# Patient Record
Sex: Female | Born: 1935 | Race: White | Hispanic: No | Smoking: Never smoker
Health system: Southern US, Community
[De-identification: ages and names within clinical notes are randomized; demographics above are authoritative.]

## PROBLEM LIST (undated history)

## (undated) DIAGNOSIS — K219 Gastro-esophageal reflux disease without esophagitis: Secondary | ICD-10-CM

## (undated) DIAGNOSIS — J8410 Pulmonary fibrosis, unspecified: Secondary | ICD-10-CM

## (undated) DIAGNOSIS — M069 Rheumatoid arthritis, unspecified: Secondary | ICD-10-CM

## (undated) DIAGNOSIS — E785 Hyperlipidemia, unspecified: Secondary | ICD-10-CM

## (undated) DIAGNOSIS — E119 Type 2 diabetes mellitus without complications: Secondary | ICD-10-CM

## (undated) DIAGNOSIS — C801 Malignant (primary) neoplasm, unspecified: Secondary | ICD-10-CM

## (undated) HISTORY — DX: Gastro-esophageal reflux disease without esophagitis: K21.9

## (undated) HISTORY — DX: Malignant (primary) neoplasm, unspecified: C80.1

## (undated) HISTORY — DX: Pulmonary fibrosis, unspecified: J84.10

## (undated) HISTORY — DX: Hyperlipidemia, unspecified: E78.5

---

## 1958-12-28 HISTORY — PX: APPENDECTOMY: SHX54

## 1999-02-04 ENCOUNTER — Ambulatory Visit (HOSPITAL_COMMUNITY): Admission: RE | Admit: 1999-02-04 | Discharge: 1999-02-04 | Payer: Self-pay | Admitting: Internal Medicine

## 1999-05-14 ENCOUNTER — Encounter: Payer: Self-pay | Admitting: Internal Medicine

## 1999-05-14 ENCOUNTER — Encounter: Admission: RE | Admit: 1999-05-14 | Discharge: 1999-05-14 | Payer: Self-pay | Admitting: Internal Medicine

## 2004-03-04 ENCOUNTER — Other Ambulatory Visit: Admission: RE | Admit: 2004-03-04 | Discharge: 2004-03-04 | Payer: Self-pay | Admitting: Internal Medicine

## 2004-06-07 ENCOUNTER — Encounter: Admission: RE | Admit: 2004-06-07 | Discharge: 2004-06-07 | Payer: Self-pay | Admitting: Internal Medicine

## 2005-06-27 ENCOUNTER — Encounter: Admission: RE | Admit: 2005-06-27 | Discharge: 2005-06-27 | Payer: Self-pay | Admitting: Internal Medicine

## 2006-06-30 ENCOUNTER — Encounter: Admission: RE | Admit: 2006-06-30 | Discharge: 2006-06-30 | Payer: Self-pay | Admitting: Internal Medicine

## 2008-08-28 ENCOUNTER — Encounter: Admission: RE | Admit: 2008-08-28 | Discharge: 2008-08-28 | Payer: Self-pay | Admitting: Internal Medicine

## 2008-09-19 ENCOUNTER — Encounter: Payer: Self-pay | Admitting: Internal Medicine

## 2008-09-26 ENCOUNTER — Encounter: Admission: RE | Admit: 2008-09-26 | Discharge: 2008-09-26 | Payer: Self-pay | Admitting: Allergy and Immunology

## 2008-09-28 ENCOUNTER — Encounter: Admission: RE | Admit: 2008-09-28 | Discharge: 2008-09-28 | Payer: Self-pay | Admitting: Allergy and Immunology

## 2008-10-12 ENCOUNTER — Ambulatory Visit: Payer: Self-pay | Admitting: Internal Medicine

## 2008-10-12 DIAGNOSIS — J8410 Pulmonary fibrosis, unspecified: Secondary | ICD-10-CM

## 2008-10-12 DIAGNOSIS — R059 Cough, unspecified: Secondary | ICD-10-CM | POA: Insufficient documentation

## 2008-10-12 DIAGNOSIS — R05 Cough: Secondary | ICD-10-CM

## 2008-10-13 ENCOUNTER — Telehealth: Payer: Self-pay | Admitting: Internal Medicine

## 2008-11-09 ENCOUNTER — Ambulatory Visit: Payer: Self-pay | Admitting: Internal Medicine

## 2009-05-24 ENCOUNTER — Ambulatory Visit: Payer: Self-pay | Admitting: Internal Medicine

## 2009-07-26 ENCOUNTER — Ambulatory Visit: Payer: Self-pay | Admitting: Adult Health

## 2009-07-26 ENCOUNTER — Ambulatory Visit: Payer: Self-pay | Admitting: Internal Medicine

## 2009-08-09 ENCOUNTER — Ambulatory Visit: Payer: Self-pay | Admitting: Internal Medicine

## 2009-08-09 DIAGNOSIS — R0609 Other forms of dyspnea: Secondary | ICD-10-CM

## 2009-08-09 DIAGNOSIS — R0989 Other specified symptoms and signs involving the circulatory and respiratory systems: Secondary | ICD-10-CM | POA: Insufficient documentation

## 2009-08-09 DIAGNOSIS — J9610 Chronic respiratory failure, unspecified whether with hypoxia or hypercapnia: Secondary | ICD-10-CM

## 2009-08-10 LAB — CONVERTED CEMR LAB
Pro B Natriuretic peptide (BNP): 68 pg/mL (ref 0.0–100.0)
Sed Rate: 13 mm/hr (ref 0–22)

## 2009-09-06 ENCOUNTER — Ambulatory Visit: Payer: Self-pay | Admitting: Internal Medicine

## 2009-09-06 DIAGNOSIS — K219 Gastro-esophageal reflux disease without esophagitis: Secondary | ICD-10-CM | POA: Insufficient documentation

## 2009-10-23 ENCOUNTER — Telehealth: Payer: Self-pay | Admitting: Internal Medicine

## 2009-10-25 ENCOUNTER — Ambulatory Visit: Payer: Self-pay | Admitting: Internal Medicine

## 2010-05-19 ENCOUNTER — Encounter: Payer: Self-pay | Admitting: Internal Medicine

## 2010-05-28 NOTE — Assessment & Plan Note (Signed)
Summary: Acute NP office visit - cough   Copy to:  Dr. Neldon Mc Primary Provider/Referring Provider:  Dr Mertha Finders  CC:  still coughing, prod in the mornings with clear mucus and dry throughout the day, and and dyspnea w/ activity and anxiety.  states the cough is intermittent.  she is taking the nexium and states this helps little.  History of Present Illness: 59 yowf never smoker mother of a [place] PA with h/o cough onset around 2005 with a pattern showing scarring per Dr Inda Merlin office records.  October 12, 2008 ov at request of Kozlow with worse cough winter 2010 and after prednisone seemed better except  still cough at hs.  completed 2 days before ov. also noted doe proportionate to cough which is mostly dry.  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 rec Please schedule a follow-up appointment in 1 month with any old chest xrays dating back 5 years with PFT's same day Stop fish oil continue omeprazole but take one 30-60 min before first meal of the day  for cough try delsym 2 tsp every 12   November 09, 2008 cough much better, doe minimal improvement certainly no worse. no sign arthritis or dysphagia.  rec continue omeprazole Take  one 30-60 min before first meal of the day  Please schedule a follow-up appointment in 3 months with CXR sooner if breathing or cough worsen NO MACRODANTIN  September 2010 flew to [place] one month after landed increase cough esp at bedtime better after rx which included European version of symbicort which is dpi.  Back to baseline doe, no cough.    04/2009--follow up. rx Nexium 42m once daily   .July 26, 2009 --Presents for acute office visit. Complains of still coughing, prod in the mornings with clear mucus and dry throughout the day,and dyspnea w/ activity and anxiety.  states the cough is intermittent.  she is taking the nexium and states this helps little.  Breathing is not as good as it has been. Cough is dry and intermittent.  Voice shaky. She has had on/off cough that has good and bad days, worse for last 2 weeks. Denies chest pain, dyspnea, orthopnea, hemoptysis, fever, n/v/d, edema, headache.   Preventive Screening-Counseling & Management  Alcohol-Tobacco     Smoking Status: never  Medications Prior to Update: 1)  Aspirin 81 Mg Tbec (Aspirin) ..Marland Kitchen. 1 Once Daily 2)  Sudafed 30 Mg Tabs (Pseudoephedrine Hcl) .... As Directed As Needed 3)  Symbicort 320/9 Mg ..Marland Kitchen. 1 Puff Two Times A Day 4)  Nexium 40 Mg Cpdr (Esomeprazole Magnesium) .... Take  One 30-60 Min Before First Meal of The Day 5)  Vicks Tabs .... As Needed  Current Medications (verified): 1)  Aspirin 81 Mg Tbec (Aspirin) ..Marland Kitchen. 1 Once Daily 2)  Sudafed 30 Mg Tabs (Pseudoephedrine Hcl) .... As Directed As Needed 3)  Symbicort 160-4.5 Mcg/act Aero (Budesonide-Formoterol Fumarate) .... Inhale 2 Puffs Two Times A Day 4)  Nexium 40 Mg Cpdr (Esomeprazole Magnesium) .... Take  One 30-60 Min Before First Meal of The Day 5)  Vicks Tabs .... As Needed  Allergies (verified): No Known Drug Allergies  Past History:  Past Medical History: Last updated: 05/24/2009 Pulmonary Fibrosis.................................................Marland Kitchenert     - PFT's November 09, 2008 VC 2.03 ( 92%) DLC0 52%     - Ex desat documented 11/09/08  Hyperlipidemia GERD  Family History: Last updated: 10/12/2008 Heart dz- Brother Neg PF  Social History: Last updated: 10/12/2008 Widowed Children  Retired- states that she was exposed to saw dust x 35 years. Never smoker No ETOH  Social History: Smoking Status:  never  Review of Systems      See HPI  Vital Signs:  Patient profile:   77 year old female Height:      60 inches Weight:      144.38 pounds BMI:     28.30 O2 Sat:      97 % on Room air Temp:     97.5 degrees F oral Pulse rate:   67 / minute BP sitting:   152 / 82  (left arm) Cuff size:   regular  Vitals Entered By: Parke Poisson CNA (July 26, 2009 10:46 AM)  O2  Flow:  Room air CC: still coughing, prod in the mornings with clear mucus and dry throughout the day,and dyspnea w/ activity and anxiety.  states the cough is intermittent.  she is taking the nexium and states this helps little Is Patient Diabetic? No Comments Medications reviewed with patient Daytime contact number verified with patient. Parke Poisson CNA  July 26, 2009 10:47 AM    Physical Exam  Additional Exam:  pleasant wf with strong [place] accent nad  wt 141 > 140 November 09, 2008 > 141 May 24, 2009 >>144 July 26, 2009>>144 07/26/09 HEENT: nl dentition, turbinates, and orophanx. Nl external ear canals without cough reflex NECK :  without JVD/Nodes/TM/ nl carotid upstrokes bilaterally LUNGS: no acc muscle use,  classic dry crackles on insp bases > apices  bilaterally, no cough on insp  CV:  RRR  no s3 or murmur or increase in P2, no edema   ABD:  soft and nontender with nl excursion in the supine position. No bruits or organomegaly, bowel sounds nl MS:  warm without deformities, calf tenderness, cyanosis or clubbing SKIN: warm and dry without lesions       Impression & Recommendations:  Problem # 1:  PULMONARY FIBROSIS (ICD-515) Upper airway cough flare w/ underlying PF, will check xray  REC:  Prednisone taper over next week.  Tessalon three times a day as needed cough Hydromet 1 tsp at bedtime as needed cough, this may make you sleepy  Please contact office for sooner follow up if symptoms do not improve or worsen  follow up Dr. Melvyn Novas in 2 weeks I will call with xray results.  Changed Nexium to Omeprazole 57m once daily (this is generic).   Medications Added to Medication List This Visit: 1)  Symbicort 160-4.5 Mcg/act Aero (Budesonide-formoterol fumarate) .... Inhale 2 puffs two times a day 2)  Omeprazole 20 Mg Cpdr (Omeprazole) ..Marland Kitchen. 1 by mouth once daily  (this replaces nexium) 3)  Prednisone 20 Mg Tabs (Prednisone) .... 4 tabs for 2 days, then 3 tabs for 2 days, 2 tabs  for 2 days, then 1 tab for 2 days, then stop 4)  Benzonatate 200 Mg Caps (Benzonatate) ..Marland Kitchen. 1 by mouth three times a day as needed cough 5)  Hydromet 5-1.5 Mg/5104mSyrp (Hydrocodone-homatropine) ...Marland Kitchen 1 tsp at bedtime as needed cough  Complete Medication List: 1)  Aspirin 81 Mg Tbec (Aspirin) ...Marland Kitchen 1 once daily 2)  Sudafed 30 Mg Tabs (Pseudoephedrine hcl) .... As directed as needed 3)  Symbicort 160-4.5 Mcg/act Aero (Budesonide-formoterol fumarate) .... Inhale 2 puffs two times a day 4)  Omeprazole 20 Mg Cpdr (Omeprazole) ...Marland Kitchen 1 by mouth once daily  (this replaces nexium) 5)  Vicks Tabs  .... As needed 6)  Prednisone 20 Mg Tabs (Prednisone) ...Marland KitchenMarland KitchenMarland Kitchen  4 tabs for 2 days, then 3 tabs for 2 days, 2 tabs for 2 days, then 1 tab for 2 days, then stop 7)  Benzonatate 200 Mg Caps (Benzonatate) .Marland Kitchen.. 1 by mouth three times a day as needed cough 8)  Hydromet 5-1.5 Mg/78m Syrp (Hydrocodone-homatropine) ..Marland Kitchen. 1 tsp at bedtime as needed cough  Other Orders: T-2 View CXR (71020TC) Est. Patient Level IV ((06269  Patient Instructions: 1)  Prednisone taper over next week.  2)  Tessalon three times a day as needed cough 3)  Hydromet 1 tsp at bedtime as needed cough, this may make you sleepy  4)  Please contact office for sooner follow up if symptoms do not improve or worsen  5)  follow up Dr. WMelvyn Novasin 2 weeks 6)  I will call with xray results.  7)  Changed Nexium to Omeprazole 231monce daily (this is generic).  Prescriptions: OMEPRAZOLE 20 MG CPDR (OMEPRAZOLE) 1 by mouth once daily  (this replaces Nexium)  #30 x 5   Entered and Authorized by:   TaRexene EdisonP   Signed by:   TaRexene EdisonP on 07/26/2009   Method used:   Electronically to        [facility]#[phone](retail)       [address]     [place]  [address]     Ph: [phone]     Fax: [phone] RxID:   [id number] 5-1.5 MG/5ML SYRP (HYDROCODONE-HOMATROPINE) 1  tsp at bedtime as needed cough  #8 oz x 0   Entered and Authorized by:   TaRexene EdisonP   Signed by:   Tammy Parrett NP on 07/26/2009   Method used:   Print then Give to Patient   RxID:   [id number] 200 MG CAPS (BENZONATATE) 1 by mouth three times a day as needed cough  #30 x 0   Entered and Authorized by:   TaRexene EdisonP   Signed by:   TaRexene EdisonP on 07/26/2009   Method used:   Electronically to        [facility]#[phone](retail)       [address]     [place]  [address]     Ph: [phone]     Fax: [phone] RxID:   [id number] 20 MG TABS (PREDNISONE) 4 tabs for 2 days, then 3 tabs for 2 days, 2 tabs for 2 days, then 1 tab for 2 days, then stop  #20 x 0   Entered and Authorized by:   TaRexene EdisonP   Signed by:   TaRexene EdisonP on 07/26/2009   Method used:   Electronically to        [facility]#[phone](retail)       [address]     [place]  [address]     Ph: [phone]     Fax: [phone] RxID:   [id number]

## 2010-05-28 NOTE — Assessment & Plan Note (Signed)
Summary: Pulmonary/ f/u pf with 02 sat walking =87 after 1 lap   Copy to:  Dr. Neldon Mc Primary Provider/Referring Provider:  Dr Mertha Finders  CC:  2 wk followup.  Pt states that her cough has resolved  and sleeping better at night and also her breathing has improved.  She c/o "mucus stuck in throat" in the am..  History of Present Illness: 52 yowf never smoker mother of a [place] PA with h/o cough onset around 2005 with a pattern showing scarring per Dr Inda Merlin office records.  October 12, 2008 ov at request of Kozlow with worse cough winter 2010 and after prednisone seemed better except  still cough at hs.  completed 2 days before ov. also noted doe proportionate to cough which is mostly dry.  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 rec Please schedule a follow-up appointment in 1 month with any old chest xrays dating back 5 years with PFT's same day Stop fish oil continue omeprazole but take one 30-60 min before first meal of the day  for cough try delsym 2 tsp every 12   November 09, 2008 cough much better, doe minimal improvement certainly no worse. no sign arthritis or dysphagia.  rec continue omeprazole Take  one 30-60 min before first meal of the day  Please schedule a follow-up appointment in 3 months with CXR sooner if breathing or cough worsen NO MACRODANTIN  September 2010 flew to [place] one month after landed increase cough esp at bedtime better after rx which included European version of symbicort which is dpi.  Back to baseline doe, no cough.     July 26, 2009 -cc  worse x sev weeks  coughing, prod in the mornings with clear mucus and dry throughout the day,and dyspnea w/ activity and anxiety.  states the cough is intermittent.  she is taking the nexium and states this helps little.  Breathing is not as good as it has been. Cough is dry and intermittent. Voice shaky. She has had on/off cough that has good and bad days, worse for last 2 weeks.  rec short  course prednisone and cough suppression >  back to baseline.  August 09, 2009 2 wk followup.  Pt states that her cough has resolved , sleeping better at night and also her breathing has improved.  She c/o "mucus stuck in throat" in the am. no worse off pred. Pt denies any significant sore throat, dysphagia, itching, sneezing,  nasal congestion or excess secretions,  fever, chills, sweats, wt loss, cp.  Current Medications (verified): 1)  Aspirin 81 Mg Tbec (Aspirin) .Marland Kitchen.. 1 Once Daily 2)  Sudafed 30 Mg Tabs (Pseudoephedrine Hcl) .... As Directed As Needed 3)  Symbicort 160-4.5 Mcg/act Aero (Budesonide-Formoterol Fumarate) .... Inhale 2 Puffs Two Times A Day As Needed 4)  Omeprazole 20 Mg Cpdr (Omeprazole) .Marland Kitchen.. 1 By Mouth Once Daily  (This Replaces Nexium) 5)  Vicks Tabs .... As Needed  Allergies (verified): No Known Drug Allergies  Past History:  Past Medical History: Pulmonary Fibrosis..................................................Marland KitchenWert     - PFT's November 09, 2008 VC 2.03 ( 92%) DLC0 52%     - Ex desat documented 11/09/08  Hyperlipidemia GERD  Vital Signs:  Patient profile:   77 year old female Weight:      144 pounds O2 Sat:      93 % on Room air Temp:     97.9 degrees F oral Pulse rate:   69 / minute BP sitting:  122 / 70  (left arm)  Vitals Entered By: Tilden Dome (August 09, 2009 10:27 AM)  O2 Flow:  Room air  Serial Vital Signs/Assessments:  Comments: 10:47 AM Ambulatory Pulse Oximetry  Resting; HR__73___    02 Sat__95%ra___  Lap1 (185 feet)   HR__101___   02 Sat__87%ra___ Lap2 (185 feet)   HR_____   02 Sat_____    Lap3 (185 feet)   HR_____   02 Sat_____  ___Test Completed without Difficulty _x_Test Stopped due to:o2 sat decreased    By: Tilden Dome    Physical Exam  Additional Exam:  pleasant wf with strong [place] accent nad   wt 141 > 140 November 09, 2008 > 141 May 24, 2009 >>144 July 26, 2009>>144 07/26/09> 144 August 09, 2009  HEENT: nl  dentition, turbinates, and orophanx. Nl external ear canals without cough reflex NECK :  without JVD/Nodes/TM/ nl carotid upstrokes bilaterally LUNGS: no acc muscle use,  classic dry crackles on insp bases > apices  bilaterally, no cough on insp  CV:  RRR  no s3 or murmur or increase in P2, no edema   ABD:  soft and nontender with nl excursion in the supine position. No bruits or organomegaly, bowel sounds nl MS:  warm without deformities, calf tenderness, cyanosis or clubbing SKIN: warm and dry without lesions      B-Type Natriuetic Peptide                             68.0 pg/mL                  0.0-100.0  Tests: (2) Sed Rate (ESR)   Sed Rate                  13 mm/hr                    0-22  Impression & Recommendations:  Problem # 1:  PULMONARY FIBROSIS (ICD-515) DDx for pulmonary fibrosis with honeycombing includes idiopathic pulmonary fibrosis, pulmonary fibrosis associated with rheumatologic disease, adverse effect from  drugs such as chemotherapy or amiodarone exposure, nonspecific interstitial pneumonia which is typically steroid responsive, and chronic hypersensitivity pneumonitis.    Steroid response noted but The goal with a chronic steroid dependent illness is always arriving at the lowest effective dose that controls the disease/symptoms and not accepting a set "formula" which is based on statistics that don't take into accound individual variability or the natural hx of the dz in every individual patient, which may well vary over time. For now let's try to keep the floor at zero and max gerd rx to reduce risk to airways and parenchyma.  See instructions for specific recommendations   Problem # 2:  COUGH (ICD-786.2) Upper airway cough syndrome suggested by previous response to gerd rx and absense of cough with insp  UACS  so named because it's frequently impossible to sort out how much is  CR/sinusitis with freq throat clearing generating secondary extra esophageal GERD from  wide swings in gastric pressure that occur with throat clearing, promoting self use of mint and menthol lozenges that reduce the lower esophageal sphincter tone and exacerbate the problem further.  These symptoms are easily confused with asthma/copd by even experienced pulmonogists because they overlap so much. These are the same pts who not infrequently have failed to tolerate ace inhibitors,  dry powder inhalers or biphosphonates or report having reflux symptoms that don't  respond to standard doses of PPI  See instructions for specific recommendations   Problem # 3:  RESPIRATORY FAILURE, CHRONIC (ICD-518.83) No change in baseline sat since 10/2008 albeit after a short course of steroids, so when returns for pft's repeat to see if loosing ground off steroids.  Medications Added to Medication List This Visit: 1)  Symbicort 160-4.5 Mcg/act Aero (Budesonide-formoterol fumarate) .... Inhale 2 puffs two times a day as needed 2)  Pepcid Ac Maximum Strength 20 Mg Tabs (Famotidine) .... One at bedtime  Complete Medication List: 1)  Aspirin 81 Mg Tbec (Aspirin) .Marland Kitchen.. 1 once daily 2)  Sudafed 30 Mg Tabs (Pseudoephedrine hcl) .... As directed as needed 3)  Symbicort 160-4.5 Mcg/act Aero (Budesonide-formoterol fumarate) .... Inhale 2 puffs two times a day as needed 4)  Omeprazole 20 Mg Cpdr (Omeprazole) .Marland Kitchen.. 1 by mouth once daily  (this replaces nexium) 5)  Vicks Tabs  .... As needed 6)  Pepcid Ac Maximum Strength 20 Mg Tabs (Famotidine) .... One at bedtime  Other Orders: Pulse Oximetry, Ambulatory (17408) Prescription Created Electronically [phone]) Est. Patient Level IV (85631) TLB-BNP (B-Natriuretic Peptide) (83880-BNPR) TLB-Sedimentation Rate (ESR) (85652-ESR)  Patient Instructions: 1)  GERD (REFLUX)  is a common cause of respiratory symptoms. It commonly presents without heartburn and can be treated with medication, but also with lifestyle changes including avoidance of late meals, excessive  alcohol, smoking cessation, and avoid fatty foods, chocolate, peppermint, colas, red wine, and acidic juices such as orange juice. NO MINT OR MENTHOL PRODUCTS SO NO COUGH DROPS  2)  USE SUGARLESS CANDY INSTEAD (jolley ranchers)  3)  NO OIL BASED VITAMINS  4)  Continue omeprazole Take  one 30-60 min before first meal of the day and add pepcid 20 otc one at bedtime 5)  Please schedule a follow-up appointment in 6 weeks per PFT's but call in meantime if breaithing worsens  Prescriptions: PEPCID AC MAXIMUM STRENGTH 20 MG TABS (FAMOTIDINE) one at bedtime  #34 x 11   Entered and Authorized by:   Tanda Rockers MD   Signed by:   Tanda Rockers MD on 08/09/2009   Method used:   Electronically to        [facility]  [phone]* (retail)       [address]       [place], [place]  [address]       Ph: [phone] or [phone]       Fax: [phone]   RxID:   [id number]

## 2010-05-28 NOTE — Progress Notes (Signed)
Summary: nos appt  Phone Note Call from Patient   Caller: Donna Watts_0  Call For: Donna Watts Summary of Call: Rsc nos from 6/27 to 6/30 @ 10a (for pft) and 11a for ov. Initial call taken by: Netta Neat,  October 23, 2009 10:45 AM

## 2010-05-28 NOTE — Assessment & Plan Note (Signed)
Summary: Pulmonary/ summary f/u ov ? better on symbicort   Copy to:  Dr. Neldon Mc Primary Provider/Referring Provider:  Dr Mertha Finders  CC:  Followup.  Pt denies any complaints today.  Breathing is fine.Marland Kitchen  History of Present Illness: 76 yowf never smoker mother of a [place] PA with h/o cough onset around 2005 with a pattern showing scarring per Dr Inda Merlin office records.  October 12, 2008 ov at request of [place] with worse cough winter 2010 and after prednisone seemed better except  still cough at hs.  completed 2 days before ov. also noted doe proportionate to cough which is mostly dry.  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 rec Please schedule a follow-up appointment in 1 month with any old chest xrays dating back 5 years with PFT's same day Stop fish oil continue omeprazole but take one 30-60 min before first meal of the day  for cough try delsym 2 tsp every 12   November 09, 2008 cough much better, doe minimal improvement certainly no worse. no sign arthritis or dysphagia.  rec continue omeprazole Take  one 30-60 min before first meal of the day  Please schedule a follow-up appointment in 3 months with CXR sooner if breathing or cough worsen NO MACRODANTIN  September 2010 flew to [place] one month after landed increase cough esp at bedtime better after rx which included European version of symbicort which is dpi.  Back to baseline doe, no cough.  Pt denies any significant sore throat, dysphagia, itching, sneezing,  nasal congestion or excess secretions,  fever, chills, sweats, unintended wt loss, pleuritic or exertional cp, hempoptysis, change in activity tolerance  orthopnea pnd or leg swelling Pt also denies any obvious fluctuation in symptoms with weather or environmental change or other alleviating or aggravating factors.          Current Medications (verified): 1)  Aspirin 81 Mg Tbec (Aspirin) .Marland Kitchen.. 1 Once Daily 2)  Sudafed 30 Mg Tabs (Pseudoephedrine Hcl)  .... As Directed As Needed 3)  Symbicort 320/9 Mg .Marland Kitchen.. 1 Puff Two Times A Day 4)  Nexium 40 Mg Cpdr (Esomeprazole Magnesium) .Marland Kitchen.. 1 Before Breakfast Every Day 5)  Vicks Tabs .... As Needed  Allergies (verified): No Known Drug Allergies  Past History:  Past Medical History: Pulmonary Fibrosis................................................Marland KitchenWert     - PFT's November 09, 2008 VC 2.03 ( 92%) DLC0 52%     - Ex desat documented 11/09/08  Hyperlipidemia GERD  Vital Signs:  Patient profile:   77 year old female Weight:      141 pounds O2 Sat:      91 % on Room air Temp:     98.0 degrees F oral Pulse rate:   78 / minute BP sitting:   124 / 78  (left arm)  Vitals Entered By: Tilden Dome (May 24, 2009 12:08 PM)  O2 Flow:  Room air  Serial Vital Signs/Assessments:  Comments: 12:27 PM Ambulatory Pulse Oximetry  Resting; HR__86___    02 Sat__95%ra___  Lap1 (185 feet)   HR___96__   02 Sat__84%ra___ Lap2 (185 feet)   HR_____   02 Sat_____    Lap3 (185 feet)   HR_____   02 Sat_____  ___Test Completed without Difficulty _x__Test Stopped due to: decreased o2 sat.   By: Tilden Dome    Physical Exam  Additional Exam:  pleasant wf with strong [place] accent nad  wt 141 > 140 November 09, 2008 > 141 May 24, 2009  HEENT:  nl dentition, turbinates, and orophanx. Nl external ear canals without cough reflex NECK :  without JVD/Nodes/TM/ nl carotid upstrokes bilaterally LUNGS: no acc muscle use,  classic dry crackles on insp bases > apices  bilaterally, no cough on insp  CV:  RRR  no s3 or murmur or increase in P2, no edema   ABD:  soft and nontender with nl excursion in the supine position. No bruits or organomegaly, bowel sounds nl MS:  warm without deformities, calf tenderness, cyanosis or clubbing SKIN: warm and dry without lesions       Impression & Recommendations:  Problem # 1:  PULMONARY FIBROSIS (ICD-515) Longstanding ? from previous macrodantin, better after rx in  [place] but did not include apparently any treatment for PF.  No evidence of airflow obstruction by pft's so ok to try off symbicort  DDx for pulmonary fibrosis with honeycombing includes idiopathic pulmonary fibrosis, pulmonary fibrosis associated with rheumatologic disease, adverse effect from  drugs such as chemotherapy or amiodarone exposure, nonspecific interstitial pneumonia which is typically steroid responsive, and chronic hypersensitivity pneumonitis.    F/u studies outline.   Each maintenance medication was reviewed in detail including most importantly the difference between maintenance and as needed and under what circumstances the prns are to be used. See instructions for specific recommendations   Problem # 2:  COUGH (ICD-786.2) ? if this is better from ppi or symbicort, try off symbicort using a reverse therapeutic trial concept/  Medications Added to Medication List This Visit: 1)  Symbicort 320/9 Mg  .Marland Kitchen.. 1 puff two times a day 2)  Nexium 40 Mg Cpdr (Esomeprazole magnesium) .Marland Kitchen.. 1 before breakfast every day 3)  Nexium 40 Mg Cpdr (Esomeprazole magnesium) .... Take  one 30-60 min before first meal of the day 4)  Vicks Tabs  .... As needed  Other Orders: Est. Patient Level IV (99214) Pulse Oximetry (75436)  Patient Instructions: 1)  GERD (gastroesophageal reflux disease) was discussed. It is a common cause of respiratory symptoms. It commonly presents in the absence of heartburn. GERD can be treated with medication, but also with lifestyle changes including avoidance of late meals, excessive alcohol, smoking cessation, and avoid fatty foods, chocolate, peppermint, colas, red wine, and acidic juices such as orange juice. NO MINT OR MENTHOL PRODUCTS(no cough drops) 2)  USE SUGARLESS CANDY INSTEAD (jolley ranchers)  3)  ok to use symbicort if needed up to every 12 hours 4)  Return to office in 3 months with pft's and cxr  Prescriptions: NEXIUM 40 MG CPDR (ESOMEPRAZOLE MAGNESIUM)  Take  one 30-60 min before first meal of the day  #30 x 11   Entered and Authorized by:   Tanda Rockers MD   Signed by:   Tanda Rockers MD on 05/24/2009   Method used:   Electronically to        [facility]  [phone]* (retail)       [address]       [place], [place]  [address]       Ph: [phone] or [phone]       Fax: [phone]   RxID:   [id number]

## 2010-05-28 NOTE — Assessment & Plan Note (Signed)
Summary: Pulmonary/ ext summary f/u ov   Copy to:  Dr. Neldon Mc Primary Provider/Referring Provider:  Dr Mertha Finders  CC:  Followup with PFT's.  Pt states that her cough is much better.  She still has occ cough at night- prod with clear sputum.  No new complaints today.Donna Watts  History of Present Illness: 25 yowf never smoker mother of a [name] PA with h/o cough onset around 2005 with a cxr showing scarring per Dr Inda Merlin office records.  October 12, 2008 ov at request of [place] with worse cough winter 2010 and after prednisone seemed better except  still cough at hs.  mostly dry.  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 rec  Stop fish oil continue omeprazole but take one 30-60 min before first meal of the day  for cough try delsym 2 tsp every 12   November 09, 2008 cough much better, doe minimal improvement certainly no worse. no sign arthritis or dysphagia.  rec continue omeprazole Take  one 30-60 min before first meal of the day  Please schedule a follow-up appointment in 3 months with CXR sooner if breathing or cough worsen NO MACRODANTIN  September 2010 flew to [place] one month after landed increase cough esp at bedtime better after rx which included European version of symbicort which is dpi.  Back to baseline doe, no cough.     July 26, 2009 -cc  worse x sev weeks  coughing, prod in the mornings with clear mucus and dry throughout the day,and dyspnea w/ activity and anxiety.  states the cough is intermittent.  she is taking the nexium and states this helps little.  Breathing is not as good as it has been. Cough is dry and intermittent. Voice shaky. She has had on/off cough that has good and bad days, worse for last 2 weeks.  rec short course prednisone and cough suppression >  back to baseline. no change rx  Sep 06, 2009  c/o cough x 3 days.  Cough is prod in the am with white sputum.  She states that cough gets worse after eating or going outside.  Tessalon helps  cough.   better off pepcid  October 25, 2009 Followup with PFT's.  Pt states that her cough is much better.  She still has occ cough at night- prod with clear sputum.  No new complaints today. Pt denies any significant sore throat, dysphagia, itching, sneezing,  nasal congestion or excess secretions,  fever, chills, sweats, unintended wt loss, pleuritic or ex cp  Current Medications (verified): 1)  Aspirin 81 Mg Tbec (Aspirin) .Donna Watts.. 1 Once Daily 2)  Symbicort 160-4.5 Mcg/act Aero (Budesonide-Formoterol Fumarate) .... Inhale 2 Puffs Two Times A Day As Needed 3)  Omeprazole 20 Mg Cpdr (Omeprazole) .Donna Watts.. 1 By Mouth Once Daily  (This Replaces Nexium) 4)  Vicks Tabs .... As Needed 5)  Sudafed 30 Mg Tabs (Pseudoephedrine Hcl) .... As Directed As Needed 6)  Tessalon 200 Mg Caps (Benzonatate) .... One Three Times A Day If Needed 7)  Simvastatin 20 Mg Tabs (Simvastatin) .Donna Watts.. 1 Once Daily 8)  Losartan Potassium 50 Mg Tabs (Losartan Potassium) .Donna Watts.. 1 Once Daily  Allergies (verified): No Known Drug Allergies  Past History:  Past Medical History: Pulmonary Fibrosis..................................................Donna KitchenWert     - PFT's November 09, 2008 VC 2.03 ( 92%) DLC0 52%     - PFT's October 25, 2009 VC 2.0 (92) DLC0 58%     - Ex desat documented 11/09/08 x 1  lap > x 2 laps October 25, 2009  Hyperlipidemia GERD    - ? rx to pepcid > try off Sep 07, 2009 better  Vital Signs:  Patient profile:   77 year old female Weight:      135 pounds O2 Sat:      93 % on Room air Temp:     97.6 degrees F oral Pulse rate:   61 / minute BP sitting:   126 / 60  (left arm)  Vitals Entered By: Tilden Dome (October 25, 2009 11:01 AM)  O2 Flow:  Room air  Serial Vital Signs/Assessments:  Comments: Ambulatory Pulse Oximetry  Resting; HR__82___    02 Sat__95%RA___  Lap1 (185 feet)   HR__88___   02 Sat_95%RA____ Lap2 (185 feet)   HR_____   02 Sat_____    Lap3 (185 feet)   HR_____   02 Sat_____  ___Test Completed  without Difficulty _X__Test Stopped due to: Pt desat to 84% on the second lap and she was put on 2 liters of oxygent and she recovered to 92% in 2 mintues [facility]  October 25, 2009 12:03 PM     By: Charma Igo    Physical Exam  Additional Exam:  pleasant wf with strong [place] accent nad   wt 141 > 140 November 09, 2008 > 141 May 24, 2009 >>144 July 26, 2009>>144 07/26/09> 144 August 09, 2009 > 135 October 25, 2009  HEENT: nl dentition, turbinates, and orophanx. Nl external ear canals without cough reflex NECK :  without JVD/Nodes/TM/ nl carotid upstrokes bilaterally LUNGS: no acc muscle use,  classic dry crackles on insp bases > apices  bilaterally, no cough on insp  CV:  RRR  no s3 or murmur or increase in P2, no edema   ABD:  soft and nontender with nl excursion in the supine position. No bruits or organomegaly, bowel sounds nl MS:  warm without deformities, calf tenderness, cyanosis or clubbing SKIN: warm and dry without lesions       Impression & Recommendations:  Problem # 1:  PULMONARY FIBROSIS (ICD-515) DDx for pulmonary fibrosis with honeycombing includes idiopathic pulmonary fibrosis, pulmonary fibrosis associated with rheumatologic disease, adverse effect from  drugs such as chemotherapy or amiodarone exposure, nonspecific interstitial pneumonia which is typically steroid responsive, and chronic hypersensitivity pneumonitis.    Steroid response noted in past  but The goal with a chronic steroid dependent illness is always arriving at the lowest effective dose that controls the disease/symptoms and not accepting a set "formula" which is based on statistics that don't take into accound individual variability or the natural hx of the dz in every individual patient, which may well vary over time. For now  try to keep the floor at zero and max gerd rx to reduce risk to airways and parenchyma.  See instructions for specific recommendations  - serial pft's and 02 sats walking best way  to follow natural hx of this process which appears to be unchanged x one year f/u  Problem # 2:  RESPIRATORY FAILURE, CHRONIC (ICD-518.83)  Orders: Pulse Oximetry, Ambulatory (02585)  Pacing discussed to prevenent desaturation since wants to avoid 02  Medications Added to Medication List This Visit: 1)  Simvastatin 20 Mg Tabs (Simvastatin) .Donna Watts.. 1 once daily 2)  Losartan Potassium 50 Mg Tabs (Losartan potassium) .Donna Watts.. 1 once daily  Other Orders: Est. Patient Level IV (27782)  Patient Instructions: 1)  pace yourself - don't push as hard as you did for [place] today  2)  Return to office in 6  months, sooner if needed

## 2010-05-28 NOTE — Assessment & Plan Note (Signed)
Summary: Pulmonary/ f//u PF     Copy to:  Dr. Neldon Mc Primary Provider/Referring Provider:  Dr Mertha Finders  CC:  Acute visit.  Pt c/o cough x 3 days.  Cough is prod in the am with white sputum.  She states that cough gets worse after eating or going outside.  Tessalon helps cough. She c/o feeling itchy and uncomfortable after she takes the pepcid at bedtime.Marland Kitchen  History of Present Illness: 26 yowf never smoker mother of a [place] PA with h/o cough onset around 2005 with a pattern showing scarring per Dr Inda Merlin office records.  October 12, 2008 ov at request of [place] with worse cough winter 2010 and after prednisone seemed better except  still cough at hs.  mostly dry.  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 rec  Stop fish oil continue omeprazole but take one 30-60 min before first meal of the day  for cough try delsym 2 tsp every 12   November 09, 2008 cough much better, doe minimal improvement certainly no worse. no sign arthritis or dysphagia.  rec continue omeprazole Take  one 30-60 min before first meal of the day  Please schedule a follow-up appointment in 3 months with CXR sooner if breathing or cough worsen NO MACRODANTIN  September 2010 flew to [place] one month after landed increase cough esp at bedtime better after rx which included European version of symbicort which is dpi.  Back to baseline doe, no cough.     July 26, 2009 -cc  worse x sev weeks  coughing, prod in the mornings with clear mucus and dry throughout the day,and dyspnea w/ activity and anxiety.  states the cough is intermittent.  she is taking the nexium and states this helps little.  Breathing is not as good as it has been. Cough is dry and intermittent. Voice shaky. She has had on/off cough that has good and bad days, worse for last 2 weeks.  rec short course prednisone and cough suppression >  back to baseline. no change rx  Sep 06, 2009  c/o cough x 3 days.  Cough is prod in the am with  white sputum.  She states that cough gets worse after eating or going outside.  Tessalon helps cough. She c/o feeling itchy and uncomfortable after she takes the pepcid at bedtime.  Pt denies any significant sore throat, dysphagia, itching, sneezing,  nasal congestion or excess secretions,  fever, chills, sweats, unintended wt loss, pleuritic or exertional cp, hempoptysis, change in activity tolerance  orthopnea pnd or leg swelling.    Current Medications (verified): 1)  Aspirin 81 Mg Tbec (Aspirin) .Marland Kitchen.. 1 Once Daily 2)  Symbicort 160-4.5 Mcg/act Aero (Budesonide-Formoterol Fumarate) .... Inhale 2 Puffs Two Times A Day As Needed 3)  Omeprazole 20 Mg Cpdr (Omeprazole) .Marland Kitchen.. 1 By Mouth Once Daily  (This Replaces Nexium) 4)  Pepcid Ac Maximum Strength 20 Mg Tabs (Famotidine) .... One At Bedtime 5)  Vicks Tabs .... As Needed 6)  Sudafed 30 Mg Tabs (Pseudoephedrine Hcl) .... As Directed As Needed  Allergies (verified): No Known Drug Allergies  Past History:  Past Medical History: Pulmonary Fibrosis..................................................Marland KitchenWert     - PFT's November 09, 2008 VC 2.03 ( 92%) DLC0 52%     - Ex desat documented 11/09/08 >  Hyperlipidemia GERD    - ? rx to pepcid > try off Sep 07, 2009   Vital Signs:  Patient profile:   77 year old female Weight:  139 pounds O2 Sat:      95 % on Room air Temp:     97.7 degrees F oral Pulse rate:   65 / minute BP sitting:   136 / 78  (left arm)  Vitals Entered By: Stefani Dama RCP, LPN (Sep 07, 2503 39:76 AM)  O2 Flow:  Room air   CXR  Procedure date:  09/06/2009  Findings:      Comparison: 07/26/2009.  CT of 09/28/2008.   Findings: Mild osteopenia.  Moderate S-shaped spinal curvature. Midline trachea.  Normal heart size.  Similar configuration, back to 09/26/2008 of borderline right hilar soft tissue fullness.  This is felt to be secondary to architectural distortion within the right lung and small right hilar lymph  nodes, when correlated with prior chest CT.   No pleural effusion or pneumothorax.  Similar configuration of right greater than left lower lobe predominant subpleural reticulation with decreased lung volumes.   IMPRESSION:   1.  Similar configuration of interstitial lung disease, most consistent with a usual interstitial pneumonitis pattern. 2.  No evidence of acute superimposed process. 3.  Borderline right hilar prominence, similar over numerous prior exams.  Please see discussion above  Impression & Recommendations:  Problem # 1:  PULMONARY FIBROSIS (ICD-515) DDx for pulmonary fibrosis with honeycombing includes idiopathic pulmonary fibrosis, pulmonary fibrosis associated with rheumatologic disease, adverse effect from  drugs such as chemotherapy or amiodarone exposure, nonspecific interstitial pneumonia which is typically steroid responsive, and chronic hypersensitivity pneumonitis.    Steroid response noted but The goal with a chronic steroid dependent illness is always arriving at the lowest effective dose that controls the disease/symptoms and not accepting a set "formula" which is based on statistics that don't take into accound individual variability or the natural hx of the dz in every individual patient, which may well vary over time. For now  try to keep the floor at zero and max gerd rx to reduce risk to airways and parenchyma.  See instructions for specific recommendations  - serial pft's and 02 sats walking best way to follow natural hx of this process  Problem # 2:  GERD (ICD-530.81)  The following medications were removed from the medication list:    Pepcid Ac Maximum Strength 20 Mg Tabs (Famotidine) ..... One at bedtime Her updated medication list for this problem includes:    Omeprazole 20 Mg Cpdr (Omeprazole) .Marland Kitchen... 1 by mouth once daily  (this replaces nexium)  Clinical dx only and note the assoc between PF and GERD is statistical not cause and effect.  ? noct  symptoms from pepcid, try off  Medications Added to Medication List This Visit: 1)  Tessalon 200 Mg Caps (Benzonatate) .... One three times a day if needed  Other Orders: Prescription Created Electronically [phone]) Est. Patient Level III (37902) T-2 View CXR (71020TC)  Patient Instructions: 1)  Be sure your next visit is w/in 6 weeks with PFT's 2)  Stop pepcid to see if your night time symptoms improve, if so  leave it off, if not, call me Prescriptions: TESSALON 200 MG CAPS (BENZONATATE) one three times a day if needed  #90 x 11   Entered and Authorized by:   Tanda Rockers MD   Signed by:   Tanda Rockers MD on 09/06/2009   Method used:   Electronically to        [facility]  [phone]* (retail)       [address]  [place], [place]  [address]       Ph: [phone] or [phone]       Fax: [phone]   RxID:   [id number]    CXR  Procedure date:  09/06/2009  Findings:      Comparison: 07/26/2009.  CT of 09/28/2008.   Findings: Mild osteopenia.  Moderate S-shaped spinal curvature. Midline trachea.  Normal heart size.  Similar configuration, back to 09/26/2008 of borderline right hilar soft tissue fullness.  This is felt to be secondary to architectural distortion within the right lung and small right hilar lymph nodes, when correlated with prior chest CT.   No pleural effusion or pneumothorax.  Similar configuration of right greater than left lower lobe predominant subpleural reticulation with decreased lung volumes.   IMPRESSION:   1.  Similar configuration of interstitial lung disease, most consistent with a usual interstitial pneumonitis pattern. 2.  No evidence of acute superimposed process. 3.  Borderline right hilar prominence, similar over numerous prior exams.  Please see discussion above

## 2010-05-28 NOTE — Miscellaneous (Signed)
Summary: Orders Update pft charges  Clinical Lists Changes  Orders: Added new Service order of Carbon Monoxide diffusing w/capacity (94720) - Signed Added new Service order of Lung Volumes (94240) - Signed Added new Service order of Spirometry (Pre & Post) (94060) - Signed 

## 2010-08-08 ENCOUNTER — Ambulatory Visit (INDEPENDENT_AMBULATORY_CARE_PROVIDER_SITE_OTHER): Payer: Medicare Other | Admitting: Internal Medicine

## 2010-08-08 ENCOUNTER — Encounter: Payer: Self-pay | Admitting: Internal Medicine

## 2010-08-08 ENCOUNTER — Ambulatory Visit (INDEPENDENT_AMBULATORY_CARE_PROVIDER_SITE_OTHER)
Admission: RE | Admit: 2010-08-08 | Discharge: 2010-08-08 | Disposition: A | Discharge status: Home / Self Care | Payer: Medicare Other | Source: Ambulatory Visit | Attending: Internal Medicine | Admitting: Internal Medicine

## 2010-08-08 VITALS — BP 126/70 | HR 93 | Temp 97.7°F | Ht 60.0 in | Wt 140.0 lb

## 2010-08-08 DIAGNOSIS — J8410 Pulmonary fibrosis, unspecified: Secondary | ICD-10-CM

## 2010-08-08 DIAGNOSIS — R059 Cough, unspecified: Secondary | ICD-10-CM

## 2010-08-08 DIAGNOSIS — R05 Cough: Secondary | ICD-10-CM

## 2010-08-08 MED ORDER — OMEPRAZOLE 20 MG PO CPDR: DELAYED_RELEASE_CAPSULE | ORAL | Status: DC

## 2010-08-08 MED ORDER — BUDESONIDE-FORMOTEROL FUMARATE 160-4.5 MCG/ACT IN AERO: INHALATION_SPRAY | RESPIRATORY_TRACT | Status: DC

## 2010-08-08 MED ORDER — PREDNISONE (PAK) 10 MG PO TABS: ORAL_TABLET | ORAL | Status: AC

## 2010-08-08 NOTE — Progress Notes (Signed)
  Subjective:    Patient ID: Donna Watts, female    DOB: [birth year], 77 y.o.   MRN: [id number]  HPI  Primary Provider/Referring Provider: Dr Mertha Finders    56 yowf never smoker mother of a [place] PA with h/o cough onset around 2005 with a cxr showing scarring per Dr Inda Merlin office records.   October 12, 2008 ov at request of [place]oz[place] with worse cough winter 2010 and after prednisone seemed better except still cough at hs. mostly dry. 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 rec  Stop fish oil  continue omeprazole but take one 30-60 min before first meal of the day  for cough try delsym 2 tsp every 12   November 09, 2008 cough much better, doe minimal improvement certainly no worse. no sign arthritis or dysphagia.  rec continue omeprazole Take one 30-60 min before first meal of the day  Please schedule a follow-up appointment in 3 months with CXR sooner if breathing or cough worsen  NO MACRODANTIN   September 2010 flew to [place] one month after landed increase cough esp at bedtime better after rx which included European version of symbicort which is dpi. Back to baseline doe, no cough.      October 25, 2009 Followup with PFT's. Pt states that her cough is much better. She still has occ cough at night- prod with clear sputum.  No change in rx  08/08/2010 ov fine until October while in [place] p sev weeks and persisted since then varies, some worse when eat. Did see [place] doctor and arrived back in [place] x 48hours, has flonase. Minimal mucoid sputum. Also coughing at hs, some better with symbiocrt.   Pt denies any significant sore throat, dysphagia, itching, sneezing,  nasal congestion or excess/ purulent secretions,  fever, chills, sweats, unintended wt loss, pleuritic or exertional cp, hempoptysis, orthopnea pnd or leg swelling.    Also denies any obvious fluctuation of symptoms with weather or environmental changes or other aggravating or alleviating factors.           Past Medical History:  Pulmonary Fibrosis..................................................Marland KitchenWert  - PFT's November 09, 2008 VC 2.03 ( 92%) DLC0 52%  - PFT's October 25, 2009 VC 2.0 (92) DLC0 58%  - Ex desat documented 11/09/08 x 1 lap > 84% p  2 laps October 25, 2009  Hyperlipidemia  GERD  - ? rx to pepcid > try off Sep 07, 2009 better           Review of Systems     Objective:   Physical Exam wt   140 November 09, 2008 > 141 May 24, 2009 >>144 July 26, 2009> 140 08/08/2010  HEENT: nl dentition, turbinates, and orophanx. Nl external ear canals without cough reflex  NECK : without JVD/Nodes/TM/ nl carotid upstrokes bilaterally  LUNGS: no acc muscle use, classic dry crackles on insp bases > apices bilaterally, no cough on insp  CV: RRR no s3 or murmur or increase in P2, no edema  ABD: soft and nontender with nl excursion in the supine position. No bruits or organomegaly, bowel sounds nl  MS: warm without deformities, calf tenderness, cyanosis or clubbing  Assessment & Plan:

## 2010-08-08 NOTE — Assessment & Plan Note (Signed)
The most common causes of chronic cough in immunocompetent adults include the following: upper airway cough syndrome (UACS), previously referred to as postnasal drip syndrome (PNDS), which is caused by variety of rhinosinus conditions; (2) asthma; (3) GERD; (4) chronic bronchitis from cigarette smoking or other inhaled environmental irritants; (5) nonasthmatic eosinophilic bronchitis; and (6) bronchiectasis.   These conditions, singly or in combination, have accounted for up to 94% of the causes of chronic cough in prospective studies.   Other conditions have constituted no >6% of the causes in prospective studies These have included bronchogenic carcinoma, chronic interstitial pneumonia, sarcoidosis, left ventricular failure, ACEI-induced cough, and aspiration from a condition associated with pharyngeal dysfunction.   .Chronic cough is often simultaneously caused by more than one condition. A single cause has been found from 38 to 82% of the time, multiple causes from 18 to 62%. Multiply caused cough has been the result of three diseases up to 42% of the time.    Believe there are multiple mechanisms at play here including upper airway cough syndrome and maybe cough variant asthma as suggested by response to symbicort, albeit with poor hfa and misunderstanding of dosing  The proper method of use, as well as anticipated side effects, of this metered-dose inhaler are discussed and demonstrated to the patient. This improved to 50% with coaching

## 2010-08-08 NOTE — Assessment & Plan Note (Signed)
DDx for pulmonary fibrosis  includes idiopathic pulmonary fibrosis, pulmonary fibrosis associated with rheumatologic diseases (which have a relatively benign course in most cases) , adverse effect from  drugs such as chemotherapy or amiodarone exposure, nonspecific interstitial pneumonia which is typically steroid responsive, and chronic hypersensitivity pneumonitis.   In active  smokers Langerhan's Cell  Histiocyctosis (eosinophilic granuomatosis),  DIP,  and Respiratory Bronchiolitis ILD also need to be considered,    Would like to avoid steroids x in low doses in this setting  Use of PPI is associated with improved survival time and with decreased radiologic fibrosis per King's study published in AJRCCM vol 184 p1390.  Dec 2011  This may not be cause and effect, but given how universally unhelpful all the otherstudy drugs have been for pf,   rec max ppi / diet/ lifestyle modification reviewed

## 2010-08-08 NOTE — Patient Instructions (Addendum)
Symbicort 160 Take 2 puffs first thing in am and then another 2 puffs about 12 hours later.     Work on inhaler technique:  relax and gently blow all the way out then take a nice smooth deep breath back in, triggering the inhaler at same time you start breathing in.  Hold for up to 5 seconds if you can. Blow out through nose  Rinse and gargle with water when done   If your mouth or throat starts to bother you,   I suggest you time the inhaler to your dental care and after using the inhaler(s) brush teeth and tongue with a baking soda containing toothpaste and when you rinse this out, gargle with it first to see if this helps your mouth and throat.     Increase Prilosec  Take 30- 60 min before your first and last meals of the day    Prednisone 10 mg take  4 each am x 2 days,   2 each am x 2 days,  1 each am x2days and stop   Please schedule a follow up office visit in 2 weeks, sooner if needed

## 2010-08-09 ENCOUNTER — Telehealth: Payer: Self-pay | Admitting: Internal Medicine

## 2010-08-09 NOTE — Progress Notes (Signed)
Quick Note:  Spoke with pt's son and notified of results/recs per MW. He verbalized understanding and will relay this to pt. ______

## 2010-08-09 NOTE — Telephone Encounter (Signed)
Spoke with the pharmacist at [facility] and they did not receive any prescriptions on this patient on 08/08/2010. Gave verbal order for the Prednisone, Omeprazole and Symbicort per instructions at OV on 08/08/2010 w/ MW. Pt and son are aware to check with the pharmacy before going to pick medication up.

## 2010-08-26 ENCOUNTER — Encounter: Payer: Self-pay | Admitting: Internal Medicine

## 2010-08-27 ENCOUNTER — Ambulatory Visit (INDEPENDENT_AMBULATORY_CARE_PROVIDER_SITE_OTHER): Payer: Medicare Other | Admitting: Internal Medicine

## 2010-08-27 ENCOUNTER — Encounter: Payer: Self-pay | Admitting: Internal Medicine

## 2010-08-27 DIAGNOSIS — J8410 Pulmonary fibrosis, unspecified: Secondary | ICD-10-CM

## 2010-08-27 DIAGNOSIS — R05 Cough: Secondary | ICD-10-CM

## 2010-08-27 NOTE — Patient Instructions (Addendum)
Work on inhaler technique:  relax and gently blow all the way out then take a nice smooth deep breath back in, triggering the inhaler at same time you start breathing in.  Hold for up to 5 seconds if you can.  Rinse and gargle with water when done   If your mouth or throat starts to bother you,   I suggest you time the inhaler to your dental care and after using the inhaler(s) brush teeth and tongue with a baking soda containing toothpaste and when you rinse this out, gargle with it first to see if this helps your mouth and throat.    Please schedule a follow up office visit in 4 weeks, sooner if needed with PFT's

## 2010-08-27 NOTE — Assessment & Plan Note (Signed)
Use of PPI is associated with improved survival time and with decreased radiologic fibrosis per King's study published in AJRCCM vol 184 p1390.  Dec 2011  This may not be cause and effect, but given how universally unhelpful all the otherstudy drugs have been for pf,   rec  ppi bid and lifestyle and return for pft's w/in one month

## 2010-08-27 NOTE — Progress Notes (Signed)
Subjective:    Patient ID: Donna Watts, female    DOB: 22-Apr-1930, 77 y.o.   MRN: [id number]  HPI   Subjective:    Patient ID: Donna Watts, female    DOB: 03-10-1930, 77 y.o.   MRN: [id number]  HPI  Primary Provider/Referring Provider: Dr Mertha Finders    39 yowf never smoker mother of a [place] PA with h/o cough onset around 2005 with a cxr showing scarring per Dr Inda Merlin office records.   October 12, 2008 ov at request of Kozlow with worse cough winter 2010 and after prednisone seemed better except still cough at hs. mostly dry. 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 rec  Stop fish oil  continue omeprazole but take one 30-60 min before first meal of the day  for cough try delsym 2 tsp every 12   November 09, 2008 cough much better, doe minimal improvement certainly no worse. no sign arthritis or dysphagia.  rec continue omeprazole Take one 30-60 min before first meal of the day  Please schedule a follow-up appointment in 3 months with CXR sooner if breathing or cough worsen  NO MACRODANTIN   September 2010 flew to [place] one month after landed increase cough esp at bedtime better after rx which included European version of symbicort which is dpi. Back to baseline doe, no cough.      October 25, 2009 Followup with PFT's. Pt states that her cough is much better. She still has occ cough at night- prod with clear sputum.  No change in rx  08/08/2010 ov fine until October  2011 while in [place] p sev weeks started coughing and doe and persisted since then varies, some worse when eat. Did see [place] doctor and arrived back in [place] x 48hours, has flonase. Minimal mucoid sputum. Also coughing at hs, some better with symbiocrt ( [place] version which is dpi)  Symbicort 160 Take 2 puffs first thing in am and then another 2 puffs about 12 hours later.    Work on inhaler technique:    Increase Prilosec  Take 30- 60 min before your first and last meals of the day     Prednisone 10 mg take  4 each am x 2 days,   2 each am x 2 days,  1 each am x2days and stop   08/27/2010 ov/Wert cc cough some better ? Due to zpak , no change doe.  No excess mucus,  .Sleeping ok without nocturnal  or early am exac of resp c/o's or need for noct saba.    Pt denies any significant sore throat, dysphagia, itching, sneezing,  nasal congestion or excess/ purulent secretions,  fever, chills, sweats, unintended wt loss, pleuritic or exertional cp, hempoptysis, orthopnea pnd or leg swelling.    Also denies any obvious fluctuation of symptoms with weather or environmental changes or other aggravating or alleviating factors.           Past Medical History:  Pulmonary Fibrosis..................................................Marland KitchenWert  - PFT's November 09, 2008 VC 2.03 ( 92%) DLC0 52%  - PFT's October 25, 2009 VC 2.0 (92) DLC0 58%  -PFT's ordered for June 2012 - Ex desat documented 11/09/08 x 1 lap > 84% p  2 laps October 25, 2009 > 84% p one lap 08/27/2010  Hyperlipidemia  GERD  - ? rx to pepcid > try off Sep 07, 2009 resolved          Review of Systems     Objective:  Physical Exam wt   140 November 09, 2008 > 141 May 24, 2009 >>144 July 26, 2009> 140 08/08/2010  > 136 08/27/2010  HEENT: nl dentition, turbinates, and orophanx. Nl external ear canals without cough reflex  NECK : without JVD/Nodes/TM/ nl carotid upstrokes bilaterally  LUNGS: no acc muscle use, classic dry crackles on insp bases > apices bilaterally, no cough on insp  CV: RRR no s3 or murmur or increase in P2, no edema  ABD: soft and nontender with nl excursion in the supine position. No bruits or organomegaly, bowel sounds nl  MS: warm without deformities, calf tenderness, cyanosis or clubbing  Assessment & Plan:    Review of Systems     Objective:   Physical Exam        Assessment & Plan:

## 2010-08-27 NOTE — Assessment & Plan Note (Signed)
The most common causes of chronic cough in immunocompetent adults include the following: upper airway cough syndrome (UACS), previously referred to as postnasal drip syndrome (PNDS), which is caused by variety of rhinosinus conditions; (2) asthma; (3) GERD; (4) chronic bronchitis from cigarette smoking or other inhaled environmental irritants; (5) nonasthmatic eosinophilic bronchitis; and (6) bronchiectasis.   These conditions, singly or in combination, have accounted for up to 94% of the causes of chronic cough in prospective studies.   Other conditions have constituted no >6% of the causes in prospective studies These have included bronchogenic carcinoma, chronic interstitial pneumonia, sarcoidosis, left ventricular failure, ACEI-induced cough, and aspiration from a condition associated with pharyngeal dysfunction.   .Chronic cough is often simultaneously caused by more than one condition. A single cause has been found from 38 to 82% of the time, multiple causes from 18 to 62%. Multiply caused cough has been the result of three diseases up to 42% of the time.    Believe there are multiple mechanisms at play here including upper airway cough syndrome and maybe cough variant asthma as suggested by response to symbicort, albeit with poor hfa and misunderstanding of dosing  The proper method of use, as well as anticipated side effects, of this metered-dose inhaler are discussed and demonstrated to the patient. This improved to 25% with coaching and if no better need to consider advair or performist/bud neb

## 2010-09-25 ENCOUNTER — Ambulatory Visit (INDEPENDENT_AMBULATORY_CARE_PROVIDER_SITE_OTHER): Payer: Medicare Other | Admitting: Internal Medicine

## 2010-09-25 ENCOUNTER — Encounter: Payer: Self-pay | Admitting: Internal Medicine

## 2010-09-25 DIAGNOSIS — J8410 Pulmonary fibrosis, unspecified: Secondary | ICD-10-CM

## 2010-09-25 DIAGNOSIS — K219 Gastro-esophageal reflux disease without esophagitis: Secondary | ICD-10-CM

## 2010-09-25 DIAGNOSIS — J9610 Chronic respiratory failure, unspecified whether with hypoxia or hypercapnia: Secondary | ICD-10-CM

## 2010-09-25 DIAGNOSIS — R05 Cough: Secondary | ICD-10-CM

## 2010-09-25 LAB — PULMONARY FUNCTION TEST

## 2010-09-25 NOTE — Assessment & Plan Note (Signed)
No definite progression in terms of either pft's or ex sats off prednisone and on max gerd rx   Use of PPI is associated with improved survival time and with decreased radiologic fibrosis per King's study published in AJRCCM vol 184 p1390.  Dec 2011  This may not be cause and effect, but given how universally unhelpful all the otherstudy drugs have been for pf,   rec continue  rx ppi / diet/ lifestyle modification.

## 2010-09-25 NOTE — Progress Notes (Deleted)
Subjective:    Patient ID: Donna Watts, female    DOB: 10-22-1929, 77 y.o.   MRN: [id number]  HPI   Subjective:    Patient ID: Donna Watts, female    DOB: 1929-08-22, 77 y.o.   MRN: [id number]  HPI  Primary Provider/Referring Provider: Dr Mertha Finders    23 yowf never smoker mother of a [place] PA with h/o cough onset around 2005 with a cxr showing scarring per Dr Inda Merlin office records.   October 12, 2008 ov at request of Kozlow with worse cough winter 2010 and after prednisone seemed better except still cough at hs. mostly dry. 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 rec  Stop fish oil  continue omeprazole but take one 30-60 min before first meal of the day  for cough try delsym 2 tsp every 12   November 09, 2008 cough much better, doe minimal improvement certainly no worse. no sign arthritis or dysphagia.  rec continue omeprazole Take one 30-60 min before first meal of the day  Please schedule a follow-up appointment in 3 months with CXR sooner if breathing or cough worsen  NO MACRODANTIN   September 2010 flew to [place] one month after landed increase cough esp at bedtime better after rx which included European version of symbicort which is dpi. Back to baseline doe, no cough.      October 25, 2009 Followup with PFT's. Pt states that her cough is much better. She still has occ cough at night- prod with clear sputum.  No change in rx  08/08/2010 ov fine until October  2011 while in [place] p sev weeks started coughing and doe and persisted since then varies, some worse when eat. Did see [place] doctor and arrived back in [place] x 48hours, has flonase. Minimal mucoid sputum. Also coughing at hs, some better with symbiocrt ( [place] version which is dpi)  Symbicort 160 Take 2 puffs first thing in am and then another 2 puffs about 12 hours later.    Work on inhaler technique:    Increase Prilosec  Take 30- 60 min before your first and last meals of the day     Prednisone 10 mg take  4 each am x 2 days,   2 each am x 2 days,  1 each am x2days and stop   08/27/2010 ov/Wert cc cough some better ? Due to zpak , no change doe.  No excess mucus,  .Sleeping ok without nocturnal  or early am exac of resp c/o's or need for noct saba.  rec work on hfa, no change in rx  09/25/2010 ov/Wert cc cough and breathing better but using symbicort dpi from [place]            Past Medical History:  Pulmonary Fibrosis..................................................Marland KitchenWert  - PFT's November 09, 2008 VC 2.03 ( 92%) DLC0 52%  - PFT's October 25, 2009 VC 2.0 (92) DLC0 58%  -PFT's  09/25/2010  VC 1.88 (86%)  DLC0 54% - Ex desat documented 11/09/08 x 1 lap > 84% p  2 laps October 25, 2009    > 84% p one lap 08/27/2010  >> Hyperlipidemia  GERD  - ? rx to pepcid > try off Sep 07, 2009 resolved          Review of Systems     Objective:   Physical Exam Amb wf nad wt   140 November 09, 2008 > 141 May 24, 2009 >>144 July 26, 2009> 140 08/08/2010  >  136 08/27/2010 > 134 09/25/2010  HEENT: nl dentition, turbinates, and orophanx. Nl external ear canals without cough reflex  NECK : without JVD/Nodes/TM/ nl carotid upstrokes bilaterally  LUNGS: no acc muscle use, classic dry crackles on insp bases > apices bilaterally, no cough on insp  CV: RRR no s3 or murmur or increase in P2, no edema  ABD: soft and nontender with nl excursion in the supine position. No bruits or organomegaly, bowel sounds nl  MS: warm without deformities, calf tenderness, cyanosis or clubbing  Assessment & Plan:    Review of Systems     Objective:   Physical Exam        Assessment & Plan:

## 2010-09-25 NOTE — Progress Notes (Signed)
Subjective:     Patient ID: Donna Watts, female   DOB: June 24, 1929, 77 y.o.   MRN: [id number]  HPI ry Provider/Referring Provider: Dr Mertha Finders    63 yowf never smoker mother of a [place] PA with h/o cough onset around 2005 with a cxr showing scarring per Dr Inda Merlin office records.   October 12, 2008 ov at request of Kozlow with worse cough winter 2010 and after prednisone seemed better except still cough at hs. mostly dry. 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 rec  Stop fish oil  continue omeprazole but take one 30-60 min before first meal of the day  for cough try delsym 2 tsp every 12   November 09, 2008 cough much better, doe minimal improvement certainly no worse. no sign arthritis or dysphagia.  rec continue omeprazole Take one 30-60 min before first meal of the day  Please schedule a follow-up appointment in 3 months with CXR sooner if breathing or cough worsen  NO MACRODANTIN   September 2010 flew to [place] one month after landed increase cough esp at bedtime better after rx which included European version of symbicort which is dpi. Back to baseline doe, no cough.      October 25, 2009 Followup with PFT's. Pt states that her cough is much better. She still has occ cough at night- prod with clear sputum.  No change in rx  08/08/2010 ov fine until October  2011 while in [place] p sev weeks started coughing and doe and persisted since then varies, some worse when eat. Did see [place] doctor and arrived back in [place] x 48hours, has flonase. Minimal mucoid sputum. Also coughing at hs, some better with symbiocrt ( [place] version which is dpi)  Symbicort 160 Take 2 puffs first thing in am and then another 2 puffs about 12 hours later.    Work on inhaler technique:    Increase Prilosec  Take 30- 60 min before your first and last meals of the day    Prednisone 10 mg take  4 each am x 2 days,   2 each am x 2 days,  1 each am x2days and stop   09/25/2010 ov/Peta Peachey cc  cough and breathing better but using symbicort dpi from [place]. Sleeping ok without nocturnal  or early am exac of resp c/o's.  Pt denies any significant sore throat, dysphagia, itching, sneezing,  nasal congestion or excess/ purulent secretions,  fever, chills, sweats, unintended wt loss, pleuritic or exertional cp, hempoptysis, orthopnea pnd or leg swelling.    Also denies any obvious fluctuation of symptoms with weather or environmental changes or other aggravating or alleviating factors.   Past Medical History:  Pulmonary Fibrosis..................................................Marland KitchenWert  - PFT's November 09, 2008 VC 2.03 ( 92%)  DLC0 52%  - PFT's October 25, 2009 VC 2.0 (92%)    DLC0 58%  -PFT's  09/25/10            VC 1.88 (86%)  DLCO 54 - Ex desat documented 11/09/08 x 1 lap > 84% p 2 laps October 25, 2009 > 84% p one lap 08/27/2010 > 87 p 2 laps RA 09/25/2010  Hyperlipidemia  GERD  - ? rx to pepcid > try off Sep 07, 2009 resolved    Review of Systems     Objective:   Physical Exam    wt 140 November 09, 2008 > 141 May 24, 2009 >>144 July 26, 2009> 140 08/08/2010 > 136 08/27/2010 > 134 09/25/2010  HEENT: nl dentition, turbinates, and orophanx. Nl external ear canals without cough reflex  NECK : without JVD/Nodes/TM/ nl carotid upstrokes bilaterally  LUNGS: no acc muscle use, classic dry crackles on insp bases > apices bilaterally, no cough on insp  CV: RRR no s3 or murmur or increase in P2, no edema  ABD: soft and nontender with nl excursion in the supine position. No bruits or organomegaly, bowel sounds nl  MS: warm without deformities, calf tenderness, cyanosis or clubbing  Assessment:         Plan:

## 2010-09-25 NOTE — Assessment & Plan Note (Signed)
Use of PPI is associated with improved survival time and with decreased radiologic fibrosis per King's study published in AJRCCM vol 184 p1390.  Dec 2011  This may not be cause and effect, but given how universally unhelpful all the otherstudy drugs have been for pf,   rec continue max  rx ppi / diet/ lifestyle modification/ reviewed

## 2010-09-25 NOTE — Progress Notes (Signed)
PFT done today.

## 2010-09-25 NOTE — Assessment & Plan Note (Signed)
Better on symbicort dpi  The proper method of use, as well as anticipated side effects, of this metered-dose inhaler are discussed and demonstrated to the patient. Only improved to 50 % with coaching and remains to be see whether will maintain control with hfa symbicort  F/u every 6 weeks planned

## 2010-09-25 NOTE — Patient Instructions (Signed)
Work on inhaler technique:  relax and gently blow all the way out then take a nice smooth deep breath back in, triggering the inhaler at same time you start breathing in.  Hold for up to 5 seconds if you can.  Rinse and gargle with water when done   If your mouth or throat starts to bother you,   I suggest you time the inhaler to your dental care and after using the inhaler(s) brush teeth and tongue with a baking soda containing toothpaste and when you rinse this out, gargle with it first to see if this helps your mouth and throat.    Please schedule a follow up office visit in 6 weeks, call sooner if needed

## 2010-09-27 ENCOUNTER — Encounter: Payer: Self-pay | Admitting: Internal Medicine

## 2010-11-18 ENCOUNTER — Ambulatory Visit: Payer: Medicare Other | Admitting: Internal Medicine

## 2010-11-27 ENCOUNTER — Encounter: Payer: Self-pay | Admitting: Internal Medicine

## 2010-12-02 ENCOUNTER — Ambulatory Visit (INDEPENDENT_AMBULATORY_CARE_PROVIDER_SITE_OTHER): Payer: Medicare Other | Admitting: Internal Medicine

## 2010-12-02 ENCOUNTER — Encounter: Payer: Self-pay | Admitting: Internal Medicine

## 2010-12-02 DIAGNOSIS — J8410 Pulmonary fibrosis, unspecified: Secondary | ICD-10-CM

## 2010-12-02 DIAGNOSIS — R05 Cough: Secondary | ICD-10-CM

## 2010-12-02 MED ORDER — BUDESONIDE-FORMOTEROL FUMARATE 160-4.5 MCG/ACT IN AERO: INHALATION_SPRAY | RESPIRATORY_TRACT | Status: DC

## 2010-12-02 MED ORDER — FLUTICASONE PROPIONATE 50 MCG/ACT NA SUSP: 2.0000 | Freq: Two times a day (BID) | NASAL | Status: DC | PRN

## 2010-12-02 NOTE — Progress Notes (Signed)
Subjective:     Patient ID: Donna Watts, female   DOB: 08-24-1929, 77 y.o.   MRN: [id number]  HPI ry Provider/Referring Provider: Dr Mertha Finders    89 yowf never smoker mother of a [place] PA with h/o cough onset around 2005 with a cxr showing scarring per Dr Inda Merlin office records.   October 12, 2008 ov at request of [place] with worse cough winter 2010 and after prednisone seemed better except still cough at hs. mostly dry. 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 rec  Stop fish oil  continue omeprazole but take one 30-60 min before first meal of the day  for cough try delsym 2 tsp every 12   November 09, 2008 cough much better, doe minimal improvement certainly no worse. no sign arthritis or dysphagia.  rec continue omeprazole Take one 30-60 min before first meal of the day  Please schedule a follow-up appointment in 3 months with CXR sooner if breathing or cough worsen  NO MACRODANTIN   September 2010 flew to [place] one month after landed increase cough esp at bedtime better after rx which included European version of symbicort which is dpi. Back to baseline doe, no cough.      October 25, 2009 Followup with PFT's. Pt states that her cough is much better. She still has occ cough at night- prod with clear sputum.  No change in rx  08/08/2010 ov fine until October  2011 while in [place] p sev weeks started coughing and doe and persisted since then varies, some worse when eat. Did see [place] doctor and arrived back in [place] x 48hours, has flonase. Minimal mucoid sputum. Also coughing at hs, some better with symbiocrt ( [place] version which is dpi)  Symbicort 160 Take 2 puffs first thing in am and then another 2 puffs about 12 hours later.    Work on inhaler technique:    Increase Prilosec  Take 30- 60 min before your first and last meals of the day    Prednisone 10 mg take  4 each am x 2 days,   2 each am x 2 days,  1 each am x2days and stop   09/25/2010 ov/Nazareth Kirk cc  cough and breathing better but using symbicort dpi from [place]. Sleeping ok without nocturnal  or early am exac of resp c/o's.   rec no change in rx.  Work on inhaler technique:   12/02/2010 f/u ov/Aarib Pulido cc doe x 20 min s stopping.  No sign cough.   Pt denies any significant sore throat, dysphagia, itching, sneezing,  nasal congestion or excess/ purulent secretions,  fever, chills, sweats, unintended wt loss, pleuritic or exertional cp, hempoptysis, orthopnea pnd or leg swelling.    Also denies any obvious fluctuation of symptoms with weather or environmental changes or other aggravating or alleviating factors.     Past Medical History:  Pulmonary Fibrosis..................................................Marland KitchenWert  - PFT's November 09, 2008 VC 2.03 ( 92%)  DLC0 52%  - PFT's October 25, 2009 VC 2.0 (92%)    DLC0 58%  -PFT's  09/25/10            VC 1.88 (86%)  DLCO 54 - Ex desat documented 11/09/08 x 1 lap > 84% p 2 laps October 25, 2009 > 84% p one lap 08/27/2010 > 87 p 2 laps RA 09/25/2010  Hyperlipidemia  GERD  - ? rx to pepcid > try off Sep 07, 2009 resolved  Objective:   Physical Exam    wt 140 November 09, 2008 > 141 May 24, 2009 >>144 July 26, 2009> 140 08/08/2010 > 136 08/27/2010 > 134 09/25/2010 > 131 12/02/2010  HEENT: nl dentition, turbinates, and orophanx. Nl external ear canals without cough reflex  NECK : without JVD/Nodes/TM/ nl carotid upstrokes bilaterally  LUNGS: no acc muscle use, classic dry crackles on insp bases > apices bilaterally, no cough on insp  CV: RRR no s3 or murmur or increase in P2, no edema  ABD: soft and nontender with nl excursion in the supine position. No bruits or organomegaly, bowel sounds nl  MS: warm without deformities, calf tenderness, cyanosis or clubbing  Assessment:         Plan:

## 2010-12-02 NOTE — Patient Instructions (Signed)
Work on inhaler technique:  relax and gently blow all the way out then take a nice smooth deep breath back in, triggering the inhaler at same time you start breathing in.  Hold for up to 5 seconds if you can.  Rinse and gargle with water when done   If your mouth or throat starts to bother you,   I suggest you time the inhaler to your dental care and after using the inhaler(s) brush teeth and tongue with a baking soda containing toothpaste and when you rinse this out, gargle with it first to see if this helps your mouth and throat.     Follow up due when you return from [place]

## 2010-12-03 ENCOUNTER — Telehealth: Payer: Self-pay | Admitting: Internal Medicine

## 2010-12-03 NOTE — Telephone Encounter (Signed)
Spoke with pt daughter and she wanted to know if Dr. Melvyn Novas felt like pt would need an aerochamber to help pt with her inhalers. As well as wanted to inform Dr. Melvyn Novas pt had last pneumovax in 2004. Pt's daughter stated her pcp nurse advised them she would need a repeat dose. Pt's daughter wants to clarify this with Dr. Melvyn Novas. Please advise Thanks  Charma Igo, CMA

## 2010-12-04 ENCOUNTER — Encounter: Payer: Self-pay | Admitting: Internal Medicine

## 2010-12-04 NOTE — Telephone Encounter (Signed)
Ok with me but pt declined 02 so we didn't verify what amt she needs to prevent chronic desats - ideally needs ov for this to meet medicare guidelines for payment of amb 02

## 2010-12-04 NOTE — Telephone Encounter (Signed)
Dr Melvyn Novas, before I call her back, can you pls advise whether you will sign order for her to have a pulse ox? Daughter had asked about this as well. Thanks!

## 2010-12-04 NOTE — Telephone Encounter (Signed)
Called, spoke with pt's daughter, Donna Watts.  She is aware MW does not rec pts use spacers with symbicort and per CDC guidelines pt is covered for the pneumovax bc she had it after she was 74.  Donna Watts verbalized understanding of this and is requesting a script for pulse ox to be mailed to pt's home address which I verified.  MW, are you ok with this?

## 2010-12-04 NOTE — Assessment & Plan Note (Signed)
Response to symbicort suggests an airway component.  DDX of  difficult airways managment all start with A and  include Adherence, Ace Inhibitors, Acid Reflux, Active Sinus Disease, Alpha 1 Antitripsin deficiency, Anxiety masquerading as Airways dz,  ABPA,  allergy(esp in young), Aspiration (esp in elderly), Adverse effects of DPI,  Active smokers, plus two Bs  = Bronchiectasis and Beta blocker use..and one C= CHF  Adherence is always the initial "prime suspect" and is a multilayered concern that requires a "trust but verify" approach in every patient - starting with knowing how to use medications, especially inhalers, correctly, keeping up with refills and understanding the fundamental difference between maintenance and prns vs those medications only taken for a very short course and then stopped and not refilled. The proper method of use, as well as anticipated side effects, of this metered-dose inhaler are discussed and demonstrated to the patient. Improved to 50% with coaching.

## 2010-12-04 NOTE — Telephone Encounter (Signed)
We don't use spacers with symbicort  CDC does not rec pneumovax after the age of 45 - since she was over 67 in 2004 she's covered already using the guidelines and no more needed

## 2010-12-04 NOTE — Assessment & Plan Note (Signed)
No definite progression - she's a lot more active than previously and that's why I think she's desatting worse - she certainly doesn't seem worse and should be ok to travel

## 2010-12-05 NOTE — Telephone Encounter (Signed)
Rx placed in MW's to do for signature.

## 2010-12-05 NOTE — Telephone Encounter (Signed)
Called, spoke with pt's daughter, Donna Watts.  She is requesting rx to be mailed to pt's home address which I verified.  Rx placed in mailed [facility] aware and also aware Medicare may not reimburse for pulse and if not, it may help at tax time.  Mary verbalized understanding and will call back if anything further is needed.

## 2010-12-05 NOTE — Telephone Encounter (Signed)
I misunderstood - I thought she wanted to go ahead and schedule amb 02  I don't know that medicare reiumburses for a pulse ox (be sure she knows!) but ok to write her a prescription for it , maybe it will help at tax time

## 2011-06-19 ENCOUNTER — Telehealth: Payer: Self-pay | Admitting: Internal Medicine

## 2011-06-19 DIAGNOSIS — J8410 Pulmonary fibrosis, unspecified: Secondary | ICD-10-CM

## 2011-06-19 NOTE — Telephone Encounter (Signed)
LMOM TCB x1.  No mention in last ov note of pulm rehab.  MW is in office this afternoon to ask.

## 2011-06-19 NOTE — Telephone Encounter (Signed)
Dr. Melvyn Novas, the daughter was also asking about lung transplant, and whether or not you feel that she is a candidate for this. Please advise thanks

## 2011-06-19 NOTE — Telephone Encounter (Signed)
Called and spoke with pt's daughter in law, [name], and informed her ok per MW for pulm rehab but pt is generally not a candidate for lung transplant.  Mary verbalized understanding.  Pt already has pending appt with MW for 07/04/11.  Order sent to [facility] for pulm rehab.

## 2011-06-19 NOTE — Telephone Encounter (Signed)
Donna Watts returned call.  Pt returned from [place], was hospitalized x2 weeks there for PNA and was dc'd with oxygen.  Is requesting for pt to go to pulm rehab. MW not in office until this office, [name] okay to wait until then.  [name] is also requesting to know some requirements on lung transplant.  Dr Melvyn Novas please advise, thanks!

## 2011-06-19 NOTE — Telephone Encounter (Signed)
Needs to return for ov to regroup but generally not a candidate for transplant at age 78

## 2011-06-19 NOTE — Telephone Encounter (Signed)
[place] with me

## 2011-06-23 ENCOUNTER — Ambulatory Visit: Payer: Medicare Other | Admitting: Internal Medicine

## 2011-07-02 ENCOUNTER — Encounter (HOSPITAL_COMMUNITY)
Admission: RE | Admit: 2011-07-02 | Discharge: 2011-07-02 | Disposition: A | Discharge status: Home / Self Care | Payer: Medicare Other | Source: Ambulatory Visit | Attending: Internal Medicine | Admitting: Internal Medicine

## 2011-07-02 DIAGNOSIS — J9610 Chronic respiratory failure, unspecified whether with hypoxia or hypercapnia: Secondary | ICD-10-CM | POA: Insufficient documentation

## 2011-07-02 DIAGNOSIS — Z5189 Encounter for other specified aftercare: Secondary | ICD-10-CM | POA: Insufficient documentation

## 2011-07-02 DIAGNOSIS — K219 Gastro-esophageal reflux disease without esophagitis: Secondary | ICD-10-CM | POA: Insufficient documentation

## 2011-07-02 DIAGNOSIS — J8410 Pulmonary fibrosis, unspecified: Secondary | ICD-10-CM | POA: Insufficient documentation

## 2011-07-02 NOTE — Progress Notes (Signed)
Donna Watts came today for orientation to Pulmonary Rehab.  Demonstration and practice of PLB using pulse oximeter.  Patient able to return demonstration satisfactorily. Safety and hand hygiene in the exercise area reviewed with patient.  Patient voices understanding.  6 Min Walk test done using oxygen @ 2L.  She will begin exercise on 07/03/11.   Delightful Lady!

## 2011-07-03 ENCOUNTER — Encounter (HOSPITAL_COMMUNITY)
Admission: RE | Admit: 2011-07-03 | Discharge: 2011-07-03 | Disposition: A | Discharge status: Home / Self Care | Payer: Medicare Other | Source: Ambulatory Visit | Attending: Internal Medicine | Admitting: Internal Medicine

## 2011-07-03 NOTE — Progress Notes (Signed)
First day of exercise in Pulmonary Rehab.  She was oriented to equipment use and safety, RPE and dyspnea scale, rest breaks.  She tolerated the exercise well, vital signs stable.  We did use oxygen @ 2L for exercise.  She had some soreness in her shoulders after using the arm crank and is advised to ice as soon as she gets home.

## 2011-07-04 ENCOUNTER — Ambulatory Visit (INDEPENDENT_AMBULATORY_CARE_PROVIDER_SITE_OTHER): Payer: Medicare Other | Admitting: Internal Medicine

## 2011-07-04 ENCOUNTER — Encounter: Payer: Self-pay | Admitting: Internal Medicine

## 2011-07-04 VITALS — BP 122/74 | HR 64 | Temp 97.5°F | Ht <= 58 in | Wt 126.2 lb

## 2011-07-04 DIAGNOSIS — J9610 Chronic respiratory failure, unspecified whether with hypoxia or hypercapnia: Secondary | ICD-10-CM

## 2011-07-04 DIAGNOSIS — J8410 Pulmonary fibrosis, unspecified: Secondary | ICD-10-CM

## 2011-07-04 DIAGNOSIS — R05 Cough: Secondary | ICD-10-CM

## 2011-07-04 MED ORDER — TRAMADOL HCL 50 MG PO TABS: ORAL_TABLET | ORAL | Status: AC

## 2011-07-04 NOTE — Assessment & Plan Note (Addendum)
Appears to be partly responsive to saba and symbicort so will treat it like cough variant asthma with emphasis on maint rx with symbicort and prn use of saba.  The proper method of use, as well as anticipated side effects, of this metered-dose inhaler are discussed and demonstrated to the patient. Improved only to 50% with spacer (honks) - she feels she needs the albuterol bid now so should resume the symbicort 160 to 2 q12 to see if symptoms improve and/or perceived need for albuterol drops

## 2011-07-04 NOTE — Patient Instructions (Addendum)
Work on inhaler technique:  relax and gently blow all the way out then take a nice smooth deep breath back in, triggering the inhaler at same time you start breathing in.  Hold for up to 5 seconds if you can.  Rinse and gargle with water when done   If your mouth or throat starts to bother you,   I suggest you time the inhaler to your dental care and after using the inhaler(s) brush teeth and tongue with a baking soda containing toothpaste and when you rinse this out, gargle with it first to see if this helps your mouth and throat.     symbicort 160 Take 2 puffs first thing in am and then another 2 puffs about 12 hours later.    Only use your albuterol (blue inhaler/ ventolin)  as a rescue medication to be used if you can't catch your breath by resting or doing a relaxed purse lip breathing pattern. The less you use it, the better it will work when you need it.    For cough use mucinex dm and if still coughing then add the tramadol 50 mg every 4 hours as needed.  Please schedule a follow up office visit in 6 weeks, call sooner if needed     Late add:   Should continue ppi and h2 hs until next ov

## 2011-07-04 NOTE — Progress Notes (Signed)
Subjective:     Patient ID: Donna Watts, female   DOB: 1930/04/08 .   MRN: [id number]  Brief patient profile:  Primary Provider/Referring Provider: Dr Mertha Finders   HPI 67 yowf never smoker mother of a [place] PA with h/o cough onset around 2005 with a cxr showing scarring per Dr Inda Merlin office records.   October 12, 2008 ov at request of [place] with worse cough winter 2010 and after prednisone seemed better except still cough at hs. mostly dry. 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  rec  Stop fish oil  continue omeprazole but take one 30-60 min before first meal of the day  for cough try delsym 2 tsp every 12   November 09, 2008 cough much better, doe minimal improvement certainly no worse. no sign arthritis or dysphagia.  rec continue omeprazole Take one 30-60 min before first meal of the day  Please schedule a follow-up appointment in 3 months with CXR sooner if breathing or cough worsen  NO MACRODANTIN   September 2010 flew to [place] one month after landed increase cough esp at bedtime better after rx which included European version of symbicort which is dpi. Back to baseline doe, no cough.    09/25/2010 ov/Cristella Stiver cc cough and breathing better but using symbicort dpi from [place]. Sleeping ok without nocturnal  or early am exac of resp c/o's.   rec no change in rx.  Work on inhaler technique but use symbicort hfa because we don't have the dpi version here.    12/02/2010 f/u ov/Quintarius Ferns cc doe x 20 min s stopping.  No sign cough.  rec Work on inhaler technique:  Follow up due when you return from [place] to [place] in [place] 2 days arrived from [place] where did not feel she had same stamina due to sob with dx flu vs pna and was discharged on 2lpm with exertion outside the house but rarely inside house off 02.  07/04/2011 f/u ov/Jenness Stemler cc doe better on 02.  Still struggling with inhalers despite spacer and thoroughly confused with the instructions she's received  from multiple doctors and fm members "everyone has an opinion on the right way to do things- says she was taught if the spacer didn't honk she was doing it wrong."  Cough ? Better p albuterol mostly dry. No active sinus or reflux symptoms  Sleeping ok without nocturnal  or early am exacerbation  of respiratory  c/o's or need for noct saba. Also denies any obvious fluctuation of symptoms with weather or environmental changes or other aggravating or alleviating factors except as outlined above   ROS  At present neg for  any significant sore throat, dysphagia, itching, sneezing,  nasal congestion or excess/ purulent secretions,  fever, chills, sweats, unintended wt loss, pleuritic or exertional cp, hempoptysis, orthopnea pnd or leg swelling.  Also denies presyncope, palpitations, heartburn, abdominal pain, nausea, vomiting, diarrhea  or change in bowel or urinary habits, dysuria,hematuria,  rash, arthralgias, visual complaints, headache, numbness weakness or ataxia.     Past Medical History:  Pulmonary Fibrosis..................................................Marland KitchenWert  - PFT's November 09, 2008 VC 2.03 ( 92%)  DLC0 52%  - PFT's October 25, 2009 VC 2.0 (92%)    DLC0 58%  -PFT's  09/25/10            VC 1.88 (86%)  DLCO 54 - Ex desat documented 11/09/08 x 1 lap > 84% p 2 laps October 25, 2009 > 84% p  one lap 08/27/2010 > 87 p 2 laps RA 09/25/2010  Hyperlipidemia  GERD  - ? rx to pepcid > try off Sep 07, 2009 resolved Pneumovax Jan 2013           Objective:   Physical Exam    wt 140 November 09, 2008 > 141 May 24, 2009 >>144 July 26, 2009> 140 08/08/2010 > 136 08/27/2010 > 134 09/25/2010 > 131 12/02/2010  > 07/04/2011 126  HEENT: nl dentition, turbinates, and orophanx. Nl external ear canals without cough reflex  NECK : without JVD/Nodes/TM/ nl carotid upstrokes bilaterally  LUNGS: no acc muscle use, classic dry crackles on insp bases > apices bilaterally, no cough on insp  CV: RRR no s3 or murmur or increase in P2,  no edema  ABD: soft and nontender with nl excursion in the supine position. No bruits or organomegaly, bowel sounds nl  MS: warm without deformities, calf tenderness, cyanosis or clubbing  Assessment:         Plan:

## 2011-07-05 NOTE — Assessment & Plan Note (Signed)
-  PFT's November 09, 2008 VC 2.03 ( 92%) DLC0 52%  - PFT's October 25, 2009 VC 2.0 (92%)   DLC0 58%  -PFT's  09/25/10            VC1.88 (86%)  DLCO 54 - Ex desat documented 11/09/08 x 1 lap > 84% p 2 laps October 25, 2009 > 84% p one lap 08/27/2010     > 87 p 2 laps RA 09/25/2010 > 83% p 2 laps at a very rapid pace 12/02/10 rec pace or wear 02, wants to try pacing - 07/04/2011   Walked RA x one lap @ 185 stopped due to  sats 87% at nl pace > 2lpm corrected the desat with improved tol  DDx for pulmonary fibrosis  includes idiopathic pulmonary fibrosis, pulmonary fibrosis associated with rheumatologic diseases (which have a relatively benign course in most cases) , adverse effect from  drugs such as chemotherapy or amiodarone exposure, nonspecific interstitial pneumonia which is typically steroid responsive, and chronic hypersensitivity pneumonitis.   In active  smokers Langerhan's Cell  Histiocyctosis (eosinophilic granuomatosis),  DIP,  and Respiratory Bronchiolitis ILD also need to be considered,    Whatever she has is not causing any progressive decline in dlco and appears partly steroid responsive.  Use of PPI is associated with improved survival time and with decreased radiologic fibrosis per King's study published in AJRCCM vol 184 p1390.  Dec 2011  This may not be cause and effect, but given how universally unhelpful all the otherstudy drugs have been for pf,   rec continue  rx ppi / diet/ lifestyle modification.

## 2011-07-07 ENCOUNTER — Telehealth: Payer: Self-pay | Admitting: Internal Medicine

## 2011-07-07 DIAGNOSIS — J9621 Acute and chronic respiratory failure with hypoxia: Secondary | ICD-10-CM | POA: Insufficient documentation

## 2011-07-07 NOTE — Telephone Encounter (Signed)
LMTCB

## 2011-07-08 ENCOUNTER — Encounter (HOSPITAL_COMMUNITY)
Admission: RE | Admit: 2011-07-08 | Discharge: 2011-07-08 | Disposition: A | Discharge status: Home / Self Care | Payer: Medicare Other | Source: Ambulatory Visit | Attending: Internal Medicine | Admitting: Internal Medicine

## 2011-07-08 MED ORDER — OMEPRAZOLE 20 MG PO CPDR: DELAYED_RELEASE_CAPSULE | ORAL | Status: DC

## 2011-07-08 NOTE — Telephone Encounter (Signed)
LMOM for pt TCBx2

## 2011-07-08 NOTE — Telephone Encounter (Signed)
Pt's son returned triage's call & can be reached at [phone].  Donna Watts

## 2011-07-08 NOTE — Telephone Encounter (Signed)
Ok to rx with omeprazole if wants prescription or can get prilosec otc - either way it's 20 mg ac

## 2011-07-08 NOTE — Telephone Encounter (Signed)
Called, spoke with pt's son.  Reports pt took 1 tramadol and didn't feel well - was having head pressure and confusion.  They checked her BP which was 175-200/90's.  Pt stopped the tramadol d/t this.  She is using the mucinex dm now.    Also, requesting rxs for omeprazole 20 mg Take 30- 60 min before your first and last meals of the day     - [facility] on [address].  Per last OV note from 07/04/11 with MW, he recs: Late add: Should continue ppi and h2 hs until next ov.  Advised rx will be sent to pharm.  Nothing further needed at this time.    Will forward message to MW as Juluis Rainier on tramadol.

## 2011-07-10 ENCOUNTER — Encounter (HOSPITAL_COMMUNITY)
Admission: RE | Admit: 2011-07-10 | Discharge: 2011-07-10 | Disposition: A | Discharge status: Home / Self Care | Payer: Medicare Other | Source: Ambulatory Visit | Attending: Internal Medicine | Admitting: Internal Medicine

## 2011-07-15 ENCOUNTER — Encounter (HOSPITAL_COMMUNITY)
Admission: RE | Admit: 2011-07-15 | Discharge: 2011-07-15 | Disposition: A | Discharge status: Home / Self Care | Payer: Medicare Other | Source: Ambulatory Visit | Attending: Internal Medicine | Admitting: Internal Medicine

## 2011-07-15 ENCOUNTER — Telehealth: Payer: Self-pay | Admitting: Internal Medicine

## 2011-07-15 MED ORDER — ALBUTEROL SULFATE HFA 108 (90 BASE) MCG/ACT IN AERS: 2.0000 | INHALATION_SPRAY | Freq: Four times a day (QID) | RESPIRATORY_TRACT | Status: DC | PRN

## 2011-07-15 NOTE — Telephone Encounter (Signed)
I spoke with pt and she stated she needed her albuterol inhaler sent to the pharmacy. I advised will send rx and nothing further was needed

## 2011-07-17 ENCOUNTER — Encounter (HOSPITAL_COMMUNITY)
Admission: RE | Admit: 2011-07-17 | Discharge: 2011-07-17 | Disposition: A | Discharge status: Home / Self Care | Payer: Medicare Other | Source: Ambulatory Visit | Attending: Internal Medicine | Admitting: Internal Medicine

## 2011-07-17 NOTE — Progress Notes (Signed)
Pulmonary Rehab Nutrition Screen  Donna Watts 78 y.o. female             Ht: 59" Ht Readings from Last 1 Encounters:  07/04/11 _0  (1.448 m)    Wt:   124.9 lb (56.8 kg) Wt Readings from Last 3 Encounters:  07/04/11 126 lb 3.2 oz (57.244 kg)  12/02/10 131 lb 12.8 oz (59.784 kg)  09/25/10 134 lb (60.782 kg)    BMI: 25.3  36.4%body fat                       Rate Your Plate Score: 63  Please answer the following questions:             YES  NO    Do you live in a nursing home?  X   Do you eat out more than 3 times per week?    X If yes, how many times per week do you eat out?   Do you have food allergies?   X If yes, what are you allergic to?  Have you gained or lost more than 10 lbs without trying?               X If yes, how much weight have you  lost? 7 lbs over 2 weeks due to the flu. Pt states UBW 120 lb.  Do you want to lose weight?     X If yes, what is a goal weight or amount of weight you would like to lose? lbs  Do you eat alone most of the time?   X   Do you eat less than 2 meals/day?  X If yes, how many meals do you eat? 3  Do you use canned and convenience food?  X   Do you use a salt shaker?  X   Do you drink more than 3 alcoholic drinks/day?  X If yes, how many drinks per day?  Are you having trouble with constipation? *  X If yes, what are you doing to help relieve constipation?  Do you have financial difficulties with buying food? *  X   Do you usually need help with grocery shopping or with cooking? *  X   Do you have a poor appetite? *                                       X   Do you have trouble chewing/ swallowing? *   X   Do you take vitamin and mineral or herbal supplements? * X  If yes, what kind of supplements do you currently take? Calcium Citrate with vitamin D, Hydrochlorothiazide, MVI    Past Medical History  Diagnosis Date  . Other and unspecified hyperlipidemia   . Esophageal reflux   . Pulmonary fibrosis    Labs Lipid Panel  No results found  for this basename: chol, trig, hdl, cholhdl, vldl, ldlcalc   No results found for this basename: HGBA1C  Nutrition Risk Level:  Low  Nutrition Note Spoke with pt. Pt is overweight. Pt reports her UBW is "around 120 lbs." Yet, Pt wt is down approximately 10 lbs over the past 10 months. Per pt wt in EPIC, pt wt appears to be trending downward. Pt eats 3 meals a day; most prepared at home.  Making healthy food choices the majority of the time.  Pt's Rate  Your Plate results reviewed with pt.  Pt expressed understanding.  Pt avoids most salty food; does not use canned/ convenience food. Pt states, "I only use fresh vegetables and fruit."  Pt does not add salt to food. Pt enjoys cooking at home for herself and her son, whom she lives with, and rarely eats out.  Nutrition Diagnosis   Food-and nutrition-related knowledge deficit related to lack of exposure to information as related to diagnosis of pulmonary disease Nutrition Rx/Est. Daily Nutrition Needs for: ? wt maintenance 1450-1700 Kcal  70-85 gm protein   1500-2000 mg or less sodium    Nutrition Intervention   Pt's individual nutrition plan and goals reviewed with pt.   Benefits of adopting healthy eating habits discussed when pt's Rate Your Plate reviewed.   Pt to attend the Nutrition and Lung Disease class   Continual client-centered nutrition education by RD, as part of interdisciplinary care. Goal(s) 1. Describe the benefit of including fruits, vegetables, whole grains, and low-fat dairy products in a healthy meal plan. Monitor and Evaluate progress toward nutrition goal with team.

## 2011-07-22 ENCOUNTER — Encounter (HOSPITAL_COMMUNITY)
Admission: RE | Admit: 2011-07-22 | Discharge: 2011-07-22 | Disposition: A | Discharge status: Home / Self Care | Payer: Medicare Other | Source: Ambulatory Visit | Attending: Internal Medicine | Admitting: Internal Medicine

## 2011-07-24 ENCOUNTER — Encounter (HOSPITAL_COMMUNITY)
Admission: RE | Admit: 2011-07-24 | Discharge: 2011-07-24 | Disposition: A | Discharge status: Home / Self Care | Payer: Medicare Other | Source: Ambulatory Visit | Attending: Internal Medicine | Admitting: Internal Medicine

## 2011-07-24 NOTE — Progress Notes (Signed)
Mrs. Copus reported today when she arrived for Pulmonary Rehab that she was experiencing some soreness in her left shoulder.  We advised her to not do any shoulder exercise or stretches today, ice when she gets home and make MD appointment to follow up.  Tolerated exercise well.

## 2011-07-29 ENCOUNTER — Encounter (HOSPITAL_COMMUNITY)
Admission: RE | Admit: 2011-07-29 | Discharge: 2011-07-29 | Disposition: A | Discharge status: Home / Self Care | Payer: Medicare Other | Source: Ambulatory Visit | Attending: Internal Medicine | Admitting: Internal Medicine

## 2011-07-29 DIAGNOSIS — J8410 Pulmonary fibrosis, unspecified: Secondary | ICD-10-CM | POA: Insufficient documentation

## 2011-07-29 DIAGNOSIS — Z5189 Encounter for other specified aftercare: Secondary | ICD-10-CM | POA: Insufficient documentation

## 2011-07-29 DIAGNOSIS — K219 Gastro-esophageal reflux disease without esophagitis: Secondary | ICD-10-CM | POA: Insufficient documentation

## 2011-07-29 DIAGNOSIS — J9610 Chronic respiratory failure, unspecified whether with hypoxia or hypercapnia: Secondary | ICD-10-CM | POA: Insufficient documentation

## 2011-07-29 NOTE — Progress Notes (Signed)
Completed home exercise with patient. Reviewed exercise progression, routine, exercising at a comfortable pace, RPE/Dyspnea scales, how important it is to own a pulse oximeter and how to use one, weather conditions, warning signs and symptoms with exercise, and CP/NTG. We discussed when to call MD. Patient voices understanding. Patient has a goal to improve endurance so she can walk a further distance in her normal daily walks. Will continue to encourage and support.

## 2011-07-31 ENCOUNTER — Encounter (HOSPITAL_COMMUNITY)
Admission: RE | Admit: 2011-07-31 | Discharge: 2011-07-31 | Disposition: A | Discharge status: Home / Self Care | Payer: Medicare Other | Source: Ambulatory Visit | Attending: Internal Medicine | Admitting: Internal Medicine

## 2011-08-05 ENCOUNTER — Encounter (HOSPITAL_COMMUNITY)
Admission: RE | Admit: 2011-08-05 | Discharge: 2011-08-05 | Disposition: A | Discharge status: Home / Self Care | Payer: Medicare Other | Source: Ambulatory Visit | Attending: Internal Medicine | Admitting: Internal Medicine

## 2011-08-07 ENCOUNTER — Encounter (HOSPITAL_COMMUNITY)
Admission: RE | Admit: 2011-08-07 | Discharge: 2011-08-07 | Disposition: A | Discharge status: Home / Self Care | Payer: Medicare Other | Source: Ambulatory Visit | Attending: Internal Medicine | Admitting: Internal Medicine

## 2011-08-12 ENCOUNTER — Encounter (HOSPITAL_COMMUNITY): Payer: Medicare Other

## 2011-08-14 ENCOUNTER — Encounter (HOSPITAL_COMMUNITY)
Admission: RE | Admit: 2011-08-14 | Discharge: 2011-08-14 | Disposition: A | Discharge status: Home / Self Care | Payer: Medicare Other | Source: Ambulatory Visit | Attending: Internal Medicine | Admitting: Internal Medicine

## 2011-08-15 ENCOUNTER — Encounter: Payer: Self-pay | Admitting: Internal Medicine

## 2011-08-15 ENCOUNTER — Ambulatory Visit (INDEPENDENT_AMBULATORY_CARE_PROVIDER_SITE_OTHER): Payer: Medicare Other | Admitting: Internal Medicine

## 2011-08-15 VITALS — BP 122/60 | HR 78 | Temp 97.8°F | Ht <= 58 in | Wt 124.4 lb

## 2011-08-15 DIAGNOSIS — J8410 Pulmonary fibrosis, unspecified: Secondary | ICD-10-CM

## 2011-08-15 DIAGNOSIS — K219 Gastro-esophageal reflux disease without esophagitis: Secondary | ICD-10-CM

## 2011-08-15 DIAGNOSIS — R05 Cough: Secondary | ICD-10-CM

## 2011-08-15 NOTE — Progress Notes (Signed)
Subjective:     Patient ID: Donna Watts, female   DOB: 11/21/1929 .   MRN: [id number]  Brief patient profile:  Primary Provider/Referring Provider: Dr Mertha Finders   HPI 19 yowf never smoker mother of a [place] PA with h/o cough onset around 2005 with a cxr showing scarring per Dr Inda Merlin office records c/w PF  October 12, 2008 ov at request of [place] with worse cough winter 2010 and after prednisone seemed better except still cough at hs. mostly dry. 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  rec  Stop fish oil  continue omeprazole but take one 30-60 min before first meal of the day  for cough try delsym 2 tsp every 12   November 09, 2008 cough much better, doe minimal improvement certainly no worse. no sign arthritis or dysphagia.  rec continue omeprazole Take one 30-60 min before first meal of the day  Please schedule a follow-up appointment in 3 months with CXR sooner if breathing or cough worsen  NO MACRODANTIN   September 2010 flew to [place] one month after landed increase cough esp at bedtime better after rx which included European version of symbicort which is dpi. Back to baseline doe, no cough.    09/25/2010 ov/Korrey Schleicher cc cough and breathing better but using symbicort dpi from [place]. Sleeping ok without nocturnal  or early am exac of resp c/o's.   rec no change in rx.  Work on inhaler technique but use symbicort hfa because we don't have the dpi version here.    12/02/2010 f/u ov/Librado Guandique cc doe x 20 min s stopping.  No sign cough.  rec Work on inhaler technique:  Follow up due when you return from [place] to [place] in [place] 2 days arrived from [place] where did not feel she had same stamina due to sob with dx flu vs pna and was discharged on 2lpm with exertion outside the house but rarely inside house off 02.  07/04/2011 f/u ov/Danette Weinfeld cc doe better on 02.  Still struggling with inhalers despite spacer and thoroughly confused with the instructions she's  received from multiple doctors and fm members "everyone has an opinion on the right way to do things- says she was taught if the spacer didn't honk she was doing it wrong."  Cough ? Better p albuterol mostly dry. No active sinus or reflux symptoms rec Work on inhaler technique:    Symbicort 160 Take 2 puffs first thing in am and then another 2 puffs about 12 hours later.  Only use your albuterol (blue inhaler/ ventolin)  as a rescue medication  For cough use mucinex dm and if still coughing then add the tramadol 50 mg every 4 hours as needed. Late add:   Should continue ppi and h2 hs until next ov   08/15/2011 f/u ov/Marco Adelson cc breathing some better, less cough. No overt hb or sinus complaints  Sleeping ok without nocturnal  or early am exacerbation  of respiratory  c/o's or need for noct saba. Also denies any obvious fluctuation of symptoms with weather or environmental changes or other aggravating or alleviating factors except as outlined above   ROS  At present neg for  any significant sore throat, dysphagia, itching, sneezing,  nasal congestion or excess/ purulent secretions,  fever, chills, sweats, unintended wt loss, pleuritic or exertional cp, hempoptysis, orthopnea pnd or leg swelling.  Also denies presyncope, palpitations, heartburn, abdominal pain, nausea, vomiting, diarrhea  or change in bowel  or urinary habits, dysuria,hematuria,  rash, arthralgias, visual complaints, headache, numbness weakness or ataxia.     Past Medical History:  Pulmonary Fibrosis..................................................Marland KitchenWert  - PFT's November 09, 2008 VC 2.03 ( 92%)  DLC0 52%  - PFT's October 25, 2009 VC 2.0 (92%)    DLC0 58%  -PFT's  09/25/10            VC 1.88 (86%)  DLCO 54 - Ex desat documented 11/09/08 x 1 lap > 84% p 2 laps October 25, 2009 > 84% p one lap 08/27/2010 > 87 p 2 laps RA 09/25/2010  Hyperlipidemia  GERD  - ? rx to pepcid > try off Sep 07, 2009 resolved Pneumovax Jan 2013           Objective:     Physical Exam    wt 140 November 09, 2008 > 141 May 24, 2009 >>144 July 26, 2009> 140 08/08/2010 > 136 08/27/2010 > 134 09/25/2010 > 131 12/02/2010  > 07/04/2011 126 > 08/15/2011  124  HEENT: nl dentition, turbinates, and orophanx. Nl external ear canals without cough reflex  NECK : without JVD/Nodes/TM/ nl carotid upstrokes bilaterally  LUNGS: no acc muscle use, classic dry crackles on insp bases > apices bilaterally, no cough on insp  CV: RRR no s3 or murmur or increase in P2, no edema  ABD: soft and nontender with nl excursion in the supine position. No bruits or organomegaly, bowel sounds nl  MS: warm without deformities, calf tenderness, cyanosis or clubbing  Assessment:         Plan:

## 2011-08-15 NOTE — Patient Instructions (Addendum)
No change in medications or oxygen recommendations ( ok to use 3lpm with activity if needed to keep sat above 90)  Please schedule a follow up visit in 3 months but call sooner if needed - if being followed at [facility] by then ok to cancel until they release you

## 2011-08-16 ENCOUNTER — Other Ambulatory Visit: Payer: Self-pay | Admitting: Internal Medicine

## 2011-08-16 NOTE — Assessment & Plan Note (Signed)
Seems better with symbicort c/w a component of asthma, no changes needed

## 2011-08-16 NOTE — Assessment & Plan Note (Signed)
-  PFT's November 09, 2008 VC 2.03 ( 92%) DLC0 52%  - PFT's October 25, 2009 VC 2.0 (92%)   DLC0 58%  -PFT's  09/25/10            VC1.88 (86%)  DLCO 54 - Ex desat documented 11/09/08 x 1 lap > 84% p 2 laps October 25, 2009 > 84% p one lap 08/27/2010     > 87 p 2 laps RA 09/25/2010 > 83% p 2 laps at a very rapid pace 12/02/10 rec pace or wear 02, wants to try pacing - 07/04/2011   Walked RA x one lap @ 185 stopped due to  sats 87% at nl pace > 2lpm - 08/15/11 Walked 1 lap x 185 ft with sat 86% at nl pace  It does appear she's slowly loosing ground with episodes of acute flare "pneumonia like" and is intertested in pursuing study drugs she's heard about at [facility] through Dr Randol Kern and is actively pursuing this, in meantime no changes needed

## 2011-08-16 NOTE — Assessment & Plan Note (Signed)
Use of PPI is associated with improved survival time and with decreased radiologic fibrosis per King's study published in AJRCCM vol 184 p1390.  Dec 2011  This may not be cause and effect, but given how universally unhelpful all the otherstudy drugs have been for pf,   rec start  rx ppi / diet/ lifestyle modification.

## 2011-08-19 ENCOUNTER — Encounter (HOSPITAL_COMMUNITY)
Admission: RE | Admit: 2011-08-19 | Discharge: 2011-08-19 | Disposition: A | Discharge status: Home / Self Care | Payer: Medicare Other | Source: Ambulatory Visit | Attending: Internal Medicine | Admitting: Internal Medicine

## 2011-08-21 ENCOUNTER — Encounter (HOSPITAL_COMMUNITY)
Admission: RE | Admit: 2011-08-21 | Discharge: 2011-08-21 | Disposition: A | Discharge status: Home / Self Care | Payer: Medicare Other | Source: Ambulatory Visit | Attending: Internal Medicine | Admitting: Internal Medicine

## 2011-08-26 ENCOUNTER — Encounter (HOSPITAL_COMMUNITY)
Admission: RE | Admit: 2011-08-26 | Discharge: 2011-08-26 | Disposition: A | Discharge status: Home / Self Care | Payer: Medicare Other | Source: Ambulatory Visit | Attending: Internal Medicine | Admitting: Internal Medicine

## 2011-08-28 ENCOUNTER — Encounter (HOSPITAL_COMMUNITY)
Admission: RE | Admit: 2011-08-28 | Discharge: 2011-08-28 | Disposition: A | Discharge status: Home / Self Care | Payer: Medicare Other | Source: Ambulatory Visit | Attending: Internal Medicine | Admitting: Internal Medicine

## 2011-08-28 DIAGNOSIS — J9610 Chronic respiratory failure, unspecified whether with hypoxia or hypercapnia: Secondary | ICD-10-CM | POA: Insufficient documentation

## 2011-08-28 DIAGNOSIS — K219 Gastro-esophageal reflux disease without esophagitis: Secondary | ICD-10-CM | POA: Insufficient documentation

## 2011-08-28 DIAGNOSIS — Z5189 Encounter for other specified aftercare: Secondary | ICD-10-CM | POA: Insufficient documentation

## 2011-08-28 DIAGNOSIS — J8410 Pulmonary fibrosis, unspecified: Secondary | ICD-10-CM | POA: Insufficient documentation

## 2011-08-29 ENCOUNTER — Ambulatory Visit (INDEPENDENT_AMBULATORY_CARE_PROVIDER_SITE_OTHER): Payer: Medicare Other | Admitting: Internal Medicine

## 2011-08-29 ENCOUNTER — Encounter: Payer: Self-pay | Admitting: Internal Medicine

## 2011-08-29 VITALS — BP 118/70 | HR 86 | Temp 97.6°F | Ht <= 58 in | Wt 124.4 lb

## 2011-08-29 DIAGNOSIS — J8410 Pulmonary fibrosis, unspecified: Secondary | ICD-10-CM

## 2011-08-29 DIAGNOSIS — J9610 Chronic respiratory failure, unspecified whether with hypoxia or hypercapnia: Secondary | ICD-10-CM

## 2011-08-29 NOTE — Progress Notes (Signed)
Subjective:     Patient ID: Donna Watts, female   DOB: 11/22/1929 .   MRN: [id number]  Brief patient profile:  Primary Provider/Referring Provider: Dr Mertha Finders   HPI 30 yowf never smoker mother of a [place] Donna Watts with h/o cough onset around 2005 with a cxr showing scarring per Dr Inda Merlin office records c/w PF  October 12, 2008 ov at request of [place] with worse cough winter 2010 and after prednisone seemed better except still cough at hs. mostly dry. 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  rec  Stop fish oil  continue omeprazole but take one 30-60 min before first meal of the day  for cough try delsym 2 tsp every 12   November 09, 2008 cough much better, doe minimal improvement certainly no worse. no sign arthritis or dysphagia.  rec continue omeprazole Take one 30-60 min before first meal of the day  Please schedule a follow-up appointment in 3 months with CXR sooner if breathing or cough worsen  NO MACRODANTIN   September 2010 flew to [place] one month after landed increase cough esp at bedtime better after rx which included European version of symbicort which is dpi. Back to baseline doe, no cough.    09/25/2010 ov/Dalonda Simoni cc cough and breathing better but using symbicort dpi from [place]. Sleeping ok without nocturnal  or early am exac of resp c/o's.   rec no change in rx.  Work on inhaler technique but use symbicort hfa because we don't have the dpi version here.    12/02/2010 f/u ov/Eisha Chatterjee cc doe x 20 min s stopping.  No sign cough.  rec Work on inhaler technique:  Follow up due when you return from [place] to [place] in [place] 2 days arrived from [place] where did not feel she had same stamina due to sob with dx flu vs pna and was discharged on 2lpm with exertion outside the house but rarely inside house off 02.  07/04/2011 f/u ov/Kaelei Wheeler cc doe better on 02.  Still struggling with inhalers despite spacer and thoroughly confused with the instructions she's  received from multiple doctors and fm members "everyone has an opinion on the right way to do things- says she was taught if the spacer didn't honk she was doing it wrong."  Cough ? Better p albuterol mostly dry. No active sinus or reflux symptoms rec Work on inhaler technique:    Symbicort 160 Take 2 puffs first thing in am and then another 2 puffs about 12 hours later.  Only use your albuterol (blue inhaler/ ventolin)  as a rescue medication  For cough use mucinex dm and if still coughing then add the tramadol 50 mg every 4 hours as needed. Late add:   Should continue ppi and h2 hs until next ov   08/15/2011 f/u ov/Laurina Fischl cc breathing some better . No overt hb or sinus complaints rec Ok with me to go to [facility] to see if qualifies for any studies but no change in recs  08/29/2011 f/u ov/Sherline Eberwein cc much better p rx for uri with abx and prednisone( completed 4/27) with no cough and baseline doe x outdoors walking better on 3lpm np. No significant variability to doe nor any assoc cough or over hb or sinus complaints or daytime need for saba or cough suppression.   Sleeping ok without nocturnal  or early am exacerbation  of respiratory  c/o's or need for noct saba. Also denies any obvious fluctuation  of symptoms with weather or environmental changes or other aggravating or alleviating factors except as outlined above   ROS  At present neg for  any significant sore throat, dysphagia, itching, sneezing,  nasal congestion or excess/ purulent secretions,  fever, chills, sweats, unintended wt loss, pleuritic or exertional cp, hempoptysis, orthopnea pnd or leg swelling.  Also denies presyncope, palpitations, heartburn, abdominal pain, nausea, vomiting, diarrhea  or change in bowel or urinary habits, dysuria,hematuria,  rash, arthralgias, visual complaints, headache, numbness weakness or ataxia.     Past Medical History:  Pulmonary Fibrosis..................................................Marland KitchenWert  - PFT's November 09, 2008 VC 2.03 ( 92%)  DLC0 52%  - PFT's October 25, 2009 VC 2.0 (92%)    DLC0 58%  -PFT's  09/25/10            VC 1.88 (86%)  DLCO 54 Hyperlipidemia  GERD  - ? rx to pepcid > try off Sep 07, 2009 resolved Pneumovax Jan 2013           Objective:   Physical Exam    wt 140 November 09, 2008 > 141 May 24, 2009 >>144 July 26, 2009> 140 08/08/2010 > 136 08/27/2010 > 134 09/25/2010 > 131 12/02/2010  > 07/04/2011 126 > 08/15/2011  124 > 124 08/29/2011  HEENT: nl dentition, turbinates, and orophanx. Nl external ear canals without cough reflex  NECK : without JVD/Nodes/TM/ nl carotid upstrokes bilaterally  LUNGS: no acc muscle use, classic dry crackles on insp mostly bases bilaterally with  no cough on insp  CV: RRR no s3 or murmur or increase in P2, no edema  ABD: soft and nontender with nl excursion in the supine position. No bruits or organomegaly, bowel sounds nl  MS: warm without deformities, calf tenderness, cyanosis or clubbing  Assessment:         Plan:

## 2011-08-29 NOTE — Assessment & Plan Note (Addendum)
-  PFT's November 09, 2008 VC 2.03 ( 92%) DLC0 52%  - PFT's October 25, 2009 VC 2.0 (92%)   DLC0 58%  -PFT's  09/25/10            VC1.88 (86%)  DLCO 54 - Ex desat documented 11/09/08 x 1 lap > 84% p 2 laps October 25, 2009 > 84% p one lap 08/27/2010     > 87 p 2 laps RA 09/25/2010 > 83% p 2 laps at a very rapid pace 12/02/10 rec pace or wear 02, wants to try pacing - 07/04/2011   Walked RA x one lap @ 185 stopped due to  sats 87% at nl pace > 2lpm - 08/29/2011   Walked RA x one lap @ 185 stopped due to  desat to 88, improved but not eliminated on 2lpm so rec use 3lpm with ex  Unusual form of PF ? BOOP-like and potentially steroid responsive, if only sltly > f/u planned at [facility]

## 2011-08-29 NOTE — Patient Instructions (Signed)
Wear 02 2lpm with walking outside the house and 3lpm when really exerting

## 2011-08-29 NOTE — Assessment & Plan Note (Signed)
-  07/04/2011   Walked RA x one lap @ 185 stopped due to  sats 87% at nl pace > 2lpm - 08/29/2011  sats ok at rest >  Walked RA x one lap @ 185 stopped due to  desat to 88% better on 2lpm, eliminated on 3lpm  Certified for 02 up to 3lpm with ex, but ok at rest

## 2011-09-02 ENCOUNTER — Encounter (HOSPITAL_COMMUNITY)
Admission: RE | Admit: 2011-09-02 | Discharge: 2011-09-02 | Disposition: A | Discharge status: Home / Self Care | Payer: Medicare Other | Source: Ambulatory Visit | Attending: Internal Medicine | Admitting: Internal Medicine

## 2011-09-04 ENCOUNTER — Encounter (HOSPITAL_COMMUNITY)
Admission: RE | Admit: 2011-09-04 | Discharge: 2011-09-04 | Disposition: A | Discharge status: Home / Self Care | Payer: Medicare Other | Source: Ambulatory Visit | Attending: Internal Medicine | Admitting: Internal Medicine

## 2011-09-09 ENCOUNTER — Encounter (HOSPITAL_COMMUNITY)
Admission: RE | Admit: 2011-09-09 | Discharge: 2011-09-09 | Disposition: A | Discharge status: Home / Self Care | Payer: Medicare Other | Source: Ambulatory Visit | Attending: Internal Medicine | Admitting: Internal Medicine

## 2011-09-10 ENCOUNTER — Telehealth: Payer: Self-pay | Admitting: Internal Medicine

## 2011-09-10 MED ORDER — BUDESONIDE-FORMOTEROL FUMARATE 160-4.5 MCG/ACT IN AERO: INHALATION_SPRAY | RESPIRATORY_TRACT | Status: DC

## 2011-09-10 MED ORDER — ALBUTEROL SULFATE HFA 108 (90 BASE) MCG/ACT IN AERS: 2.0000 | INHALATION_SPRAY | Freq: Four times a day (QID) | RESPIRATORY_TRACT | Status: DC | PRN

## 2011-09-10 NOTE — Telephone Encounter (Signed)
LMOM that refills were sent to pharmacy for Ventolin and Symbicort.

## 2011-09-11 ENCOUNTER — Encounter (HOSPITAL_COMMUNITY): Payer: Medicare Other

## 2011-09-11 ENCOUNTER — Telehealth: Payer: Self-pay | Admitting: Internal Medicine

## 2011-09-11 DIAGNOSIS — J8410 Pulmonary fibrosis, unspecified: Secondary | ICD-10-CM

## 2011-09-12 NOTE — Telephone Encounter (Signed)
MW, do you have the forms for patient to come pick up and then complete her part?

## 2011-09-12 NOTE — Telephone Encounter (Signed)
Pt's son [name] stated that pt did not quite understand message after speaking w/ Crystal.  Would like to speak w/ Crystal & clarify.  [name] asked to also be reached at [phone].  Satira Anis

## 2011-09-12 NOTE — Telephone Encounter (Signed)
ALSO: pt's son alex called today to add that [facility] needs an order re: switch from regular tank to portable O2. [name]'s # [id number]. Please call him when this has been done. Mariann Laster

## 2011-09-12 NOTE — Telephone Encounter (Signed)
Called home # - [email or web address] Called [phone] - NA and no option to leave msg  I clarified with pt that she wanted portable o2 and advised the paperwork from [place] has been placed in Dr. Gustavus Bryant to do.

## 2011-09-12 NOTE — Telephone Encounter (Signed)
They were left in my "do" list for Magda Paganini but she left for [facility] before getting to it so will need to address this am 5/20

## 2011-09-12 NOTE — Telephone Encounter (Signed)
Called, spoke with pt - she would like an order sent to [facility] for portable o2.  Dr. Melvyn Novas, are you ok with this?  Also, the forms pt left for [facility] are in your to do.  Thank you.

## 2011-09-12 NOTE — Telephone Encounter (Signed)
Done but needs med records too and there's lots on the forms that is hers to fill out

## 2011-09-15 NOTE — Telephone Encounter (Signed)
Forms given to [facility] and referral was made. I called and spoke with Daughter in law and made aware that this is being handled. She verbalized understanding and states nothing further needed.

## 2011-09-15 NOTE — Telephone Encounter (Signed)
Son calling back.  If no answer at home # call [phone].

## 2011-09-16 ENCOUNTER — Encounter (HOSPITAL_COMMUNITY)
Admission: RE | Admit: 2011-09-16 | Discharge: 2011-09-16 | Disposition: A | Discharge status: Home / Self Care | Payer: Medicare Other | Source: Ambulatory Visit | Attending: Internal Medicine | Admitting: Internal Medicine

## 2011-09-18 ENCOUNTER — Encounter (HOSPITAL_COMMUNITY)
Admission: RE | Admit: 2011-09-18 | Discharge: 2011-09-18 | Disposition: A | Discharge status: Home / Self Care | Payer: Medicare Other | Source: Ambulatory Visit | Attending: Internal Medicine | Admitting: Internal Medicine

## 2011-09-23 ENCOUNTER — Telehealth: Payer: Self-pay | Admitting: Internal Medicine

## 2011-09-23 ENCOUNTER — Encounter (HOSPITAL_COMMUNITY): Payer: Medicare Other

## 2011-09-23 NOTE — Telephone Encounter (Signed)
All she needs for her doctor is a copy of the last ov

## 2011-09-23 NOTE — Telephone Encounter (Signed)
LMTCB x 1 

## 2011-09-23 NOTE — Telephone Encounter (Signed)
Copy printed and placed at front. Pt son states he thinks this will suffice. Shoshone Bing, CMA

## 2011-09-23 NOTE — Telephone Encounter (Signed)
I spoke with the pt son and he states the pt is traveling to [place]. He states they have arranged for her to see a MD there, but the MD is requesting that the pt bring a letter stating that she needs oxygen 2 liters with exertion and her diagnosis. He states this is not for the airline, it is for the doctor that will be treating her while she is in [place]. Please advise.   Also the pt is requesting refills on omeprazole and ventolin which were sent on 09-10-11. Pt is aware. Silverton Bing, CMA

## 2011-09-24 ENCOUNTER — Telehealth: Payer: Self-pay | Admitting: Internal Medicine

## 2011-09-24 NOTE — Telephone Encounter (Signed)
Looks as though Rx was sent to pharmacy on 09-10-11 with 3 refills; I spoke with Nira Conn at [facility] pharmacy-will get refill ready for patient to pick up. I left message on [name]'s phone that refill was ready at pharmacy and if any other questions or concerns to please call our office.

## 2011-09-25 ENCOUNTER — Encounter (HOSPITAL_COMMUNITY)
Admission: RE | Admit: 2011-09-25 | Discharge: 2011-09-25 | Disposition: A | Discharge status: Home / Self Care | Payer: Medicare Other | Source: Ambulatory Visit | Attending: Internal Medicine | Admitting: Internal Medicine

## 2011-09-25 NOTE — Progress Notes (Addendum)
Pulmonary Rehabilitation Program Outcomes Report   Orientation:  07/02/2011 Graduate Date:  09/25/2011 Discharge Date:  09/25/2011 # of sessions completed: 22  Pulmonologist: Dr. Melvyn Novas  Class Time:  10:30am  A.  Exercise Program:  Tolerates exercise @ 3.0 METS for 45 minutes, Walk Test Results:  Pre: 92f and Post: 11976f Improved functional capacity  31.64 %, Improved  muscular strength  38.10 %, Exercise limited by dyspnea, Discharged to home exercise program.  Anticipated compliance:  excellent and Discharged  B.  Mental Health:  Good mental attitude and Health related anxiety  C.  Education/Instruction/Skills  Uses Perceived Exertion Scale and/or Dyspnea Scale and Attended 10 education classes  Home exercise given: 07/29/2011  D.  Nutrition/Weight Control/Body Composition:  Pt following a healthy diet as prescribed. % Body Fat  35.0 and Evidence of fat weight loss Section Completed by: EdDerek MoundRD, LDN, CDE 09/25/11 1355 E.  Blood Lipids No results found for this basename: CHOL, HDL, LDLCALC, LDLDIRECT, TRIG, CHOLHDL   F.  Lifestyle Changes:  Making positive lifestyle changes  G.  Symptoms noted with exercise:  Shortness of breath, Dizziness and Fatigue  Patient did well with exercise program. Patient is going to [place] visit family for a month and return to the Pulmonary Rehab Maintenance program. Tolerated well with all exercises and VSS. Great to work with.   Report Completed By: CoLenise ArenaBrOwens SharkMS, NASM, CES            Agree with above.  PhLeverne HumblesN

## 2011-09-30 ENCOUNTER — Encounter (HOSPITAL_COMMUNITY): Payer: Medicare Other

## 2011-10-02 ENCOUNTER — Encounter (HOSPITAL_COMMUNITY): Payer: Medicare Other

## 2011-10-07 ENCOUNTER — Encounter (HOSPITAL_COMMUNITY): Payer: Medicare Other

## 2011-10-09 ENCOUNTER — Encounter (HOSPITAL_COMMUNITY): Payer: Medicare Other

## 2011-10-14 ENCOUNTER — Encounter (HOSPITAL_COMMUNITY): Payer: Medicare Other

## 2011-10-16 ENCOUNTER — Encounter (HOSPITAL_COMMUNITY): Payer: Medicare Other

## 2011-10-21 ENCOUNTER — Encounter (HOSPITAL_COMMUNITY): Payer: Medicare Other

## 2012-07-28 ENCOUNTER — Ambulatory Visit: Payer: Medicare Other | Admitting: Internal Medicine

## 2012-08-10 ENCOUNTER — Ambulatory Visit (INDEPENDENT_AMBULATORY_CARE_PROVIDER_SITE_OTHER): Payer: Medicare Other | Admitting: Internal Medicine

## 2012-08-10 ENCOUNTER — Ambulatory Visit (INDEPENDENT_AMBULATORY_CARE_PROVIDER_SITE_OTHER)
Admission: RE | Admit: 2012-08-10 | Discharge: 2012-08-10 | Disposition: A | Discharge status: Home / Self Care | Payer: Medicare Other | Source: Ambulatory Visit | Attending: Internal Medicine | Admitting: Internal Medicine

## 2012-08-10 ENCOUNTER — Other Ambulatory Visit (INDEPENDENT_AMBULATORY_CARE_PROVIDER_SITE_OTHER): Payer: Medicare Other

## 2012-08-10 ENCOUNTER — Encounter: Payer: Self-pay | Admitting: Internal Medicine

## 2012-08-10 VITALS — BP 108/62 | HR 85 | Temp 97.4°F | Ht 60.0 in | Wt 111.0 lb

## 2012-08-10 DIAGNOSIS — J8410 Pulmonary fibrosis, unspecified: Secondary | ICD-10-CM

## 2012-08-10 DIAGNOSIS — R05 Cough: Secondary | ICD-10-CM

## 2012-08-10 DIAGNOSIS — J9610 Chronic respiratory failure, unspecified whether with hypoxia or hypercapnia: Secondary | ICD-10-CM

## 2012-08-10 LAB — CBC WITH DIFFERENTIAL/PLATELET
Basophils Relative: 0.5 % (ref 0.0–3.0)
Eosinophils Relative: 4 % (ref 0.0–5.0)
Hemoglobin: 11.8 g/dL — ABNORMAL LOW (ref 12.0–15.0)
Lymphocytes Relative: 32.6 % (ref 12.0–46.0)
MCV: 82.6 fl (ref 78.0–100.0)
Neutro Abs: 7 10*3/uL (ref 1.4–7.7)
Neutrophils Relative %: 57.3 % (ref 43.0–77.0)
RBC: 4.38 Mil/uL (ref 3.87–5.11)
WBC: 12.1 10*3/uL — ABNORMAL HIGH (ref 4.5–10.5)

## 2012-08-10 LAB — BASIC METABOLIC PANEL
GFR: 104.01 mL/min (ref >60.00)
Potassium: 3.9 mEq/L (ref 3.5–5.1)
Sodium: 132 mEq/L — ABNORMAL LOW (ref 135–145)

## 2012-08-10 LAB — HEPATIC FUNCTION PANEL
ALT: 12 U/L (ref 0–35)
Total Protein: 8.7 g/dL — ABNORMAL HIGH (ref 6.0–8.3)

## 2012-08-10 MED ORDER — BREATHERITE COLL SPACER ADULT MISC: Status: DC

## 2012-08-10 MED ORDER — PREDNISONE 10 MG PO TABS: ORAL_TABLET | ORAL | Status: DC

## 2012-08-10 NOTE — Progress Notes (Signed)
Subjective:     Patient ID: Donna Watts, female   DOB: Mar 11, 1930 .   MRN: [id number]  Brief patient profile:  Primary Provider/Referring Provider: Dr Mertha Finders   HPI 68 yowf never smoker mother of a [place] PA with h/o cough onset around 2005 with a cxr showing scarring per Dr Inda Merlin office records c/w PF  October 12, 2008 ov at request of Kozlow with worse cough winter 2010 and after prednisone seemed better except still cough at hs. mostly dry. 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  rec  Stop fish oil  continue omeprazole but take one 30-60 min before first meal of the day  for cough try delsym 2 tsp every 12   November 09, 2008 cough much better, doe minimal improvement certainly no worse. no sign arthritis or dysphagia.  rec continue omeprazole Take one 30-60 min before first meal of the day  Please schedule a follow-up appointment in 3 months with CXR sooner if breathing or cough worsen  NO MACRODANTIN   September 2010 flew to [place] one month after landed increase cough esp at bedtime better after rx which included European version of symbicort which is dpi. Back to baseline doe, no cough.    09/25/2010 ov/Wert cc cough and breathing better but using symbicort dpi from [place]. Sleeping ok without nocturnal  or early am exac of resp c/o's.   rec no change in rx.  Work on inhaler technique but use symbicort hfa because we don't have the dpi version here.    12/02/2010 f/u ov/Wert cc doe x 20 min s stopping.  No sign cough.  rec Work on inhaler technique:  Follow up due when you return from [place] to [place] in [place] 2 days arrived from [place] where did not feel she had same stamina due to sob with dx flu vs pna and was discharged on 2lpm with exertion outside the house but rarely inside house off 02.  07/04/2011 f/u ov/Wert cc doe better on 02.  Still struggling with inhalers despite spacer and thoroughly confused with the instructions she's  received from multiple doctors and fm members "everyone has an opinion on the right way to do things- says she was taught if the spacer didn't honk she was doing it wrong."  Cough ? Better p albuterol mostly dry. No active sinus or reflux symptoms rec Work on inhaler technique:    Symbicort 160 Take 2 puffs first thing in am and then another 2 puffs about 12 hours later.  Only use your albuterol (blue inhaler/ ventolin)  as a rescue medication  For cough use mucinex dm and if still coughing then add the tramadol 50 mg every 4 hours as needed. Late add:   Should continue ppi and h2 hs until next ov   08/15/2011 f/u ov/Wert cc breathing some better . No overt hb or sinus complaints rec Ok with me to go to [facility] to see if qualifies for any studies but no change in recs  08/29/2011 f/u ov/Wert cc much better p rx for uri with abx and prednisone( completed 4/27) with no cough and baseline doe x outdoors walking better on 3lpm np.  rec Wear 02 2lpm with walking outside the house and 3lpm when really exerting  Eval at [facility] did not qualify for any studies  08/10/2012 f/u ov/Wert re pf Chief Complaint  Patient presents with  . Follow-up    pt just returned from [place] and  was in the hospital there for pneumonia.    albuterol helps but she really isn't able to inhale more than 5% of the dose due to poor mdi  overall better since admit in [place] now 02 at  2.5 liter and 3-4 liters when exerting but still desats on 3lpm on symbicort and budesonide per [place].    No significant variability to doe nor any assoc cough or over hb or sinus complaints or daytime need for saba or cough suppression.   Sleeping ok without nocturnal  or early am exacerbation  of respiratory  c/o's or need for noct saba. Also denies any obvious fluctuation of symptoms with weather or environmental changes or other aggravating or alleviating factors except as outlined above   ROS  The following are not active complaints  unless bolded sore throat, dysphagia, dental problems, itching, sneezing,  nasal congestion or excess/ purulent secretions, ear ache,   fever, chills, sweats, unintended wt loss, pleuritic or exertional cp, hemoptysis,  orthopnea pnd or leg swelling, presyncope, palpitations, heartburn, abdominal pain, anorexia, nausea, vomiting, diarrhea  or change in bowel or urinary habits, change in stools or urine, dysuria,hematuria,  rash, arthralgias, visual complaints, headache, numbness weakness or ataxia or problems with walking or coordination,  change in mood/affect or memory.           Past Medical History:  Pulmonary Fibrosis..................................................Marland KitchenWert  - PFT's November 09, 2008 VC 2.03 ( 92%)  DLC0 52%  - PFT's October 25, 2009 VC 2.0 (92%)    DLC0 58%  -PFT's  09/25/10            VC 1.88 (86%)  DLCO 54 Hyperlipidemia  GERD  - ? rx to pepcid > try off Sep 07, 2009 resolved Pneumovax Jan 2013           Objective:   Physical Exam    wt 140 November 09, 2008 > 141 May 24, 2009 >>144 July 26, 2009> 140 08/08/2010 > 136 08/27/2010 > 134 09/25/2010 > 131 12/02/2010  > 07/04/2011 126 > 08/15/2011  124 > 124 08/29/2011 > 08/10/2012  111 HEENT: nl dentition, turbinates, and orophanx. Nl external ear canals without cough reflex  NECK : without JVD/Nodes/TM/ nl carotid upstrokes bilaterally  LUNGS: no acc muscle use, classic dry crackles on insp mostly bases bilaterally with  no cough on insp  CV: RRR no s3 or murmur or increase in P2, no edema  ABD: soft and nontender with nl excursion in the supine position. No bruits or organomegaly, bowel sounds nl  MS: warm without deformities, calf tenderness, cyanosis or clubbing  CXR  08/10/2012 : Diffuse right greater than left pulmonary fibrosis without acute cardiopulmonary disease.  ESR 85 08/10/12     Assessment:         Plan:

## 2012-08-10 NOTE — Patient Instructions (Addendum)
Prednisone 10  Mg 2 daily until breathing  Better, then 10 mg daily x 5 days   Ok to try spacer but need to return with all medications in hand   Please remember to go to the   x-ray department downstairs for your tests - we will call you with the results when they are available.  Please schedule a follow up office visit in 4 weeks, sooner if needed

## 2012-08-11 ENCOUNTER — Encounter: Payer: Self-pay | Admitting: Internal Medicine

## 2012-08-11 NOTE — Assessment & Plan Note (Addendum)
-  PFT's November 09, 2008 VC 2.03 ( 92%) DLC0 52%  - PFT's October 25, 2009 VC 2.0 (92%)   DLC0 58%  -PFT's  09/25/10            VC1.88 (86%)  DLCO 54 - Ex desat documented 11/09/08 x 1 lap > 84% p 2 laps October 25, 2009 > 84% p one lap 08/27/2010     > 87 p 2 laps RA 09/25/2010 > 83% p 2 laps at a very rapid pace 12/02/10 rec pace or wear 02, wants to try pacing - 07/04/2011   Walked RA x one lap @ 185 stopped due to  sats 87% at nl pace > 2lpm - 08/29/2011   Walked RA x one lap @ 185 stopped due to  desat to 88, improved but not eliminated on 2lpm so rec use 3lpm with ex - 08/10/13 ESR 85 > short term prednisone rx  The goal with a chronic steroid dependent illness is always arriving at the lowest effective dose that controls the disease/symptoms and not accepting a set "formula" which is based on statistics or guidelines that don't always take into account patient  variability or the natural hx of the dz in every individual patient, which may well vary over time.  For now therefore I recommend the patient maintain    The goal with a chronic steroid dependent illness is always arriving at the lowest effective dose that controls the disease/symptoms and not accepting a set "formula" which is based on statistics or guidelines that don't always take into account patient  variability or the natural hx of the dz in every individual patient, which may well vary over time.  For now therefore I recommend the patient maintain only short term prednisone rx until have time to regroup.

## 2012-08-11 NOTE — Assessment & Plan Note (Addendum)
Says albuterol helps so try via spacer   The proper method of use, as well as anticipated side effects, of a metered-dose inhaler are discussed and demonstrated to the patient. Improved effectiveness after extensive coaching during this visit to a level of approximately  50% even with spacer

## 2012-08-11 NOTE — Assessment & Plan Note (Signed)
-  07/04/2011   Walked RA x one lap @ 185 stopped due to  sats 87% at nl pace > 2lpm - 08/29/2011    Walked RA x one lap @ 185 stopped due to  desat to 88% better on 2lpm, eliminated on 3lpm  rx 2.5 lpm and up to 4lpm with exertion (self monitors)

## 2012-08-12 ENCOUNTER — Telehealth: Payer: Self-pay | Admitting: Internal Medicine

## 2012-08-12 NOTE — Telephone Encounter (Signed)
I spoke with son and notified that all of the records were already sent to [facility] per [facility] but they are going to call and make sure they sent everything that was needed I also notified of lab results per MW He verbalized understanding  Will inform the pt

## 2012-08-16 ENCOUNTER — Telehealth: Payer: Self-pay | Admitting: Internal Medicine

## 2012-08-16 NOTE — Progress Notes (Signed)
Quick Note:  Pt notified of results ______

## 2012-08-16 NOTE — Telephone Encounter (Signed)
ATC NA WCB

## 2012-08-17 NOTE — Telephone Encounter (Signed)
Spoke with patient (as Donna Watts was not home),was reached @ [phone]. Informed patient that she would need to contact our Medical Records Dept in order to have all her medical records form our office released to [facility]. I have given patient the number and she has verbalized understanding. Nothing further needed at this time.

## 2012-08-20 ENCOUNTER — Ambulatory Visit (HOSPITAL_COMMUNITY)
Admission: RE | Admit: 2012-08-20 | Discharge: 2012-08-20 | Disposition: A | Discharge status: Home / Self Care | Payer: Medicare Other | Source: Ambulatory Visit | Attending: Family Medicine | Admitting: Family Medicine

## 2012-08-20 DIAGNOSIS — Z5189 Encounter for other specified aftercare: Secondary | ICD-10-CM | POA: Insufficient documentation

## 2012-08-20 DIAGNOSIS — J8410 Pulmonary fibrosis, unspecified: Secondary | ICD-10-CM | POA: Insufficient documentation

## 2012-08-20 LAB — BLOOD GAS, ARTERIAL
Drawn by: 24485
pCO2 arterial: 43.9 mmHg (ref 35.0–45.0)
pH, Arterial: 7.411 (ref 7.350–7.450)
pO2, Arterial: 64.7 mmHg — ABNORMAL LOW (ref 80.0–100.0)

## 2012-08-24 NOTE — Progress Notes (Signed)
Quick Note:  Spoke with pt and notified of results per Dr. Wert. Pt verbalized understanding and denied any questions.  ______ 

## 2012-08-30 ENCOUNTER — Telehealth: Payer: Self-pay | Admitting: Internal Medicine

## 2012-08-30 DIAGNOSIS — J8410 Pulmonary fibrosis, unspecified: Secondary | ICD-10-CM

## 2012-08-30 NOTE — Telephone Encounter (Signed)
Fine with me

## 2012-08-30 NOTE — Telephone Encounter (Signed)
I spoke with Donna Watts. He stated [facility] needs a more update PFT test and he needs to get this scheduled. MW are you okay if we schedule this? Please advise thanks

## 2012-08-30 NOTE — Telephone Encounter (Signed)
Order placed and appt scheduled with alex. Nothing further was needed

## 2012-09-09 ENCOUNTER — Ambulatory Visit (INDEPENDENT_AMBULATORY_CARE_PROVIDER_SITE_OTHER): Payer: Medicare Other | Admitting: Internal Medicine

## 2012-09-09 ENCOUNTER — Encounter: Payer: Self-pay | Admitting: Internal Medicine

## 2012-09-09 VITALS — BP 122/60 | HR 88 | Temp 97.6°F | Ht 60.0 in | Wt 115.4 lb

## 2012-09-09 DIAGNOSIS — R05 Cough: Secondary | ICD-10-CM

## 2012-09-09 DIAGNOSIS — J9610 Chronic respiratory failure, unspecified whether with hypoxia or hypercapnia: Secondary | ICD-10-CM

## 2012-09-09 DIAGNOSIS — J8410 Pulmonary fibrosis, unspecified: Secondary | ICD-10-CM

## 2012-09-09 MED ORDER — BUDESONIDE-FORMOTEROL FUMARATE 160-4.5 MCG/ACT IN AERO: INHALATION_SPRAY | RESPIRATORY_TRACT | Status: DC

## 2012-09-09 MED ORDER — ALBUTEROL SULFATE HFA 108 (90 BASE) MCG/ACT IN AERS: 2.0000 | INHALATION_SPRAY | Freq: Four times a day (QID) | RESPIRATORY_TRACT | Status: DC | PRN

## 2012-09-09 NOTE — Progress Notes (Signed)
Subjective:     Patient ID: Donna Watts, female   DOB: 1929-06-03 .   MRN: [id number]  Brief patient profile:  Primary Provider/Referring Provider: Dr Mertha Finders   HPI 35 yowf never smoker mother of a Webb PA with h/o cough onset around 2005 with a cxr showing scarring per Dr Inda Merlin office records c/w PF  October 12, 2008 ov at request of Kozlow with worse cough winter 2010 and after prednisone seemed better except still cough at hs. mostly dry. 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  rec  Stop fish oil  continue omeprazole but take one 30-60 min before first meal of the day  for cough try delsym 2 tsp every 12   November 09, 2008 cough much better, doe minimal improvement certainly no worse. no sign arthritis or dysphagia.  rec continue omeprazole Take one 30-60 min before first meal of the day  Please schedule a follow-up appointment in 3 months with CXR sooner if breathing or cough worsen  NO MACRODANTIN   September 2010 flew to [place] one month after landed increase cough esp at bedtime better after rx which included European version of symbicort which is dpi. Back to baseline doe, no cough.    09/25/2010 ov/Jayni Prescher cc cough and breathing better but using symbicort dpi from [place]. Sleeping ok without nocturnal  or early am exac of resp c/o's.   rec no change in rx.  Work on inhaler technique but use symbicort hfa because we don't have the dpi version here.    12/02/2010 f/u ov/Pepe Mineau cc doe x 20 min s stopping.  No sign cough.  rec Work on inhaler technique:  Follow up due when you return from [place] to [place] in [place] 2 days arrived from [place] where did not feel she had same stamina due to sob with dx flu vs pna and was discharged on 2lpm with exertion outside the house but rarely inside house off 02.  07/04/2011 f/u ov/Ewan Grau cc doe better on 02.  Still struggling with inhalers despite spacer and thoroughly confused with the instructions she's  received from multiple doctors and fm members "everyone has an opinion on the right way to do things- says she was taught if the spacer didn't honk she was doing it wrong."  Cough ? Better p albuterol mostly dry. No active sinus or reflux symptoms rec Work on inhaler technique:    Symbicort 160 Take 2 puffs first thing in am and then another 2 puffs about 12 hours later.  Only use your albuterol (blue inhaler/ ventolin)  as a rescue medication  For cough use mucinex dm and if still coughing then add the tramadol 50 mg every 4 hours as needed. Late add:   Should continue ppi and h2 hs until next ov   08/15/2011 f/u ov/Betsie Peckman cc breathing some better . No overt hb or sinus complaints rec Ok with me to go to [facility] to see if qualifies for any studies but no change in recs  08/29/2011 f/u ov/Savien Mamula cc much better p rx for uri with abx and prednisone( completed 4/27) with no cough and baseline doe x outdoors walking better on 3lpm np.  rec Wear 02 2lpm with walking outside the house and 3lpm when really exerting Eval at [facility] did not qualify for any studies  08/10/2012 f/u ov/Dalessandro Baldyga re pf Chief Complaint  Patient presents with  . Follow-up    pt just returned from [place] and was  in the hospital there for pneumonia.    albuterol helps but she really isn't able to inhale more than 5% of the dose due to poor mdi  overall better since admit in [place] now 02 at  2.5 liter and 3-4 liters when exerting but still desats on 3lpm on symbicort and budesonide per [place].   rec Prednisone 10  Mg 2 daily until breathing  Better, then 10 mg daily x 5 days and stop Ok to try spacer but need to return with all medications in hand    09/09/2012 f/u ov/Arron Tetrault re PF Chief Complaint  Patient presents with  . Follow-up    Pt c/o having increased SOB with humid weather. Has dry cough off and on during the day.    cough much better on prednisone, slt  worse off it despite symbiocort dpi 320 one bid, no need for  saba Using 02 4lpm walking outside, sitting still on 2lpm Able to walk up to 0.8 miles on am of ov and sats ok on 4lpm, self titrating 02 says sats were fine on 4lpm walking     No significant variability to doe nor any assoc excess mucus production or overt hb or sinus complaints or daytime need for saba or cough suppression.   Sleeping ok without nocturnal  or early am exacerbation  of respiratory  c/o's or need for noct saba. Also denies any obvious fluctuation of symptoms with weather or environmental changes or other aggravating or alleviating factors except as outlined above   Current Medications, Allergies, Past Medical History, Past Surgical History, Family History, and Social History were reviewed in [employer] record.  ROS  The following are not active complaints unless bolded sore throat, dysphagia, dental problems, itching, sneezing,  nasal congestion or excess/ purulent secretions, ear ache,   fever, chills, sweats, unintended wt loss, pleuritic or exertional cp, hemoptysis,  orthopnea pnd or leg swelling, presyncope, palpitations, heartburn, abdominal pain, anorexia, nausea, vomiting, diarrhea  or change in bowel or urinary habits, change in stools or urine, dysuria,hematuria,  rash, arthralgias, visual complaints, headache, numbness weakness or ataxia or problems with walking or coordination,  change in mood/affect or memory.               Past Medical History:  Pulmonary Fibrosis..................................................Marland KitchenWert  - PFT's November 09, 2008 VC 2.03 ( 92%)  DLC0 52%  - PFT's October 25, 2009 VC 2.0 (92%)    DLC0 58%  -PFT's  09/25/10            VC 1.88 (86%)  DLCO 54 Hyperlipidemia  GERD  - ? rx to pepcid > try off Sep 07, 2009 resolved Pneumovax Jan 2013           Objective:   Physical Exam   amb elderly wf speaks slt broken English accompanied by son   wt 140 November 09, 2008 > 141 May 24, 2009 >>144 July 26, 2009> 140  08/08/2010 > 136 08/27/2010 > 134 09/25/2010 > 131 12/02/2010  > 07/04/2011 126 > 08/15/2011  124 > 124 08/29/2011 > 08/10/2012  111 > 09/09/2012  HEENT: nl dentition, turbinates, and orophanx. Nl external ear canals without cough reflex  NECK : without JVD/Nodes/TM/ nl carotid upstrokes bilaterally  LUNGS: no acc muscle use, classic dry crackles on insp   bilaterally with  no cough on insp  CV: RRR no s3 or murmur or increase in P2, no edema  ABD: soft and nontender with nl excursion in  the supine position. No bruits or organomegaly, bowel sounds nl  MS: warm without deformities, calf tenderness, cyanosis or clubbing  CXR  08/10/2012 : Diffuse right greater than left pulmonary fibrosis without acute cardiopulmonary disease.  ESR 85 08/10/12     Assessment:         Plan:

## 2012-09-09 NOTE — Assessment & Plan Note (Signed)
-  07/04/2011   Walked RA x one lap @ 185 stopped due to  sats 87% at nl pace > 2lpm - 08/29/2011    Walked RA x one lap @ 185 stopped due to  desat to 88% better on 2lpm, eliminated on 3lpm  rx 2.5 lpm and up to 4lpm with exertion (self monitors)  Walked 0.8 miles today on 4lpm off prednisone so no plans to change rx at this point

## 2012-09-09 NOTE — Patient Instructions (Addendum)
Keep appt for the [facility] and return here as needed  symbicort hfa is Take 2 puffs first thing in am and then another 2 puffs about 12 hours later through spacer   Only use your albuterol (ventolin)  as a rescue medication to be used if you can't catch your breath by resting or doing a relaxed purse lip breathing pattern. The less you use it, the better it will work when you need it.

## 2012-09-09 NOTE — Assessment & Plan Note (Signed)
-  09/09/2012 HFA 50% with spacer 90% with dpi symbicort  > try change to hfa symbicort 160 2bid when runs out of the dpi to see if it really helps breathing, cough, and reduces ventolin need (remains to be seen whether she really has any airway issues at all.

## 2012-09-09 NOTE — Assessment & Plan Note (Signed)
-  PFT's November 09, 2008 VC 2.03 ( 92%) DLC0 52%  - PFT's October 25, 2009 VC 2.0 (92%)   DLC0 58%  -PFT's  09/25/10            VC1.88 (86%)  DLCO 54 - Ex desat documented 11/09/08 x 1 lap > 84% p 2 laps October 25, 2009 > 84% p one lap 08/27/2010     > 87 p 2 laps RA 09/25/2010 > 83% p 2 laps at a very rapid pace 12/02/10 rec pace or wear 02, wants to try pacing - 07/04/2011   Walked RA x one lap @ 185 stopped due to  sats 87% at nl pace > 2lpm - 08/29/2011   Walked RA x one lap @ 185 stopped due to  desat to 88, improved but not eliminated on 2lpm so rec use 3lpm with ex - 08/10/13 ESR 85 >   Prednisone short course only   She does have a very high esr and gets better on relatively low doses of prednisone but wants to avoid chronic rx if possible - may be a candidate for perphenidone when approved but defer decision to treat to [facility] pf clinic and we'll see her back here prn

## 2012-09-10 ENCOUNTER — Ambulatory Visit (INDEPENDENT_AMBULATORY_CARE_PROVIDER_SITE_OTHER): Payer: Medicare Other | Admitting: Internal Medicine

## 2012-09-10 ENCOUNTER — Telehealth: Payer: Self-pay | Admitting: Internal Medicine

## 2012-09-10 DIAGNOSIS — J8410 Pulmonary fibrosis, unspecified: Secondary | ICD-10-CM

## 2012-09-10 LAB — PULMONARY FUNCTION TEST

## 2012-09-10 NOTE — Telephone Encounter (Signed)
Called, spoke with pt as son is not available right now. She has PFTs done today. She would like them faxed to [facility] at above fax # and would like a copy mailed to her home.  I verified her mailing address is correct in chart. Magda Paganini, will you pls make sure this gets done after Dr. Melvyn Novas addresses the results?  Thank you.

## 2012-09-10 NOTE — Progress Notes (Signed)
PFT done today. 

## 2012-09-13 NOTE — Telephone Encounter (Signed)
Done-copy mailed to the pt and copy faxed to [facility]

## 2012-09-16 ENCOUNTER — Telehealth: Payer: Self-pay | Admitting: Internal Medicine

## 2012-09-16 DIAGNOSIS — J8410 Pulmonary fibrosis, unspecified: Secondary | ICD-10-CM

## 2012-09-16 DIAGNOSIS — J9610 Chronic respiratory failure, unspecified whether with hypoxia or hypercapnia: Secondary | ICD-10-CM

## 2012-09-16 NOTE — Telephone Encounter (Signed)
I spoke with mandy. She needs order d.t pt changing from POC to portable tanks.I have sent this over to [facility] nothing further was needed

## 2012-09-22 ENCOUNTER — Encounter: Payer: Self-pay | Admitting: Internal Medicine

## 2012-09-23 ENCOUNTER — Telehealth: Payer: Self-pay | Admitting: Internal Medicine

## 2012-09-23 DIAGNOSIS — R0902 Hypoxemia: Secondary | ICD-10-CM | POA: Insufficient documentation

## 2012-09-23 DIAGNOSIS — Z7682 Awaiting organ transplant status: Secondary | ICD-10-CM | POA: Insufficient documentation

## 2012-09-23 DIAGNOSIS — J8410 Pulmonary fibrosis, unspecified: Secondary | ICD-10-CM

## 2012-09-23 DIAGNOSIS — E119 Type 2 diabetes mellitus without complications: Secondary | ICD-10-CM | POA: Insufficient documentation

## 2012-09-23 DIAGNOSIS — R0600 Dyspnea, unspecified: Secondary | ICD-10-CM | POA: Insufficient documentation

## 2012-09-23 NOTE — Telephone Encounter (Signed)
LMTCB for [employer]

## 2012-09-24 ENCOUNTER — Encounter: Payer: Self-pay | Admitting: Internal Medicine

## 2012-09-24 NOTE — Telephone Encounter (Signed)
lmtcb x2 w/ pt

## 2012-09-24 NOTE — Telephone Encounter (Signed)
Pt returned call

## 2012-09-27 ENCOUNTER — Telehealth: Payer: Self-pay | Admitting: Internal Medicine

## 2012-09-27 NOTE — Telephone Encounter (Signed)
Will send chart to review to see if we can get her on perfenidone until it becomes commercially available

## 2012-09-27 NOTE — Telephone Encounter (Signed)
i spoke with son and is aware. Nothing further was needed

## 2012-09-27 NOTE — Telephone Encounter (Signed)
I spoke with son. He stated pt needs to get in pulmonary ASAP. He states his sister-in-law spoke with director over at pulmonary rehab and stated they would try to expedite the process for this. Also was advised about the study drug pirfenidone and wants to know if pt would be a candidate. Please advise if okay to order pulmonary rehab MW thanks

## 2012-09-27 NOTE — Telephone Encounter (Signed)
Duplicate message see phone 09/23/12

## 2012-09-29 ENCOUNTER — Telehealth: Payer: Self-pay | Admitting: Internal Medicine

## 2012-09-29 DIAGNOSIS — J8410 Pulmonary fibrosis, unspecified: Secondary | ICD-10-CM

## 2012-09-29 NOTE — Telephone Encounter (Signed)
Pt was last seen on 09/09/2012.  MW- would you be okay with making these orders. Thanks!

## 2012-09-29 NOTE — Telephone Encounter (Signed)
Fine with me but in meantime since no f/u planned by [facility] needs ov with all meds w/in the next month to regroup re longterm rx

## 2012-09-29 NOTE — Telephone Encounter (Signed)
Spoke with [name], made him aware orders were being placed. F/u scheduled for Jun 11,2014 _0  [name] is aware to bring all medications for patient to visit. Nothing further at this time.

## 2012-10-04 ENCOUNTER — Other Ambulatory Visit: Payer: Self-pay | Admitting: Internal Medicine

## 2012-10-04 ENCOUNTER — Encounter (HOSPITAL_COMMUNITY)
Admission: RE | Admit: 2012-10-04 | Discharge: 2012-10-04 | Disposition: A | Discharge status: Home / Self Care | Payer: Medicare Other | Source: Ambulatory Visit | Attending: Internal Medicine | Admitting: Internal Medicine

## 2012-10-04 ENCOUNTER — Encounter (HOSPITAL_COMMUNITY): Payer: Self-pay

## 2012-10-04 ENCOUNTER — Telehealth: Payer: Self-pay | Admitting: *Deleted

## 2012-10-04 DIAGNOSIS — J8410 Pulmonary fibrosis, unspecified: Secondary | ICD-10-CM

## 2012-10-04 DIAGNOSIS — J9610 Chronic respiratory failure, unspecified whether with hypoxia or hypercapnia: Secondary | ICD-10-CM | POA: Insufficient documentation

## 2012-10-04 DIAGNOSIS — Z5189 Encounter for other specified aftercare: Secondary | ICD-10-CM | POA: Insufficient documentation

## 2012-10-04 HISTORY — DX: Type 2 diabetes mellitus without complications: E11.9

## 2012-10-04 NOTE — Telephone Encounter (Signed)
Order was sent to [facility]

## 2012-10-04 NOTE — Telephone Encounter (Signed)
Message copied by Rosana Berger on Mon Oct 04, 2012 11:46 AM ------      Message from: Tanda Rockers      Created: Sat Oct 02, 2012  7:40 AM      Regarding: FW: CT Scan       Needs CT chest High Resolution, no contrast  eval PF      ----- Message -----         From: Noelle Penner, RN         Sent: 10/01/2012   4:36 PM           To: Tanda Rockers, MD, Brand Males, MD      Subject: CT Scan                                                  I have consented this patient for the EAP. Her CT scan is out of window for screening (must be within 4 yrs of screening) so she will need this repeated.  She knows that the study will not pay for this and is okay to proceed.  I will continue with other screening procedures when she sees you on Wednesday June 11 at 1615.  Please have and HCRT ordered as soon as possible.      Thanks,      Jeanett Schlein       ------

## 2012-10-04 NOTE — Progress Notes (Signed)
Late  Entry  2:10 pm to 2:40 pm  :[name] is here today for orientation to Pulmonary Rehab.  She is accompanied by her son [name].  She is thin, pale, using oxygen via nasal canula. She is oriented to x 3, walk with steady gait.  She has some joint swelling in her right hand and left knee that has presented just recently.  She is seeing Dr. Chapman Fitch for this and has an appointment on Friday June13, 2014 for follow up results of tests.  She has also some swelling of her right ankle and a bandage over that area.  Her son states a member of her family is a wound care nurse and has looked at this area, states it is inflamed, not infected.  It is swollen and tender when she ambulates.  Bilateral pulses present. Mild swelling of right ankle.    She says she has been feeling very weak and short of breath and is anxious to get started in Pulmonary Rehab.  Heart rate regular, breath sounds, crackles heard bilateral throughout.  B/P112/50, HR 79, O2 Sat 97 3L [place].  She is very positive, wants to do more at home.  We reviewed her pursed lip breathing technique, discussed department expectations and set goals.  We have delayed her walk test and starting date until she sees Dr. Chapman Fitch and the joint swelling and pain are resolved.  Her son will call and we will then schedule.  We look forward to working with this very nice lady.

## 2012-10-06 ENCOUNTER — Ambulatory Visit (INDEPENDENT_AMBULATORY_CARE_PROVIDER_SITE_OTHER): Payer: Medicare Other | Admitting: Internal Medicine

## 2012-10-06 ENCOUNTER — Encounter: Payer: Self-pay | Admitting: Internal Medicine

## 2012-10-06 ENCOUNTER — Encounter (HOSPITAL_BASED_OUTPATIENT_CLINIC_OR_DEPARTMENT_OTHER): Payer: Medicare Other

## 2012-10-06 VITALS — BP 118/60 | HR 90 | Temp 97.9°F | Ht <= 58 in | Wt 113.0 lb

## 2012-10-06 DIAGNOSIS — J9610 Chronic respiratory failure, unspecified whether with hypoxia or hypercapnia: Secondary | ICD-10-CM

## 2012-10-06 DIAGNOSIS — J8410 Pulmonary fibrosis, unspecified: Secondary | ICD-10-CM

## 2012-10-06 NOTE — Assessment & Plan Note (Addendum)
-  PFT's November 09, 2008 VC 2.03 ( 92%) DLC0 52%  - PFT's October 25, 2009 VC 2.0 (92%)   DLC0 58%  -PFT's  09/25/10            VC1.88 (86%)  DLCO 54 - Ex desat documented 11/09/08 x 1 lap > 84% p 2 laps October 25, 2009 > 84% p one lap 08/27/2010     > 87 p 2 laps RA 09/25/2010 > 83% p 2 laps at a very rapid pace 12/02/10 rec pace or wear 02, wants to try pacing - 07/04/2011   Walked RA x one lap @ 185 stopped due to  sats 87% at nl pace > 2lpm - 08/29/2011   Walked RA x one lap @ 185 stopped due to  desat to 88, improved but not eliminated on 2lpm so rec use 3lpm with ex - 08/10/12 ESR 85 >   Prednisone maint rx  RA does fit for the cause of her PF esp since she appears to have a steroid responsive airway component  though IPF is sometimes assoc with RA like syndromes so will be interested in seeing what her rheumatologist says.  For now however, The goal with a chronic steroid dependent illness is always arriving at the lowest effective dose that controls the disease/symptoms and not accepting a set "formula" which is based on statistics or guidelines that don't always take into account patient  variability or the natural hx of the dz in every individual patient, which may well vary over time.  For now therefore I recommend the patient maintain  A ceiling of 20 mg per day and a floor of 27m.  Since she may have connective tissue dz, will hold off on perfenendone for now.    Each maintenance medication was reviewed in detail including most importantly the difference between maintenance and as needed and under what circumstances the prns are to be used.  Please see instructions for details which were reviewed in writing and the patient given a copy.

## 2012-10-06 NOTE — Progress Notes (Signed)
Subjective:     Patient ID: Donna Watts, female   DOB: 04/10/1930 .   MRN: [id number]  Brief patient profile:  Primary Provider/Referring Provider: Dr Mertha Finders   HPI 57 yowf never smoker mother of a [employer] PA with h/o cough onset around 2005 with a cxr showing scarring per Dr Inda Merlin office records c/w PF  October 12, 2008 ov at request of [place] with worse cough winter 2010 and after prednisone seemed better except still cough at hs. mostly dry. 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  rec  Stop fish oil  continue omeprazole but take one 30-60 min before first meal of the day  for cough try delsym 2 tsp every 12   November 09, 2008 cough much better, doe minimal improvement certainly no worse. no sign arthritis or dysphagia.  rec continue omeprazole Take one 30-60 min before first meal of the day  Please schedule a follow-up appointment in 3 months with CXR sooner if breathing or cough worsen  NO MACRODANTIN   September 2010 flew to [place] one month after landed increase cough esp at bedtime better after rx which included European version of symbicort which is dpi. Back to baseline doe, no cough.    09/25/2010 ov/Savyon Loken cc cough and breathing better but using symbicort dpi from [place]. Sleeping ok without nocturnal  or early am exac of resp c/o's.   rec no change in rx.  Work on inhaler technique but use symbicort hfa because we don't have the dpi version here.    12/02/2010 f/u ov/Mikeya Tomasetti cc doe x 20 min s stopping.  No sign cough.  rec Work on inhaler technique:  Follow up due when you return from [place] to [place] in [place] 2 days arrived from [place] where did not feel she had same stamina due to sob with dx flu vs pna and was discharged on 2lpm with exertion outside the house but rarely inside house off 02.  07/04/2011 f/u ov/Scot Shiraishi cc doe better on 02.  Still struggling with inhalers despite spacer and thoroughly confused with the instructions she's  received from multiple doctors and fm members "everyone has an opinion on the right way to do things- says she was taught if the spacer didn't honk she was doing it wrong."  Cough ? Better p albuterol mostly dry. No active sinus or reflux symptoms rec Work on inhaler technique:    Symbicort 160 Take 2 puffs first thing in am and then another 2 puffs about 12 hours later.  Only use your albuterol (blue inhaler/ ventolin)  as a rescue medication  For cough use mucinex dm and if still coughing then add the tramadol 50 mg every 4 hours as needed. Late add:   Should continue ppi and h2 hs until next ov   08/15/2011 f/u ov/Mariam Helbert cc breathing some better . No overt hb or sinus complaints rec Ok with me to go [facility] to see if qualifies for any studies but no change in recs  08/29/2011 f/u ov/Nyx Keady cc much better p rx for uri with abx and prednisone( completed 4/27) with no cough and baseline doe x outdoors walking better on 3lpm np.  rec Wear 02 2lpm with walking outside the house and 3lpm when really exerting Eval at [facility] did not qualify for any studies  08/10/2012 f/u ov/Lakeia Bradshaw re pf Chief Complaint  Patient presents with  . Follow-up    pt just returned from [place] and was  in the hospital there for pneumonia.    albuterol helps but she really isn't able to inhale more than 5% of the dose due to poor mdi  overall better since admit in [place] now 02 at  2.5 liter and 3-4 liters when exerting but still desats on 3lpm on symbicort and budesonide per [place].   rec Prednisone 10  Mg 2 daily until breathing  Better, then 10 mg daily x 5 days and stop Ok to try spacer but need to return with all medications in hand    09/09/2012 f/u ov/Tarrell Debes re PF Chief Complaint  Patient presents with  . Follow-up    Pt c/o having increased SOB with humid weather. Has dry cough off and on during the day.   cough much better on prednisone, slt  worse off it despite symbiocort dpi 320 one bid, no need for  saba Using 02 4lpm walking outside, sitting still on 2lpm Able to walk up to 0.8 miles on am of ov and sats ok on 4lpm, self titrating 02 says sats were fine on 4lpm walking rec Keep appt for the [facility] and return here as needed symbicort hfa is Take 2 puffs first thing in am and then another 2 puffs about 12 hours later through spacer  Only use your albuterol if needed   10/06/2012 ov/ Nizar Cutler on pred 5 mg per day and 3lpm at rest and 6 with exertion Chief Complaint  Patient presents with  . Follow-up    Pt states her breathing has improved some since her last visit here. She is planning on starting pulmonary rehab in 1 wk. No new co's today.   since last ov tentative dx of ? RA > for rheumatology eval.  Not needing saba daytime   No obvious daytime variabilty or assoc  cough or cp or chest tightness, subjective wheeze overt sinus or hb symptoms. No unusual exp hx or h/o childhood pna/ asthma or premature birth to her knowledge.    Sleeping ok without nocturnal  or early am exacerbation  of respiratory  c/o's or need for noct saba. Also denies any obvious fluctuation of symptoms with weather or environmental changes or other aggravating or alleviating factors except as outlined above   Current Medications, Allergies, Past Medical History, Past Surgical History, Family History, and Social History were reviewed in [employer] record.  ROS  The following are not active complaints unless bolded sore throat, dysphagia, dental problems, itching, sneezing,  nasal congestion or excess/ purulent secretions, ear ache,   fever, chills, sweats, unintended wt loss, pleuritic or exertional cp, hemoptysis,  orthopnea pnd or leg swelling, presyncope, palpitations, heartburn, abdominal pain, anorexia, nausea, vomiting, diarrhea  or change in bowel or urinary habits, change in stools or urine, dysuria,hematuria,  rash, arthralgias, visual complaints, headache, numbness weakness or  ataxia or problems with walking or coordination,  change in mood/affect or memory.               Past Medical History:  Pulmonary Fibrosis..................................................Marland KitchenWert  - PFT's November 09, 2008 VC 2.03 ( 92%)  DLC0 52%  - PFT's October 25, 2009 VC 2.0 (92%)    DLC0 58%  -PFT's  09/25/10            VC 1.88 (86%)  DLCO 54 Hyperlipidemia  GERD  - ? rx to pepcid > try off Sep 07, 2009 resolved Pneumovax Jan 2013           Objective:  Physical Exam   amb elderly wf speaks slt broken English accompanied by son   wt 140 November 09, 2008 > 141 May 24, 2009 >>144 July 26, 2009> 140 08/08/2010 > 136 08/27/2010 > 134 09/25/2010 > 131 12/02/2010  > 07/04/2011 126 > 08/15/2011  124 > 124 08/29/2011 > 08/10/2012  111 > 09/09/2012 > 10/06/2012  113 HEENT: nl dentition, turbinates, and orophanx. Nl external ear canals without cough reflex  NECK : without JVD/Nodes/TM/ nl carotid upstrokes bilaterally  LUNGS: no acc muscle use, classic dry crackles on insp   bilaterally with  no cough on insp  CV: RRR no s3 or murmur or increase in P2, no edema  ABD: soft and nontender with nl excursion in the supine position. No bruits or organomegaly, bowel sounds nl  MS: warm without deformities, calf tenderness, cyanosis or clubbing - no RA deformities  CXR  08/10/2012 : Diffuse right greater than left pulmonary fibrosis without acute cardiopulmonary disease.  ESR 85 08/10/12     Assessment:

## 2012-10-06 NOTE — Patient Instructions (Addendum)
Prednisone 10 mg take 2 daily until better then 1 daily as a floor until seen by rheumatology   We will see you back here after your rheumatology evaluation is complete

## 2012-10-07 ENCOUNTER — Ambulatory Visit (INDEPENDENT_AMBULATORY_CARE_PROVIDER_SITE_OTHER)
Admission: RE | Admit: 2012-10-07 | Discharge: 2012-10-07 | Disposition: A | Discharge status: Home / Self Care | Payer: Medicare Other | Source: Ambulatory Visit | Attending: Internal Medicine | Admitting: Internal Medicine

## 2012-10-07 DIAGNOSIS — J8410 Pulmonary fibrosis, unspecified: Secondary | ICD-10-CM

## 2012-10-08 NOTE — Progress Notes (Signed)
Quick Note:  Spoke with the pt's son and made aware of recs per MW He verbalized understanding and will let the pt know ______

## 2012-10-11 ENCOUNTER — Encounter (HOSPITAL_COMMUNITY)
Admission: RE | Admit: 2012-10-11 | Discharge: 2012-10-11 | Disposition: A | Discharge status: Home / Self Care | Payer: Medicare Other | Source: Ambulatory Visit | Attending: Internal Medicine | Admitting: Internal Medicine

## 2012-10-11 NOTE — Progress Notes (Signed)
Donna Watts arrrived at 4:00 pm for walk test.  She walked 1690 feet.  Oxygen levels stable 93%  To 98% .  BP 124/66-148/68.  She will begin exercise on 10/12/12.  Leverne Humbles RN

## 2012-10-12 ENCOUNTER — Encounter (HOSPITAL_COMMUNITY)
Admission: RE | Admit: 2012-10-12 | Discharge: 2012-10-12 | Disposition: A | Discharge status: Home / Self Care | Payer: Medicare Other | Source: Ambulatory Visit | Attending: Internal Medicine | Admitting: Internal Medicine

## 2012-10-12 NOTE — Progress Notes (Signed)
Donna Watts here today for first day of exercise in Pulmonary Rehab.  She walked track, oxygen saturations maintained above 90% on 6 liters of oxygen. No difficulties with exercise. Encouraged and supported PLB during exercise.

## 2012-10-13 ENCOUNTER — Telehealth: Payer: Self-pay | Admitting: Internal Medicine

## 2012-10-13 MED ORDER — PREDNISONE 10 MG PO TABS: ORAL_TABLET | ORAL | Status: DC

## 2012-10-13 NOTE — Telephone Encounter (Signed)
[id number] x1 

## 2012-10-13 NOTE — Telephone Encounter (Signed)
Patient to see Rheum November 11, 2012  Prednisone 10 mg take 2 daily until symptoms improve, then 1 daily until appt with Rheum. #60 x zero  [facility] [address]

## 2012-10-14 ENCOUNTER — Encounter (HOSPITAL_COMMUNITY)
Admission: RE | Admit: 2012-10-14 | Discharge: 2012-10-14 | Disposition: A | Discharge status: Home / Self Care | Payer: Medicare Other | Source: Ambulatory Visit | Attending: Internal Medicine | Admitting: Internal Medicine

## 2012-10-19 ENCOUNTER — Encounter (HOSPITAL_COMMUNITY): Payer: Medicare Other

## 2012-10-19 ENCOUNTER — Telehealth (HOSPITAL_COMMUNITY): Payer: Self-pay | Admitting: Family Medicine

## 2012-10-21 ENCOUNTER — Encounter (HOSPITAL_COMMUNITY)
Admission: RE | Admit: 2012-10-21 | Discharge: 2012-10-21 | Disposition: A | Discharge status: Home / Self Care | Payer: Medicare Other | Source: Ambulatory Visit | Attending: Internal Medicine | Admitting: Internal Medicine

## 2012-10-21 NOTE — Progress Notes (Signed)
Nutrition Note Spoke with pt. Pt well-known to this Probation officer from previous admission. Pt admission wt 115.5 lb, which is down 9.4 lb over the past year (06/2011 124.9 lb). Pt reports she lost wt in [place] due to illness, depression, and "not liking the food." Pt educated re: High Calorie, High Protein diet. Pt concerned re: cholesterol and DM. Pt concerns addressed. Preventing further wt loss/ promoting wt gain encouraged. Handouts given for High Calorie, High Protein diet, Suggestions for increasing kcal and protein, and recipes given. Pt expressed understanding of the information reviewed. Continue client-centered nutrition education by RD as part of interdisciplinary care.  Monitor and evaluate progress toward nutrition goal with team.  Derek Mound, M.Ed, RD, LDN, CDE 10/21/2012 2:59 PM

## 2012-10-26 ENCOUNTER — Encounter (HOSPITAL_COMMUNITY)
Admission: RE | Admit: 2012-10-26 | Discharge: 2012-10-26 | Disposition: A | Discharge status: Home / Self Care | Payer: Medicare Other | Source: Ambulatory Visit | Attending: Internal Medicine | Admitting: Internal Medicine

## 2012-10-26 DIAGNOSIS — J9610 Chronic respiratory failure, unspecified whether with hypoxia or hypercapnia: Secondary | ICD-10-CM | POA: Insufficient documentation

## 2012-10-26 DIAGNOSIS — Z5189 Encounter for other specified aftercare: Secondary | ICD-10-CM | POA: Insufficient documentation

## 2012-10-26 DIAGNOSIS — J8410 Pulmonary fibrosis, unspecified: Secondary | ICD-10-CM | POA: Insufficient documentation

## 2012-10-28 ENCOUNTER — Encounter (HOSPITAL_COMMUNITY)
Admission: RE | Admit: 2012-10-28 | Discharge: 2012-10-28 | Disposition: A | Discharge status: Home / Self Care | Payer: Medicare Other | Source: Ambulatory Visit | Attending: Internal Medicine | Admitting: Internal Medicine

## 2012-11-02 ENCOUNTER — Encounter (HOSPITAL_BASED_OUTPATIENT_CLINIC_OR_DEPARTMENT_OTHER): Payer: Medicare Other | Attending: General Surgery

## 2012-11-02 ENCOUNTER — Encounter (HOSPITAL_COMMUNITY): Payer: Medicare Other

## 2012-11-02 DIAGNOSIS — L97309 Non-pressure chronic ulcer of unspecified ankle with unspecified severity: Secondary | ICD-10-CM | POA: Insufficient documentation

## 2012-11-02 DIAGNOSIS — K219 Gastro-esophageal reflux disease without esophagitis: Secondary | ICD-10-CM | POA: Insufficient documentation

## 2012-11-02 DIAGNOSIS — E7800 Pure hypercholesterolemia, unspecified: Secondary | ICD-10-CM | POA: Insufficient documentation

## 2012-11-02 DIAGNOSIS — E1169 Type 2 diabetes mellitus with other specified complication: Secondary | ICD-10-CM | POA: Insufficient documentation

## 2012-11-02 DIAGNOSIS — Z79899 Other long term (current) drug therapy: Secondary | ICD-10-CM | POA: Insufficient documentation

## 2012-11-02 DIAGNOSIS — J8410 Pulmonary fibrosis, unspecified: Secondary | ICD-10-CM | POA: Insufficient documentation

## 2012-11-02 DIAGNOSIS — Z9981 Dependence on supplemental oxygen: Secondary | ICD-10-CM | POA: Insufficient documentation

## 2012-11-02 DIAGNOSIS — M1710 Unilateral primary osteoarthritis, unspecified knee: Secondary | ICD-10-CM | POA: Insufficient documentation

## 2012-11-02 LAB — GLUCOSE, CAPILLARY: Glucose-Capillary: 262 mg/dL — ABNORMAL HIGH (ref 70–99)

## 2012-11-02 NOTE — H&P (Signed)
Donna Watts, Donna Watts                ACCOUNT NO.:  [id number]  MEDICAL RECORD NO.:  [id number]  LOCATION:  FOOT                         FACILITY:  [facility]  PHYSICIAN:  Elesa Hacker, M.D.        DATE OF BIRTH:  02-03-1930  DATE OF ADMISSION:  11/02/2012 DATE OF DISCHARGE:                             HISTORY & PHYSICAL   CHIEF COMPLAINT:  Wounds, both legs.  HISTORY OF PRESENT ILLNESS:  This 79 year old female with diabetes type 2, developed a wound on her left lateral ankle on the left side approximately 1 year ago, thought to be secondary to a bite, a blister formed.  This was drained and then the patient developed an infection and the wound has been persistent.  She has had a wound on her right ankle since February when she underwent a skin biopsy.  We are not sure what the reason for the biopsy was.  Both of these wounds have failed to heal.  PAST MEDICAL HISTORY:  Significant for diabetes, pulmonary fibrosis, which is being treated by Dr. Leonides Schanz, degenerative disease of both knees, oxygen dependency, hypercholesterolemia, and GERD.  SURGICAL HISTORY:  She had an appendectomy in the 1970s.  Cigarettes none.  Alcohol none.  MEDICATIONS:  Ventolin, simvastatin, metformin, aspirin, omeprazole, and prednisone.  ALLERGY:  TRAMADOL CAUSES A RASH AND DIZZINESS.  REVIEW OF SYSTEMS:  As above.  PHYSICAL EXAMINATION:  VITAL SIGNS:  Temperature 98.2, pulse 105, respirations 16, blood pressure 149/64.  Capillary glucose is 262. EYES, EARS, NOSE, THROAT:  Normal. CHEST:  Has rales both sides. HEART:  Regular rhythm. ABDOMEN:  Not examined. EXTREMITIES:  Examination of the lower extremities reveals peripheral pulses are palpable.  ABI is 1.18 on the right and 1.06 on the left.  On the right ankle, there is a 1.0 x 1.0 very superficial wound.  On the left ankle, there is a 0.3 x 0.3 somewhat deeper wound approximately 0.2 cm deep.  IMPRESSION:  Diabetic ulcers bilaterally Wagner III.  We  will start treatment with Santyl.  Peripheral vascular disease seems minimal.     Elesa Hacker, M.D.     RA/MEDQ  D:  11/02/2012  T:  11/02/2012  Job:  [id number]

## 2012-11-04 ENCOUNTER — Encounter (HOSPITAL_COMMUNITY)
Admission: RE | Admit: 2012-11-04 | Discharge: 2012-11-04 | Disposition: A | Discharge status: Home / Self Care | Payer: Medicare Other | Source: Ambulatory Visit | Attending: Internal Medicine | Admitting: Internal Medicine

## 2012-11-09 ENCOUNTER — Encounter (HOSPITAL_COMMUNITY): Payer: Medicare Other

## 2012-11-09 ENCOUNTER — Telehealth (HOSPITAL_COMMUNITY): Payer: Self-pay | Admitting: Family Medicine

## 2012-11-11 ENCOUNTER — Encounter (HOSPITAL_COMMUNITY): Payer: Medicare Other

## 2012-11-14 ENCOUNTER — Telehealth: Payer: Self-pay | Admitting: Internal Medicine

## 2012-11-14 NOTE — Telephone Encounter (Signed)
xxxxxxxxxxxx

## 2012-11-16 ENCOUNTER — Encounter (HOSPITAL_COMMUNITY): Payer: Medicare Other

## 2012-11-18 ENCOUNTER — Encounter (HOSPITAL_COMMUNITY): Payer: Medicare Other

## 2012-11-19 ENCOUNTER — Ambulatory Visit
Admission: RE | Admit: 2012-11-19 | Discharge: 2012-11-19 | Disposition: A | Discharge status: Home / Self Care | Payer: Medicare Other | Source: Ambulatory Visit | Attending: Family Medicine | Admitting: Family Medicine

## 2012-11-19 ENCOUNTER — Other Ambulatory Visit: Payer: Self-pay | Admitting: Family Medicine

## 2012-11-19 DIAGNOSIS — R42 Dizziness and giddiness: Secondary | ICD-10-CM

## 2012-11-23 ENCOUNTER — Encounter (HOSPITAL_COMMUNITY): Payer: Medicare Other

## 2012-11-25 ENCOUNTER — Encounter (HOSPITAL_COMMUNITY)
Admission: RE | Admit: 2012-11-25 | Discharge: 2012-11-25 | Disposition: A | Discharge status: Home / Self Care | Payer: Medicare Other | Source: Ambulatory Visit | Attending: Internal Medicine | Admitting: Internal Medicine

## 2012-11-25 NOTE — Progress Notes (Signed)
First day back to exercise since she had surgery for skin lesion at the corner of her right eye.  She walked track and modified exercise today since her first day to return.  Leverne Humbles RN

## 2012-11-30 ENCOUNTER — Encounter (HOSPITAL_COMMUNITY)
Admission: RE | Admit: 2012-11-30 | Discharge: 2012-11-30 | Disposition: A | Discharge status: Home / Self Care | Payer: Medicare Other | Source: Ambulatory Visit | Attending: Internal Medicine | Admitting: Internal Medicine

## 2012-11-30 DIAGNOSIS — Z5189 Encounter for other specified aftercare: Secondary | ICD-10-CM | POA: Insufficient documentation

## 2012-11-30 DIAGNOSIS — J9610 Chronic respiratory failure, unspecified whether with hypoxia or hypercapnia: Secondary | ICD-10-CM | POA: Insufficient documentation

## 2012-11-30 DIAGNOSIS — J8410 Pulmonary fibrosis, unspecified: Secondary | ICD-10-CM | POA: Insufficient documentation

## 2012-11-30 NOTE — Progress Notes (Signed)
Donna Watts 79 y.o. female Nutrition Note Spoke with pt. Pt wt today 120.6 lb (54.8 kg), which is up 5.1 lb since admission. Pt reports her UBW is 120-125 lb.  Making healthy food choices the majority of the time.  Pt's Rate Your Plate results reviewed with pt.  Pt expressed understanding.  Pt avoids salty food; does not use canned/ convenience food.  Pt does not add salt to food.  The role of sodium in lung disease reviewed with pt.  Pt is diabetic.  Pt checks CBG's 1-2  times a week.  Pt reports CBG's consistently less than 130 mg/dL even while taking prednisone. Pt is currently on a prednisone taper for "1 more week." Pt expressed understanding of the information reviewed. Nutrition Diagnosis   Food-and nutrition-related knowledge deficit related to lack of exposure to information as related to diagnosis of pulmonary disease Nutrition Rx/Est. Daily Nutrition Needs for: ? wt  maintenance 1650-1900 Kcal  85-100 gm protein   1500 mg or less sodium     175-250 gm CHO Nutrition Intervention   Pt's individual nutrition plan and goals reviewed with pt.   Benefits of adopting healthy eating habits discussed when pt's Rate Your Plate reviewed.   Pt to attend the Nutrition and Lung Disease class   Continual client-centered nutrition education by RD, as part of interdisciplinary care. Goal(s) 1. Identify food quantities necessary to maintain wt around a goal wt of 54.5-56.8 kg (120-125 lb) at graduation from pulmonary rehab.  Monitor and Evaluate progress toward nutrition goal with team.   Derek Mound, M.Ed, RD, LDN, CDE 11/30/2012 2:42 PM

## 2012-12-02 ENCOUNTER — Encounter (HOSPITAL_COMMUNITY)
Admission: RE | Admit: 2012-12-02 | Discharge: 2012-12-02 | Disposition: A | Discharge status: Home / Self Care | Payer: Medicare Other | Source: Ambulatory Visit | Attending: Internal Medicine | Admitting: Internal Medicine

## 2012-12-02 ENCOUNTER — Encounter (HOSPITAL_COMMUNITY): Payer: Self-pay

## 2012-12-02 NOTE — Progress Notes (Signed)
I have conducted a Home Exercise Program with Donna Watts.  Patient states that

## 2012-12-02 NOTE — Progress Notes (Unsigned)
Subjective:      Patient ID: Donna Watts is a 79 y.o. female.  Chief Complaint: HPI {Common ambulatory SmartLinks:19316} ROS    Objective:    Physical Exam  Lab Review:  {Recent SQZY:34621::"VIF applicable"}    Assessment:     No diagnosis found.   Plan:     ***   I have completed a Home Exercise Program with Donna Watts.  She states that she is already walking 1 mile a day with her daughter-in-law.  Patient was educated on how to progress her exercise and doing in safely.  Will f/u with patient in a few weeks to see how HEP is going and make any changes if necessary.

## 2012-12-07 ENCOUNTER — Encounter (HOSPITAL_COMMUNITY)
Admission: RE | Admit: 2012-12-07 | Discharge: 2012-12-07 | Disposition: A | Discharge status: Home / Self Care | Payer: Medicare Other | Source: Ambulatory Visit | Attending: Internal Medicine | Admitting: Internal Medicine

## 2012-12-07 ENCOUNTER — Encounter (HOSPITAL_BASED_OUTPATIENT_CLINIC_OR_DEPARTMENT_OTHER): Payer: Medicare Other | Attending: General Surgery

## 2012-12-09 ENCOUNTER — Encounter (HOSPITAL_COMMUNITY)
Admission: RE | Admit: 2012-12-09 | Discharge: 2012-12-09 | Disposition: A | Discharge status: Home / Self Care | Payer: Medicare Other | Source: Ambulatory Visit | Attending: Internal Medicine | Admitting: Internal Medicine

## 2012-12-14 ENCOUNTER — Encounter (HOSPITAL_COMMUNITY)
Admission: RE | Admit: 2012-12-14 | Discharge: 2012-12-14 | Disposition: A | Discharge status: Home / Self Care | Payer: Medicare Other | Source: Ambulatory Visit | Attending: Internal Medicine | Admitting: Internal Medicine

## 2012-12-16 ENCOUNTER — Encounter (HOSPITAL_COMMUNITY)
Admission: RE | Admit: 2012-12-16 | Discharge: 2012-12-16 | Disposition: A | Discharge status: Home / Self Care | Payer: Medicare Other | Source: Ambulatory Visit | Attending: Internal Medicine | Admitting: Internal Medicine

## 2012-12-16 ENCOUNTER — Other Ambulatory Visit: Payer: Self-pay | Admitting: *Deleted

## 2012-12-16 MED ORDER — PREDNISONE 10 MG PO TABS: ORAL_TABLET | ORAL | Status: DC

## 2012-12-21 ENCOUNTER — Encounter (HOSPITAL_COMMUNITY)
Admission: RE | Admit: 2012-12-21 | Discharge: 2012-12-21 | Disposition: A | Discharge status: Home / Self Care | Payer: Medicare Other | Source: Ambulatory Visit | Attending: Internal Medicine | Admitting: Internal Medicine

## 2012-12-23 ENCOUNTER — Encounter (HOSPITAL_COMMUNITY): Payer: Medicare Other

## 2012-12-28 ENCOUNTER — Encounter (HOSPITAL_COMMUNITY)
Admission: RE | Admit: 2012-12-28 | Discharge: 2012-12-28 | Disposition: A | Discharge status: Home / Self Care | Payer: Medicare Other | Source: Ambulatory Visit | Attending: Internal Medicine | Admitting: Internal Medicine

## 2012-12-28 DIAGNOSIS — J9610 Chronic respiratory failure, unspecified whether with hypoxia or hypercapnia: Secondary | ICD-10-CM | POA: Insufficient documentation

## 2012-12-28 DIAGNOSIS — J8410 Pulmonary fibrosis, unspecified: Secondary | ICD-10-CM | POA: Insufficient documentation

## 2012-12-28 DIAGNOSIS — Z5189 Encounter for other specified aftercare: Secondary | ICD-10-CM | POA: Insufficient documentation

## 2012-12-30 ENCOUNTER — Encounter (HOSPITAL_COMMUNITY)
Admission: RE | Admit: 2012-12-30 | Discharge: 2012-12-30 | Disposition: A | Discharge status: Home / Self Care | Payer: Medicare Other | Source: Ambulatory Visit | Attending: Internal Medicine | Admitting: Internal Medicine

## 2013-01-04 ENCOUNTER — Encounter (HOSPITAL_COMMUNITY)
Admission: RE | Admit: 2013-01-04 | Discharge: 2013-01-04 | Disposition: A | Discharge status: Home / Self Care | Payer: Medicare Other | Source: Ambulatory Visit | Attending: Internal Medicine | Admitting: Internal Medicine

## 2013-01-06 ENCOUNTER — Encounter (HOSPITAL_COMMUNITY)
Admission: RE | Admit: 2013-01-06 | Discharge: 2013-01-06 | Disposition: A | Discharge status: Home / Self Care | Payer: Medicare Other | Source: Ambulatory Visit | Attending: Internal Medicine | Admitting: Internal Medicine

## 2013-01-11 ENCOUNTER — Encounter (HOSPITAL_COMMUNITY)
Admission: RE | Admit: 2013-01-11 | Discharge: 2013-01-11 | Disposition: A | Discharge status: Home / Self Care | Payer: Medicare Other | Source: Ambulatory Visit | Attending: Internal Medicine | Admitting: Internal Medicine

## 2013-01-13 ENCOUNTER — Encounter (HOSPITAL_COMMUNITY)
Admission: RE | Admit: 2013-01-13 | Discharge: 2013-01-13 | Disposition: A | Discharge status: Home / Self Care | Payer: Medicare Other | Source: Ambulatory Visit | Attending: Internal Medicine | Admitting: Internal Medicine

## 2013-01-18 ENCOUNTER — Encounter (HOSPITAL_COMMUNITY)
Admission: RE | Admit: 2013-01-18 | Discharge: 2013-01-18 | Disposition: A | Discharge status: Home / Self Care | Payer: Medicare Other | Source: Ambulatory Visit | Attending: Internal Medicine | Admitting: Internal Medicine

## 2013-01-20 ENCOUNTER — Encounter (HOSPITAL_COMMUNITY)
Admission: RE | Admit: 2013-01-20 | Discharge: 2013-01-20 | Disposition: A | Discharge status: Home / Self Care | Payer: Medicare Other | Source: Ambulatory Visit | Attending: Internal Medicine | Admitting: Internal Medicine

## 2013-01-25 ENCOUNTER — Encounter (HOSPITAL_COMMUNITY)
Admission: RE | Admit: 2013-01-25 | Discharge: 2013-01-25 | Disposition: A | Discharge status: Home / Self Care | Payer: Medicare Other | Source: Ambulatory Visit | Attending: Internal Medicine | Admitting: Internal Medicine

## 2013-01-27 ENCOUNTER — Encounter (HOSPITAL_COMMUNITY)
Admission: RE | Admit: 2013-01-27 | Discharge: 2013-01-27 | Disposition: A | Discharge status: Home / Self Care | Payer: Medicare Other | Source: Ambulatory Visit | Attending: Internal Medicine | Admitting: Internal Medicine

## 2013-01-27 DIAGNOSIS — Z5189 Encounter for other specified aftercare: Secondary | ICD-10-CM | POA: Insufficient documentation

## 2013-01-27 DIAGNOSIS — J8410 Pulmonary fibrosis, unspecified: Secondary | ICD-10-CM | POA: Insufficient documentation

## 2013-01-27 DIAGNOSIS — J9610 Chronic respiratory failure, unspecified whether with hypoxia or hypercapnia: Secondary | ICD-10-CM | POA: Insufficient documentation

## 2013-02-01 ENCOUNTER — Encounter (HOSPITAL_COMMUNITY)
Admission: RE | Admit: 2013-02-01 | Discharge: 2013-02-01 | Disposition: A | Discharge status: Home / Self Care | Payer: Medicare Other | Source: Ambulatory Visit | Attending: Internal Medicine | Admitting: Internal Medicine

## 2013-02-15 ENCOUNTER — Encounter (HOSPITAL_COMMUNITY)
Admission: RE | Admit: 2013-02-15 | Discharge: 2013-02-15 | Disposition: A | Discharge status: Home / Self Care | Payer: Medicare Other | Source: Ambulatory Visit | Attending: Internal Medicine | Admitting: Internal Medicine

## 2013-02-17 ENCOUNTER — Encounter (HOSPITAL_COMMUNITY)
Admission: RE | Admit: 2013-02-17 | Discharge: 2013-02-17 | Disposition: A | Discharge status: Home / Self Care | Payer: Medicare Other | Source: Ambulatory Visit | Attending: Internal Medicine | Admitting: Internal Medicine

## 2013-02-22 ENCOUNTER — Encounter (HOSPITAL_COMMUNITY)
Admission: RE | Admit: 2013-02-22 | Discharge: 2013-02-22 | Disposition: A | Discharge status: Home / Self Care | Payer: Medicare Other | Source: Ambulatory Visit | Attending: Internal Medicine | Admitting: Internal Medicine

## 2013-02-24 ENCOUNTER — Encounter (HOSPITAL_COMMUNITY)
Admission: RE | Admit: 2013-02-24 | Discharge: 2013-02-24 | Disposition: A | Discharge status: Home / Self Care | Payer: Medicare Other | Source: Ambulatory Visit | Attending: Internal Medicine | Admitting: Internal Medicine

## 2013-03-01 ENCOUNTER — Encounter (HOSPITAL_COMMUNITY)
Admission: RE | Admit: 2013-03-01 | Discharge: 2013-03-01 | Disposition: A | Discharge status: Home / Self Care | Payer: Medicare Other | Source: Ambulatory Visit | Attending: Internal Medicine | Admitting: Internal Medicine

## 2013-03-01 DIAGNOSIS — J9610 Chronic respiratory failure, unspecified whether with hypoxia or hypercapnia: Secondary | ICD-10-CM | POA: Insufficient documentation

## 2013-03-01 DIAGNOSIS — Z5189 Encounter for other specified aftercare: Secondary | ICD-10-CM | POA: Insufficient documentation

## 2013-03-01 DIAGNOSIS — J8410 Pulmonary fibrosis, unspecified: Secondary | ICD-10-CM | POA: Insufficient documentation

## 2013-03-03 ENCOUNTER — Other Ambulatory Visit: Payer: Self-pay

## 2013-03-03 ENCOUNTER — Encounter (HOSPITAL_COMMUNITY)
Admission: RE | Admit: 2013-03-03 | Discharge: 2013-03-03 | Disposition: A | Discharge status: Home / Self Care | Payer: Medicare Other | Source: Ambulatory Visit | Attending: Internal Medicine | Admitting: Internal Medicine

## 2013-03-08 ENCOUNTER — Encounter (HOSPITAL_COMMUNITY)
Admission: RE | Admit: 2013-03-08 | Discharge: 2013-03-08 | Disposition: A | Discharge status: Home / Self Care | Payer: Medicare Other | Source: Ambulatory Visit | Attending: Internal Medicine | Admitting: Internal Medicine

## 2013-03-10 ENCOUNTER — Encounter (HOSPITAL_COMMUNITY): Payer: Medicare Other

## 2013-03-15 ENCOUNTER — Encounter (HOSPITAL_COMMUNITY)
Admission: RE | Admit: 2013-03-15 | Discharge: 2013-03-15 | Disposition: A | Discharge status: Home / Self Care | Payer: Medicare Other | Source: Ambulatory Visit | Attending: Internal Medicine | Admitting: Internal Medicine

## 2013-03-17 ENCOUNTER — Encounter (HOSPITAL_COMMUNITY)
Admission: RE | Admit: 2013-03-17 | Discharge: 2013-03-17 | Disposition: A | Discharge status: Home / Self Care | Payer: Medicare Other | Source: Ambulatory Visit | Attending: Internal Medicine | Admitting: Internal Medicine

## 2013-03-21 ENCOUNTER — Encounter (HOSPITAL_COMMUNITY): Payer: Self-pay | Admitting: Emergency Medicine

## 2013-03-21 ENCOUNTER — Emergency Department (HOSPITAL_COMMUNITY): Payer: Medicare Other

## 2013-03-21 ENCOUNTER — Emergency Department (HOSPITAL_COMMUNITY)
Admission: EM | Admit: 2013-03-21 | Discharge: 2013-03-21 | Disposition: A | Discharge status: Home / Self Care | Payer: Medicare Other | Attending: Emergency Medicine | Admitting: Emergency Medicine

## 2013-03-21 DIAGNOSIS — Z9981 Dependence on supplemental oxygen: Secondary | ICD-10-CM | POA: Insufficient documentation

## 2013-03-21 DIAGNOSIS — K529 Noninfective gastroenteritis and colitis, unspecified: Secondary | ICD-10-CM

## 2013-03-21 DIAGNOSIS — IMO0002 Reserved for concepts with insufficient information to code with codable children: Secondary | ICD-10-CM | POA: Insufficient documentation

## 2013-03-21 DIAGNOSIS — E119 Type 2 diabetes mellitus without complications: Secondary | ICD-10-CM | POA: Insufficient documentation

## 2013-03-21 DIAGNOSIS — Z792 Long term (current) use of antibiotics: Secondary | ICD-10-CM | POA: Insufficient documentation

## 2013-03-21 DIAGNOSIS — Z7982 Long term (current) use of aspirin: Secondary | ICD-10-CM | POA: Insufficient documentation

## 2013-03-21 DIAGNOSIS — Z79899 Other long term (current) drug therapy: Secondary | ICD-10-CM | POA: Insufficient documentation

## 2013-03-21 DIAGNOSIS — N390 Urinary tract infection, site not specified: Secondary | ICD-10-CM | POA: Insufficient documentation

## 2013-03-21 DIAGNOSIS — K5900 Constipation, unspecified: Secondary | ICD-10-CM | POA: Insufficient documentation

## 2013-03-21 DIAGNOSIS — K5289 Other specified noninfective gastroenteritis and colitis: Secondary | ICD-10-CM | POA: Insufficient documentation

## 2013-03-21 DIAGNOSIS — M069 Rheumatoid arthritis, unspecified: Secondary | ICD-10-CM | POA: Insufficient documentation

## 2013-03-21 DIAGNOSIS — E785 Hyperlipidemia, unspecified: Secondary | ICD-10-CM | POA: Insufficient documentation

## 2013-03-21 HISTORY — DX: Rheumatoid arthritis, unspecified: M06.9

## 2013-03-21 HISTORY — DX: Gastro-esophageal reflux disease without esophagitis: K21.9

## 2013-03-21 LAB — CBC WITH DIFFERENTIAL/PLATELET
HCT: 34.7 % — ABNORMAL LOW (ref 36.0–46.0)
Hemoglobin: 11 g/dL — ABNORMAL LOW (ref 12.0–15.0)
Lymphocytes Relative: 9 % — ABNORMAL LOW (ref 12–46)
Lymphs Abs: 0.9 10*3/uL (ref 0.7–4.0)
Monocytes Absolute: 0.4 10*3/uL (ref 0.1–1.0)
Monocytes Relative: 4 % (ref 3–12)
Neutro Abs: 8.4 10*3/uL — ABNORMAL HIGH (ref 1.7–7.7)
RBC: 3.9 MIL/uL (ref 3.87–5.11)
WBC: 9.8 10*3/uL (ref 4.0–10.5)

## 2013-03-21 LAB — COMPREHENSIVE METABOLIC PANEL
AST: 18 U/L (ref 0–37)
Alkaline Phosphatase: 71 U/L (ref 39–117)
BUN: 18 mg/dL (ref 6–23)
CO2: 30 mEq/L (ref 19–32)
Chloride: 98 mEq/L (ref 96–112)
Creatinine, Ser: 0.58 mg/dL (ref 0.50–1.10)
GFR calc non Af Amer: 84 mL/min — ABNORMAL LOW (ref >90)
Total Bilirubin: 0.3 mg/dL (ref 0.3–1.2)

## 2013-03-21 LAB — URINALYSIS, ROUTINE W REFLEX MICROSCOPIC
Hgb urine dipstick: NEGATIVE
Ketones, ur: NEGATIVE mg/dL
Protein, ur: NEGATIVE mg/dL
Urobilinogen, UA: 0.2 mg/dL (ref 0.0–1.0)

## 2013-03-21 LAB — LIPASE, BLOOD: Lipase: 27 U/L (ref 11–59)

## 2013-03-21 MED ORDER — HYDROCODONE-ACETAMINOPHEN 5-325 MG PO TABS: 0.5000 | ORAL_TABLET | Freq: Three times a day (TID) | ORAL | Status: DC

## 2013-03-21 MED ORDER — MORPHINE SULFATE 4 MG/ML IJ SOLN
4.0000 mg | Freq: Once | INTRAMUSCULAR | Status: DC
  Filled 2013-03-21: qty 1

## 2013-03-21 MED ORDER — IOHEXOL 300 MG/ML  SOLN
100.0000 mL | Freq: Once | INTRAMUSCULAR | Status: AC | PRN
  Administered 2013-03-21: 100 mL via INTRAVENOUS

## 2013-03-21 NOTE — ED Notes (Signed)
Pt transported to restroom with wheelchair.

## 2013-03-21 NOTE — ED Provider Notes (Signed)
  Physical Exam  BP 123/49  Pulse 78  Temp(Src) 98 F (36.7 C)  Resp 16  SpO2 95%  Physical Exam  ED Course  Procedures I explained to the patient and her son.  The findings of the CT scan.  They were advised to followup with her primary care Dr. will also give treatment for constipation.  The patient and the son were comfortable with the plan.  All questions were answered.     Brent General, PA-C 03/21/13 1738

## 2013-03-21 NOTE — ED Notes (Signed)
Pt c/o severe abdominal pain on right side, started last week, pain moved towards the back and it is in her right lower quadrant and back. Pt went to her primary doctor Friday and was treated for UTI. Is in the middle of taking antibiotic. Urinary symptoms are resolving but pain is not. Pain is positional with how pt is laying in bed. Nontender to palpation, pain gets a "little bit worse" when pt takes a deep breath. Denies N/V/D. No problems urinating. Denies CP, SOB, Dizziness.

## 2013-03-21 NOTE — ED Provider Notes (Signed)
CSN: [id number]     Arrival date & time 03/21/13  1155 History   First MD Initiated Contact with Patient 03/21/13 1356     Chief Complaint  Patient presents with  . Abdominal Pain   (Consider location/radiation/quality/duration/timing/severity/associated sxs/prior Treatment) HPI  79 year old female with history of diabetes, GERD, hyperlipidemia presents for evaluation of abdominal pain. History obtained through patient and through son who is at bedside. Patient reports of gradual onset persistent right abdominal pain radiates to back ongoing for the past 6 days. Pain initially started after she was pushing a cart and carrying her home O2 oxygen to the store. Describe pain as a sharp achy sensation, improves with rest and worsened with movement. She has been taken pain medication at home with minimal relief. She has been constipated however after taking milk of magnesia last night she was able to have a productive bowel movement. She was seen by her primary care Dr. 3 days ago for the same complaint and was found to have a urinary tract infection. She was placed on Cipro which she is currently taking with minimal relief. Still endorsed 9/10 pain to the abdomen and back. No compressive fever, chills, headache, chest pain, shortness of breath, productive cough, hemoptysis, nausea, vomiting, diarrhea, dysuria, hematuria, hematochezia, or melena. Denies lightheadedness or dizziness, numbness or weakness. History of pulmonary fibrosis use home O2. Still has an intact gallbladder.  Past Medical History  Diagnosis Date  . Other and unspecified hyperlipidemia   . Esophageal reflux   . Pulmonary fibrosis   . Diabetes mellitus without complication   . Acid reflux   . Rheumatoid arthritis    Past Surgical History  Procedure Laterality Date  . Appendectomy     Family History  Problem Relation Age of Onset  . Heart disease Brother    History  Substance Use Topics  . Smoking status: Never Smoker    . Smokeless tobacco: Never Used  . Alcohol Use: No   OB History   Grav Para Term Preterm Abortions TAB SAB Ect Mult Living                 Review of Systems  All other systems reviewed and are negative.    Allergies  Tramadol  Home Medications   Current Outpatient Rx  Name  Route  Sig  Dispense  Refill  . albuterol (VENTOLIN HFA) 108 (90 BASE) MCG/ACT inhaler   Inhalation   Inhale 2 puffs into the lungs every 6 (six) hours as needed.   1 Inhaler   6   . aspirin 81 MG tablet   Oral   Take 81 mg by mouth daily.           . budesonide-formoterol (SYMBICORT) 160-4.5 MCG/ACT inhaler      Take 2 puffs first thing in am and then another 2 puffs about 12 hours later.   1 Inhaler   12   . Calcium Citrate-Vitamin D (RA CALCIUM CITRATE PLUS VIT D PO)   Oral   Take 1 capsule by mouth daily.           . cephALEXin (KEFLEX) 500 MG capsule   Oral   Take 1 capsule by mouth 4 (four) times daily.         Marland Kitchen dextromethorphan-guaiFENesin (MUCINEX DM) 30-600 MG per 12 hr tablet   Oral   Take 1 tablet by mouth every 12 (twelve) hours as needed.          . metFORMIN (GLUCOPHAGE-XR) 500  MG 24 hr tablet   Oral   Take 1 tablet by mouth daily.         . Multiple Vitamins-Minerals (CENTRUM SILVER PO)   Oral   Take 1 tablet by mouth daily.           . predniSONE (DELTASONE) 10 MG tablet      Take 2 tabs daily until symptoms improve, then take 1 tablet daily until seen my Rheumatology.   60 tablet   0   . simvastatin (ZOCOR) 20 MG tablet   Oral   Take 20 mg by mouth at bedtime.           Marland Kitchen Spacer/Aero-Holding Chambers (BREATHERITE COLL SPACER ADULT) MISC      Use with albuterol   1 each   0    BP 146/77  Pulse 82  Temp(Src) 98 F (36.7 C)  Resp 16  SpO2 100% Physical Exam  Nursing note and vitals reviewed. Constitutional: She appears well-developed and well-nourished. No distress.  HENT:  Head: Atraumatic.  Eyes: Conjunctivae are normal.  Neck:  Neck supple.  Cardiovascular: Normal rate and regular rhythm.   Pulmonary/Chest:  Moderate rales heard throughout all lung fields.  Abdominal: Soft. Bowel sounds are normal. There is tenderness (bowel right upper quadrant tenderness without guarding or rebound tenderness, no peritonitis.). There is no rebound and no guarding.  Genitourinary:  No CVA tenderness  Musculoskeletal: She exhibits no edema.  Neurological: She is alert.  Skin: No rash noted.  Psychiatric: She has a normal mood and affect.    ED Course  Procedures (including critical care time)  2:26 PM Patient with right quadrant abdominal pain. No other symptoms to suggest biliary disease. Recently diagnosed with UTI, currently on Cipro however no improvement. Currently afebrile with stable normal vital signs. Workup initiated, will obtain abdominal CT. Care discussed with attending.  4:02 PM UA shows no evidence of UTI.  Labs are reassuring.  Electrolytes are reassuring.  Acute abdominal xray shows large stool burden to suggest constipation.  However, there's several nondistended air-fluid levels noted in the R colon, inflammatory condition cannot be excluded.  Therefore, will check CT of abdomen and pelvis for further evaluation.  Care discussed with oncoming PA who will continue further care.    Labs Review Labs Reviewed  CBC WITH DIFFERENTIAL - Abnormal; Notable for the following:    Hemoglobin 11.0 (*)    HCT 34.7 (*)    RDW 16.0 (*)    Neutrophils Relative % 86 (*)    Neutro Abs 8.4 (*)    Lymphocytes Relative 9 (*)    All other components within normal limits  COMPREHENSIVE METABOLIC PANEL - Abnormal; Notable for the following:    Glucose, Bld 161 (*)    GFR calc non Af Amer 84 (*)    All other components within normal limits  URINALYSIS, ROUTINE W REFLEX MICROSCOPIC - Abnormal; Notable for the following:    Leukocytes, UA TRACE (*)    All other components within normal limits  LIPASE, BLOOD  URINE  MICROSCOPIC-ADD ON   Imaging Review Ct Abdomen Pelvis W Contrast  03/21/2013   CLINICAL DATA:  Constipation and abdominal pain.  EXAM: CT ABDOMEN AND PELVIS WITH CONTRAST  TECHNIQUE: Multidetector CT imaging of the abdomen and pelvis was performed using the standard protocol following bolus administration of intravenous contrast.  CONTRAST:  171m OMNIPAQUE IOHEXOL 300 MG/ML  SOLN  COMPARISON:  03/21/2013  FINDINGS: Basilar honeycombing and fibrosis with old granulomatous disease  on the right.  0.7 x 0.4 cm hypodense lesion in segment 4 the liver, image 22 series 2, statistically likely to be benign but technically nonspecific. Spleen, adrenal and pancreas unremarkable. No biliary dilatation.  Simple appearing 2.6 x 3.3 right kidney upper pole cyst. The right kidney is oriented with its long axis anterior posterior, with the renal hilum facing superiorly. This is at least partially due to the prominent dextroconvex lumbar scoliosis with rotary component. Left kidney unremarkable.  No hydronephrosis or hydroureter aortoiliac atherosclerotic vascular disease.  Scattered colonic diverticula including the ascending colon observed. There are some air-fluid levels nondilated loops of jejunum. Appendix is not well seen. Uterine and adnexal contours unremarkable. Bladder unremarkable. Numerous vascular calcifications are present all around the distal ureters without definite intra ureteral calcification.  Mildly redundant sigmoid colon with scattered diverticula.  IMPRESSION: 1. Scattered air-fluid levels and proximal loops of small bowel, query proximal enteritis. 2. Pulmonary fibrosis with honeycombing in the lung bases. Old granulomatous disease. 3. Prominent dextroconvex lumbar scoliosis with rotary component. 4. Colonic diverticulosis throughout colon. No findings of active diverticulitis are identified.   Electronically Signed   By: Sherryl Barters M.D.   On: 03/21/2013 16:56   Dg Abd Acute  W/chest  03/21/2013   CLINICAL DATA:  Right upper quadrant pain.  EXAM: ACUTE ABDOMEN SERIES (ABDOMEN 2 VIEW & CHEST 1 VIEW)  COMPARISON:  Chest CT 10/07/2012.  Chest x-ray 08/10/2012.  FINDINGS: Mediastinum and hilar structures are normal. Diffuse prominent interstitial changes are noted consistent with interstitial fibrosis. Similar findings have been present on prior studies and are unchanged. Cardiomegaly, no pulmonary venous congestion or pleural effusion. No pneumothorax.  Soft tissues of the abdomen unremarkable. Splenic artery calcification present. Calcifications in pelvis consistent with phleboliths. Large amount of stool present in the colon. Several nonspecific air-fluid levels noted in what appears to be right colon. Although this is nonspecific a right abdominal inflammatory process cannot be excluded . The colon is nondistended. No bowel distention. No free air appear. If abdominal symptoms remain CT can be obtained . Severe scoliosis and degenerative changes lumbar spine.  IMPRESSION: 1. Pulmonary interstitial fibrosis. 2. Large amount of stool in colon consistent with constipation. 3. Several nondistended air-fluid levels noted in the right colon. Although this is nonspecific a right abdominal inflammatory condition cannot be excluded. If symptoms remains CT of the abdomen and pelvis can be obtained.   Electronically Signed   By: Lutz   On: 03/21/2013 13:22    EKG Interpretation   None       MDM   1. Enteritis   2. Constipation    BP 120/62  Pulse 68  Temp(Src) 98 F (36.7 C)  Resp 16  SpO2 100%  I have reviewed nursing notes and vital signs. I personally reviewed the imaging tests through PACS system  I reviewed available ER/hospitalization records thought the EMR     Domenic Moras, [place] 03/22/13 2998

## 2013-03-21 NOTE — ED Notes (Addendum)
abd pain since last Tuesday  More rt side  Back and front no n/v/  Still has GB but no app placed on cipro for UTI on friday

## 2013-03-22 ENCOUNTER — Encounter (HOSPITAL_COMMUNITY): Payer: Medicare Other

## 2013-03-22 NOTE — ED Provider Notes (Signed)
Medical screening examination/treatment/procedure(s) were performed by non-physician practitioner and as supervising physician I was immediately available for consultation/collaboration.  EKG Interpretation   None        Merryl Hacker, MD 03/22/13 1042

## 2013-03-22 NOTE — ED Provider Notes (Signed)
Medical screening examination/treatment/procedure(s) were conducted as a shared visit with non-physician practitioner(s) and myself.  I personally evaluated the patient during the encounter.  EKG Interpretation   None       69F here with abdominal pain. Recent diagnosis of UTI, placed on Cipro. Persistent R flank pain for past week. No fevers, vomiting, diarrhea. Tolerating PO well. AFVSS here, relaxing comfortably. R flank tenderness to palpation here. No other abdominal pain. No rebound or guarding. Xray with constipation, other suspicious findings, will CT scan.   Osvaldo Shipper, MD 03/22/13 [phone]

## 2013-03-24 ENCOUNTER — Encounter (HOSPITAL_COMMUNITY): Payer: Medicare Other

## 2013-03-29 ENCOUNTER — Encounter (HOSPITAL_COMMUNITY): Payer: Medicare Other

## 2013-03-29 DIAGNOSIS — J8410 Pulmonary fibrosis, unspecified: Secondary | ICD-10-CM | POA: Insufficient documentation

## 2013-03-29 DIAGNOSIS — Z5189 Encounter for other specified aftercare: Secondary | ICD-10-CM | POA: Insufficient documentation

## 2013-03-29 DIAGNOSIS — J9610 Chronic respiratory failure, unspecified whether with hypoxia or hypercapnia: Secondary | ICD-10-CM | POA: Insufficient documentation

## 2013-03-31 ENCOUNTER — Encounter (HOSPITAL_COMMUNITY): Payer: Medicare Other

## 2013-04-05 ENCOUNTER — Encounter (HOSPITAL_COMMUNITY)
Admission: RE | Admit: 2013-04-05 | Discharge: 2013-04-05 | Disposition: A | Discharge status: Home / Self Care | Payer: Self-pay | Source: Ambulatory Visit | Attending: Internal Medicine | Admitting: Internal Medicine

## 2013-04-07 ENCOUNTER — Encounter (HOSPITAL_COMMUNITY)
Admission: RE | Admit: 2013-04-07 | Discharge: 2013-04-07 | Disposition: A | Discharge status: Home / Self Care | Payer: Self-pay | Source: Ambulatory Visit | Attending: Internal Medicine | Admitting: Internal Medicine

## 2013-04-12 ENCOUNTER — Encounter (HOSPITAL_COMMUNITY)
Admission: RE | Admit: 2013-04-12 | Discharge: 2013-04-12 | Disposition: A | Discharge status: Home / Self Care | Payer: Self-pay | Source: Ambulatory Visit | Attending: Internal Medicine | Admitting: Internal Medicine

## 2013-04-14 ENCOUNTER — Encounter (HOSPITAL_COMMUNITY)
Admission: RE | Admit: 2013-04-14 | Discharge: 2013-04-14 | Disposition: A | Discharge status: Home / Self Care | Payer: Self-pay | Source: Ambulatory Visit | Attending: Internal Medicine | Admitting: Internal Medicine

## 2013-04-19 ENCOUNTER — Encounter (HOSPITAL_COMMUNITY): Payer: Medicare Other

## 2013-04-21 ENCOUNTER — Encounter (HOSPITAL_COMMUNITY): Payer: Medicare Other

## 2013-04-22 ENCOUNTER — Telehealth: Payer: Self-pay | Admitting: Internal Medicine

## 2013-04-22 MED ORDER — BUDESONIDE-FORMOTEROL FUMARATE 160-4.5 MCG/ACT IN AERO: INHALATION_SPRAY | RESPIRATORY_TRACT | Status: DC

## 2013-04-22 NOTE — Telephone Encounter (Signed)
Spoke with pt son and rx has been refilled. Nothing further needed

## 2013-04-26 ENCOUNTER — Encounter (HOSPITAL_COMMUNITY)
Admission: RE | Admit: 2013-04-26 | Discharge: 2013-04-26 | Disposition: A | Discharge status: Home / Self Care | Payer: Self-pay | Source: Ambulatory Visit | Attending: Internal Medicine | Admitting: Internal Medicine

## 2013-04-28 ENCOUNTER — Encounter (HOSPITAL_COMMUNITY): Payer: Medicare Other

## 2013-04-28 DIAGNOSIS — J9610 Chronic respiratory failure, unspecified whether with hypoxia or hypercapnia: Secondary | ICD-10-CM | POA: Insufficient documentation

## 2013-04-28 DIAGNOSIS — Z5189 Encounter for other specified aftercare: Secondary | ICD-10-CM | POA: Insufficient documentation

## 2013-04-28 DIAGNOSIS — J8410 Pulmonary fibrosis, unspecified: Secondary | ICD-10-CM | POA: Insufficient documentation

## 2013-05-03 ENCOUNTER — Encounter (HOSPITAL_COMMUNITY)
Admission: RE | Admit: 2013-05-03 | Discharge: 2013-05-03 | Disposition: A | Discharge status: Home / Self Care | Payer: Self-pay | Source: Ambulatory Visit | Attending: Internal Medicine | Admitting: Internal Medicine

## 2013-05-05 ENCOUNTER — Encounter (HOSPITAL_COMMUNITY)
Admission: RE | Admit: 2013-05-05 | Discharge: 2013-05-05 | Disposition: A | Discharge status: Home / Self Care | Payer: Self-pay | Source: Ambulatory Visit | Attending: Internal Medicine | Admitting: Internal Medicine

## 2013-05-10 ENCOUNTER — Ambulatory Visit (INDEPENDENT_AMBULATORY_CARE_PROVIDER_SITE_OTHER): Payer: Medicare Other | Admitting: Internal Medicine

## 2013-05-10 ENCOUNTER — Encounter (HOSPITAL_COMMUNITY)
Admission: RE | Admit: 2013-05-10 | Discharge: 2013-05-10 | Disposition: A | Discharge status: Home / Self Care | Payer: Self-pay | Source: Ambulatory Visit | Attending: Internal Medicine | Admitting: Internal Medicine

## 2013-05-10 ENCOUNTER — Encounter: Payer: Self-pay | Admitting: Internal Medicine

## 2013-05-10 VITALS — BP 114/60 | HR 76 | Temp 98.0°F | Ht <= 58 in | Wt 128.0 lb

## 2013-05-10 DIAGNOSIS — J9610 Chronic respiratory failure, unspecified whether with hypoxia or hypercapnia: Secondary | ICD-10-CM

## 2013-05-10 DIAGNOSIS — J8410 Pulmonary fibrosis, unspecified: Secondary | ICD-10-CM

## 2013-05-10 NOTE — Progress Notes (Signed)
Subjective:     Patient ID: Donna Watts, female   DOB: 04/10/1930 .   MRN: [id number]  Brief patient profile:  Primary Provider/Referring Provider: Dr Mertha Finders   HPI 57 yowf never smoker mother of a [employer] PA with h/o cough onset around 2005 with a cxr showing scarring per Dr Inda Merlin office records c/w PF  October 12, 2008 ov at request of [place] with worse cough winter 2010 and after prednisone seemed better except still cough at hs. mostly dry. 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  rec  Stop fish oil  continue omeprazole but take one 30-60 min before first meal of the day  for cough try delsym 2 tsp every 12   November 09, 2008 cough much better, doe minimal improvement certainly no worse. no sign arthritis or dysphagia.  rec continue omeprazole Take one 30-60 min before first meal of the day  Please schedule a follow-up appointment in 3 months with CXR sooner if breathing or cough worsen  NO MACRODANTIN   September 2010 flew to [place] one month after landed increase cough esp at bedtime better after rx which included European version of symbicort which is dpi. Back to baseline doe, no cough.    09/25/2010 ov/Ramone Gander cc cough and breathing better but using symbicort dpi from [place]. Sleeping ok without nocturnal  or early am exac of resp c/o's.   rec no change in rx.  Work on inhaler technique but use symbicort hfa because we don't have the dpi version here.    12/02/2010 f/u ov/Bracken Moffa cc doe x 20 min s stopping.  No sign cough.  rec Work on inhaler technique:  Follow up due when you return from [place] to [place] in [place] 2 days arrived from [place] where did not feel she had same stamina due to sob with dx flu vs pna and was discharged on 2lpm with exertion outside the house but rarely inside house off 02.  07/04/2011 f/u ov/Altagracia Rone cc doe better on 02.  Still struggling with inhalers despite spacer and thoroughly confused with the instructions she's  received from multiple doctors and fm members "everyone has an opinion on the right way to do things- says she was taught if the spacer didn't honk she was doing it wrong."  Cough ? Better p albuterol mostly dry. No active sinus or reflux symptoms rec Work on inhaler technique:    Symbicort 160 Take 2 puffs first thing in am and then another 2 puffs about 12 hours later.  Only use your albuterol (blue inhaler/ ventolin)  as a rescue medication  For cough use mucinex dm and if still coughing then add the tramadol 50 mg every 4 hours as needed. Late add:   Should continue ppi and h2 hs until next ov   08/15/2011 f/u ov/Cher Franzoni cc breathing some better . No overt hb or sinus complaints rec Ok with me to go [facility] to see if qualifies for any studies but no change in recs  08/29/2011 f/u ov/Tyus Kallam cc much better p rx for uri with abx and prednisone( completed 4/27) with no cough and baseline doe x outdoors walking better on 3lpm np.  rec Wear 02 2lpm with walking outside the house and 3lpm when really exerting Eval at [facility] did not qualify for any studies  08/10/2012 f/u ov/Kosei Rhodes re pf Chief Complaint  Patient presents with  . Follow-up    pt just returned from [place] and was  in the hospital there for pneumonia.    albuterol helps but she really isn't able to inhale more than 5% of the dose due to poor mdi  overall better since admit in [place] now 02 at  2.5 liter and 3-4 liters when exerting but still desats on 3lpm on symbicort and budesonide per [place].   rec Prednisone 10  Mg 2 daily until breathing  Better, then 10 mg daily x 5 days and stop Ok to try spacer but need to return with all medications in hand    09/09/2012 f/u ov/Derrek Puff re PF Chief Complaint  Patient presents with  . Follow-up    Pt c/o having increased SOB with humid weather. Has dry cough off and on during the day.   cough much better on prednisone, slt  worse off it despite symbiocort dpi 320 one bid, no need for  saba Using 02 4lpm walking outside, sitting still on 2lpm Able to walk up to 0.8 miles on am of ov and sats ok on 4lpm, self titrating 02 says sats were fine on 4lpm walking rec Keep appt for the [facility] and return here as needed symbicort hfa is Take 2 puffs first thing in am and then another 2 puffs about 12 hours later through spacer  Only use your albuterol if needed   10/06/2012 ov/ Cobin Cadavid on pred 5 mg per day and 3lpm at rest and 6 with exertion Chief Complaint  Patient presents with  . Follow-up    Pt states her breathing has improved some since her last visit here. She is planning on starting pulmonary rehab in 1 wk. No new co's today.   since last ov tentative dx of ? RA > for rheumatology eval.  Not needing saba daytime rec Ok to adjust the 02 up or down for a target of over 90% sat but the lowest rate of flow you can tolerate is the best flow and least likely to bother your nose  Pulmonary follow up at this point can be as needed      05/10/2013 f/u ov/Arrington Bencomo re: 02 dep RF on 7 mg prednisone/d and immuran x 3 months  Chief Complaint  Patient presents with  . Follow-up    Pt states her breathing is doing well. She enjoys attending pulmonary rehab. No new co's today.  Not feeling the need for symbiocort as much and never saba Arthritis much better  Home unit 5 lpm at hs up to 8 with exercise though rehab only needs  6   No obvious daytime variabilty or assoc  cough or cp or chest tightness, subjective wheeze overt sinus or hb symptoms. No unusual exp hx or h/o childhood pna/ asthma or premature birth to her knowledge.    Sleeping ok without nocturnal  or early am exacerbation  of respiratory  c/o's or need for noct saba. Also denies any obvious fluctuation of symptoms with weather or environmental changes or other aggravating or alleviating factors except as outlined above   Current Medications, Allergies, Past Medical History, Past Surgical History, Family History, and  Social History were reviewed in [employer] record.  ROS  The following are not active complaints unless bolded sore throat, dysphagia, dental problems, itching, sneezing,  nasal congestion or excess/ purulent secretions, ear ache,   fever, chills, sweats, unintended wt loss, pleuritic or exertional cp, hemoptysis,  orthopnea pnd or leg swelling, presyncope, palpitations, heartburn, abdominal pain, anorexia, nausea, vomiting, diarrhea  or change in bowel  or urinary habits, change in stools or urine, dysuria,hematuria,  rash, arthralgias better, visual complaints, headache, numbness weakness or ataxia or problems with walking or coordination,  change in mood/affect or memory.               Past Medical History:  Pulmonary Fibrosis..................................................Marland KitchenWert  - PFT's November 09, 2008 VC 2.03 ( 92%)  DLC0 52%  - PFT's October 25, 2009 VC 2.0 (92%)    DLC0 58%  -PFT's  09/25/10            VC 1.88 (86%)  DLCO 54 Hyperlipidemia  GERD  - ? rx to pepcid > try off Sep 07, 2009 resolved Pneumovax Jan 2013           Objective:   Physical Exam   amb elderly wf speaks slt broken English accompanied by son   wt 140 November 09, 2008 > 141 May 24, 2009 >>144 July 26, 2009> 140 08/08/2010 > 136 08/27/2010 > 134 09/25/2010 > 131 12/02/2010  > 07/04/2011 126 > 08/15/2011  124 > 124 08/29/2011 > 08/10/2012  111 > 09/09/2012 > 10/06/2012  113 > 05/10/2013  128  HEENT: nl dentition, turbinates, and orophanx. Nl external ear canals without cough reflex  NECK : without JVD/Nodes/TM/ nl carotid upstrokes bilaterally  LUNGS: no acc muscle use, classic dry crackles on insp   bilaterally with  no cough on insp  CV: RRR no s3 or murmur or increase in P2, no edema  ABD: soft and nontender with nl excursion in the supine position. No bruits or organomegaly, bowel sounds nl  MS: warm without deformities, calf tenderness, cyanosis or clubbing - no RA deformities    CXR  08/10/2012  : Diffuse right greater than left pulmonary fibrosis without acute cardiopulmonary disease.  ESR 85 08/10/12     Assessment:

## 2013-05-10 NOTE — Patient Instructions (Addendum)
Ok to adjust the 02 up or down for a target of over 90% sat but the lowest rate of flow you can tolerate is the best flow and least likely to bother your nose   Pulmonary follow up at this point can be as needed

## 2013-05-12 ENCOUNTER — Encounter (HOSPITAL_COMMUNITY)
Admission: RE | Admit: 2013-05-12 | Discharge: 2013-05-12 | Disposition: A | Discharge status: Home / Self Care | Payer: Self-pay | Source: Ambulatory Visit | Attending: Internal Medicine | Admitting: Internal Medicine

## 2013-05-12 NOTE — Assessment & Plan Note (Signed)
-  07/04/2011   Walked RA x one lap @ 185 stopped due to  sats 87% at nl pace > 2lpm - 08/29/2011    Walked RA x one lap @ 185 stopped due to  desat to 88% better on 2lpm, eliminated on 3lpm  rx 3  lpm and up to 6lpm with exertion (self monitors)

## 2013-05-12 NOTE — Assessment & Plan Note (Signed)
-  PFT's November 09, 2008 VC 2.03 ( 92%) DLC0 52%  - PFT's October 25, 2009 VC 2.0 (92%)   DLC0 58%  -PFT's  09/25/10            VC1.88 (86%)  DLCO 54 - Ex desat documented 11/09/08 x 1 lap > 84% p 2 laps October 25, 2009 > 84% p one lap 08/27/2010     > 87 p 2 laps RA 09/25/2010 > 83% p 2 laps at a very rapid pace 12/02/10 rec pace or wear 02, wants to try pacing - 07/04/2011   Walked RA x one lap @ 185 stopped due to  sats 87% at nl pace > 2lpm - 08/29/2011   Walked RA x one lap @ 185 stopped due to  desat to 88, improved but not eliminated on 2lpm so rec use 3lpm with ex - 08/10/12 ESR 85 >   Prednisone maint rx - around 12/2012 started Immuran per [facility]   Clearly benefits from treatment for underlying collagen vasc dz with much lower dose of prednisone and maint adequate activity level albeit still requiring high flow 02 to do it.  No change in recs

## 2013-05-17 ENCOUNTER — Encounter (HOSPITAL_COMMUNITY)
Admission: RE | Admit: 2013-05-17 | Discharge: 2013-05-17 | Disposition: A | Discharge status: Home / Self Care | Payer: Self-pay | Source: Ambulatory Visit | Attending: Internal Medicine | Admitting: Internal Medicine

## 2013-05-17 ENCOUNTER — Telehealth: Payer: Self-pay | Admitting: Internal Medicine

## 2013-05-17 NOTE — Telephone Encounter (Signed)
Dr. Melvyn Novas please advise if okay to MR? thanks

## 2013-05-17 NOTE — Telephone Encounter (Signed)
Please advise if ok to switch to MR. Thanks. Blair Bing, CMA

## 2013-05-17 NOTE — Telephone Encounter (Signed)
That is fine  Dr. Brand Males, M.D., [facility].C.P Pulmonary and Critical Care Medicine Staff Physician [facility] Pager: [phone], If no answer or between  15:00h - 7:00h: call [phone]  05/17/2013 12:01 PM

## 2013-05-17 NOTE — Telephone Encounter (Signed)
Called pt son and appt scheduled. Nothing further needed

## 2013-05-17 NOTE — Telephone Encounter (Signed)
Fine with me

## 2013-05-19 ENCOUNTER — Encounter (HOSPITAL_COMMUNITY)
Admission: RE | Admit: 2013-05-19 | Discharge: 2013-05-19 | Disposition: A | Discharge status: Home / Self Care | Payer: Self-pay | Source: Ambulatory Visit | Attending: Internal Medicine | Admitting: Internal Medicine

## 2013-05-24 ENCOUNTER — Encounter (HOSPITAL_COMMUNITY)
Admission: RE | Admit: 2013-05-24 | Discharge: 2013-05-24 | Disposition: A | Discharge status: Home / Self Care | Payer: Self-pay | Source: Ambulatory Visit | Attending: Internal Medicine | Admitting: Internal Medicine

## 2013-05-26 ENCOUNTER — Encounter (HOSPITAL_COMMUNITY): Payer: Medicare Other

## 2013-05-31 ENCOUNTER — Encounter (HOSPITAL_COMMUNITY)
Admission: RE | Admit: 2013-05-31 | Discharge: 2013-05-31 | Disposition: A | Discharge status: Home / Self Care | Payer: Self-pay | Source: Ambulatory Visit | Attending: Internal Medicine | Admitting: Internal Medicine

## 2013-05-31 DIAGNOSIS — J9610 Chronic respiratory failure, unspecified whether with hypoxia or hypercapnia: Secondary | ICD-10-CM | POA: Insufficient documentation

## 2013-05-31 DIAGNOSIS — J8410 Pulmonary fibrosis, unspecified: Secondary | ICD-10-CM | POA: Insufficient documentation

## 2013-05-31 DIAGNOSIS — Z5189 Encounter for other specified aftercare: Secondary | ICD-10-CM | POA: Insufficient documentation

## 2013-06-02 ENCOUNTER — Encounter (HOSPITAL_COMMUNITY)
Admission: RE | Admit: 2013-06-02 | Discharge: 2013-06-02 | Disposition: A | Discharge status: Home / Self Care | Payer: Self-pay | Source: Ambulatory Visit | Attending: Internal Medicine | Admitting: Internal Medicine

## 2013-06-07 ENCOUNTER — Encounter (HOSPITAL_COMMUNITY): Payer: Medicare Other

## 2013-06-09 ENCOUNTER — Encounter (HOSPITAL_COMMUNITY)
Admission: RE | Admit: 2013-06-09 | Discharge: 2013-06-09 | Disposition: A | Discharge status: Home / Self Care | Payer: Self-pay | Source: Ambulatory Visit | Attending: Internal Medicine | Admitting: Internal Medicine

## 2013-06-14 ENCOUNTER — Encounter (HOSPITAL_COMMUNITY): Payer: Medicare Other

## 2013-06-15 ENCOUNTER — Encounter: Payer: Self-pay | Admitting: Internal Medicine

## 2013-06-15 ENCOUNTER — Ambulatory Visit (INDEPENDENT_AMBULATORY_CARE_PROVIDER_SITE_OTHER): Payer: Medicare Other | Admitting: Internal Medicine

## 2013-06-15 VITALS — BP 128/64 | HR 75 | Ht <= 58 in | Wt 128.4 lb

## 2013-06-15 DIAGNOSIS — J8410 Pulmonary fibrosis, unspecified: Secondary | ICD-10-CM

## 2013-06-15 MED ORDER — HYDROCOD POLST-CHLORPHEN POLST 10-8 MG/5ML PO LQCR: 5.0000 mL | Freq: Two times a day (BID) | ORAL | Status: DC

## 2013-06-15 NOTE — Progress Notes (Signed)
Subjective:    Patient ID: Donna Watts, female    DOB: [birth year], 80 y.o.   MRN: [id number] PCP FULP, CAMMIE, MD Rheum: Dr Gavin Pound  HPI  #RA-ILD/UIP pattern  -  h/o cough onset around 2005 with a cxr showing scarring per Dr Inda Merlin office records c/w PF -  - Diagnosed RA with arthritis and ? EN summer 2014  - First CT chest June 2010: R> L UIP pattern  -Followup CT chest June 2014 - R > L UIP pattern much worse   - - PFT's November 09, 2008 VC 2.03 ( 92%) DLC0 52%  - PFT's October 25, 2009 VC 2.0 (92%)   DLC0 58%  -PFT's  09/25/10            VC1.88 (86%)  DLCO 54 - Ex desat documented 11/09/08 x 1 lap > 84% p 2 laps October 25, 2009 > 84% p one lap 08/27/2010     > 87 p 2 laps RA 09/25/2010 > 83% p 2 laps at a very rapid pace 12/02/10 rec pace or wear 02, wants to try pacing - 07/04/2011   Walked RA x one lap @ 185 stopped due to  sats 87% at nl pace > 2lpm - 08/29/2011   Walked RA x one lap @ 185 stopped due to  desat to 88, improved but not eliminated on 2lpm so rec use 3lpm with ex - - PFT May 2014: FVC 1.3L/67%, DLCO 6.4/36%   - Rx   - PPI since 2010  - Immuran/Pred since 2014   - O2 dependent x ? 2012     - Jan 2015: 3L at rest, 5L with exertion, 8L uphill    OV 06/15/2013  Chief Complaint  Patient presents with  . Advice Only    Wert pt switching to MR.  Pt states her breathing is stable, c/o SOB, nonprod cough.   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 She presents with her son. This is a transfer of care from Dr. Legrand Como wert to Dr. Chase Caller myself. Patient and her son reports stable health. She uses 3 L of oxygen at rest, 5 L with exertion and 8 L when she walks uphill. She feels overall she's stable and does most of her activities of daily living without any problems. She has persistent cough for which he wants quality of life improvement.  Given the fact in seeing her for the first time I did the history retake and I confirmed the above findings  which is documented in Dr. Altha Harm note really well. Even though she feels stable, it is UIP pattern anad has been slowly and steadily progressive based on O2 use, PFT and CT. Currently is in severe disease bracket right now.  They had questions on prognosis as well They had question on therapy to slow down fibrosis   Review of Systems  Constitutional: Negative for fever and unexpected weight change.  HENT: Negative for congestion, dental problem, ear pain, nosebleeds, postnasal drip, rhinorrhea, sinus pressure, sneezing, sore throat and trouble swallowing.   Eyes: Negative for redness and itching.  Respiratory: Positive for cough and shortness of breath. Negative for chest tightness and wheezing.   Cardiovascular: Negative for palpitations and leg swelling.  Gastrointestinal: Negative for nausea and vomiting.  Genitourinary: Negative for dysuria.  Musculoskeletal: Negative for joint swelling.  Skin: Negative for rash.  Neurological: Negative for headaches.  Hematological: Does not bruise/bleed easily.  Psychiatric/Behavioral: Negative for dysphoric mood. The patient is  not nervous/anxious.        Objective:   Physical Exam  Vitals reviewed. Constitutional: She is oriented to person, place, and time. She appears well-developed and well-nourished. No distress.  Pleasant lady  HENT:  Head: Normocephalic and atraumatic.  Right Ear: External ear normal.  Left Ear: External ear normal.  Mouth/Throat: Oropharynx is clear and moist. No oropharyngeal exudate.  Eyes: Conjunctivae and EOM are normal. Pupils are equal, round, and reactive to light. Right eye exhibits no discharge. Left eye exhibits no discharge. No scleral icterus.  Neck: Normal range of motion. Neck supple. No JVD present. No tracheal deviation present. No thyromegaly present.  Cardiovascular: Normal rate, regular rhythm, normal heart sounds and intact distal pulses.  Exam reveals no gallop and no friction rub.   No  murmur heard. Pulmonary/Chest: Effort normal and breath sounds normal. No respiratory distress. She has no wheezes. She has no rales. She exhibits no tenderness.  Abdominal: Soft. Bowel sounds are normal. She exhibits no distension and no mass. There is no tenderness. There is no rebound and no guarding.  Musculoskeletal: Normal range of motion. She exhibits no edema and no tenderness.  Lymphadenopathy:    She has no cervical adenopathy.  Neurological: She is alert and oriented to person, place, and time. She has normal reflexes. No cranial nerve deficit. She exhibits normal muscle tone. Coordination normal.  Skin: Skin is warm and dry. No rash noted. She is not diaphoretic. No erythema. No pallor.  Psychiatric: She has a normal mood and affect. Her behavior is normal. Judgment and thought content normal.          Assessment & Plan:

## 2013-06-15 NOTE — Patient Instructions (Signed)
You have severe pulmonary fibrosis For cough - try tussionex 58m twice daily x 30 days I will talk to Dr HTrudie Reedabout cellcept but for now continue immuran and her regimen See you back in 3 months Full PFT test at time of followup in 3 months

## 2013-06-16 ENCOUNTER — Encounter (HOSPITAL_COMMUNITY): Payer: Medicare Other

## 2013-06-18 ENCOUNTER — Telehealth: Payer: Self-pay | Admitting: Internal Medicine

## 2013-06-18 NOTE — Telephone Encounter (Signed)
Can you get Dr Gavin Pound to call me please?  Thanks  M

## 2013-06-18 NOTE — Assessment & Plan Note (Signed)
You have severe pulmonary fibrosis For cough - try tussionex 82m twice daily x 30 days I will talk to Dr HTrudie Reedabout cellcept but for now continue immuran and her regimen Prognosis of few years to several years overall explained but more precise quantification difficult See you back in 3 months Full PFT test at time of followup in 3 month   > 50% of this > 25 min visit spent in face to face counseling (15 min visit converted to 25 min)

## 2013-06-21 ENCOUNTER — Encounter (HOSPITAL_COMMUNITY): Payer: Medicare Other

## 2013-06-23 ENCOUNTER — Encounter (HOSPITAL_COMMUNITY): Payer: Medicare Other

## 2013-06-23 ENCOUNTER — Encounter (HOSPITAL_BASED_OUTPATIENT_CLINIC_OR_DEPARTMENT_OTHER): Payer: Medicare Other

## 2013-06-24 NOTE — Telephone Encounter (Signed)
[facility] office is closed, WCB.,[place] Bing, CMA

## 2013-06-27 MED ORDER — HYDROCOD POLST-CHLORPHEN POLST 10-8 MG/5ML PO LQCR: 5.0000 mL | Freq: Two times a day (BID) | ORAL | Status: DC

## 2013-06-27 NOTE — Telephone Encounter (Signed)
Ok to refill but please tell son that moving forward new DEA and fed law is strict on this and we have to stick to 30 day rules on this.   Is this helping?  Thanks  Dr. Brand Males, M.D., [facility].C.P Pulmonary and Critical Care Medicine Staff Physician [place] [facility] Pager: [phone], If no answer or between  15:00h - 7:00h: call [phone]  06/27/2013 12:24 PM

## 2013-06-27 NOTE — Telephone Encounter (Signed)
Son calling for tussionex refill.  [phone]

## 2013-06-27 NOTE — Telephone Encounter (Signed)
Called, spoke with pt's son.  He is requesting to pick up tussionex rx today, if possible.   Reports pt is "almost finished" and has maybe 1-2 days left. Tussionex rx last given on 06/15/13 #240 take 5 ml bid no refills Last OV with MR 06/15/13 with the following instructions:  Patient Instructions     You have severe pulmonary fibrosis  For cough - try tussionex 93m twice daily x 30 days  I will talk to Dr HTrudie Reedabout cellcept but for now continue immuran and her regimen  See you back in 3 months  Full PFT test at time of followup in 3 months   MR, the rx given on 06/15/13 should last pt 24 days if she is taking it 5 mLs bid.  When I asked pt's son how she was taking it, his original response was "10 mL 1-2 times daily."  I then advised we have the instructions to be 5 mLs bid.  He then stated, "Oh yea, I meant 5 mLs 1-2 times daily."  However, son reports pt only has 1-2 days of medication left.  I did verify that they got the tussionex rx filled from the 06/15/13 OV with son.  Please advise on refill.  Thank you.   I called Dr. HTrudie Reedoffice at [phone] spoke with ACaryl Pina  I have given her MR's cell #.  She will have Dr. HTrudie Reedcall.

## 2013-06-27 NOTE — Telephone Encounter (Signed)
Pt's son is aware that the prescription is ready to be picked up. Nothing further was needed at this time.

## 2013-06-27 NOTE — Telephone Encounter (Signed)
Per MR, ok to fill the tussionex for 5 mLs bid # 300 mL x 0. I have printed rx and placed at front. Called, spoke with pt - she is requesting we call back in 1 hr to speak with her son about below per MR.  Will hold msg in triage to ensure f/u as it was created by MR.

## 2013-06-28 ENCOUNTER — Encounter (HOSPITAL_COMMUNITY): Payer: Medicare Other

## 2013-06-28 DIAGNOSIS — J8410 Pulmonary fibrosis, unspecified: Secondary | ICD-10-CM | POA: Insufficient documentation

## 2013-06-28 DIAGNOSIS — J9610 Chronic respiratory failure, unspecified whether with hypoxia or hypercapnia: Secondary | ICD-10-CM | POA: Insufficient documentation

## 2013-06-28 DIAGNOSIS — Z5189 Encounter for other specified aftercare: Secondary | ICD-10-CM | POA: Insufficient documentation

## 2013-06-30 ENCOUNTER — Encounter (HOSPITAL_COMMUNITY): Payer: Medicare Other

## 2013-07-01 ENCOUNTER — Encounter (HOSPITAL_BASED_OUTPATIENT_CLINIC_OR_DEPARTMENT_OTHER): Payer: Medicare Other | Attending: General Surgery

## 2013-07-01 DIAGNOSIS — L97809 Non-pressure chronic ulcer of other part of unspecified lower leg with unspecified severity: Secondary | ICD-10-CM | POA: Insufficient documentation

## 2013-07-01 DIAGNOSIS — I872 Venous insufficiency (chronic) (peripheral): Secondary | ICD-10-CM | POA: Insufficient documentation

## 2013-07-01 DIAGNOSIS — L089 Local infection of the skin and subcutaneous tissue, unspecified: Secondary | ICD-10-CM | POA: Insufficient documentation

## 2013-07-01 LAB — GLUCOSE, CAPILLARY: Glucose-Capillary: 82 mg/dL (ref 70–99)

## 2013-07-05 ENCOUNTER — Encounter (HOSPITAL_COMMUNITY): Payer: Medicare Other

## 2013-07-07 ENCOUNTER — Encounter (HOSPITAL_COMMUNITY): Payer: Medicare Other

## 2013-07-07 DIAGNOSIS — C4310 Malignant melanoma of unspecified eyelid, including canthus: Secondary | ICD-10-CM | POA: Insufficient documentation

## 2013-07-07 DIAGNOSIS — H0289 Other specified disorders of eyelid: Secondary | ICD-10-CM | POA: Insufficient documentation

## 2013-07-07 DIAGNOSIS — M0579 Rheumatoid arthritis with rheumatoid factor of multiple sites without organ or systems involvement: Secondary | ICD-10-CM | POA: Insufficient documentation

## 2013-07-07 DIAGNOSIS — H02889 Meibomian gland dysfunction of unspecified eye, unspecified eyelid: Secondary | ICD-10-CM | POA: Insufficient documentation

## 2013-07-07 DIAGNOSIS — H0259 Other disorders affecting eyelid function: Secondary | ICD-10-CM | POA: Insufficient documentation

## 2013-07-12 ENCOUNTER — Encounter (HOSPITAL_COMMUNITY)
Admission: RE | Admit: 2013-07-12 | Discharge: 2013-07-12 | Disposition: A | Discharge status: Home / Self Care | Payer: Self-pay | Source: Ambulatory Visit | Attending: Internal Medicine | Admitting: Internal Medicine

## 2013-07-14 ENCOUNTER — Encounter (HOSPITAL_COMMUNITY)
Admission: RE | Admit: 2013-07-14 | Discharge: 2013-07-14 | Disposition: A | Discharge status: Home / Self Care | Payer: Self-pay | Source: Ambulatory Visit | Attending: Internal Medicine | Admitting: Internal Medicine

## 2013-07-19 ENCOUNTER — Encounter (HOSPITAL_COMMUNITY): Payer: Medicare Other

## 2013-07-21 ENCOUNTER — Encounter (HOSPITAL_COMMUNITY): Payer: Medicare Other

## 2013-07-26 ENCOUNTER — Encounter (HOSPITAL_COMMUNITY): Payer: Medicare Other

## 2013-07-28 ENCOUNTER — Encounter (HOSPITAL_COMMUNITY): Payer: Self-pay

## 2013-07-28 DIAGNOSIS — Z5189 Encounter for other specified aftercare: Secondary | ICD-10-CM | POA: Insufficient documentation

## 2013-07-28 DIAGNOSIS — J9610 Chronic respiratory failure, unspecified whether with hypoxia or hypercapnia: Secondary | ICD-10-CM | POA: Insufficient documentation

## 2013-07-28 DIAGNOSIS — J8410 Pulmonary fibrosis, unspecified: Secondary | ICD-10-CM | POA: Insufficient documentation

## 2013-07-29 ENCOUNTER — Encounter (HOSPITAL_BASED_OUTPATIENT_CLINIC_OR_DEPARTMENT_OTHER): Payer: Medicare Other | Attending: General Surgery

## 2013-07-29 DIAGNOSIS — I872 Venous insufficiency (chronic) (peripheral): Secondary | ICD-10-CM | POA: Insufficient documentation

## 2013-07-29 DIAGNOSIS — J449 Chronic obstructive pulmonary disease, unspecified: Secondary | ICD-10-CM | POA: Insufficient documentation

## 2013-07-29 DIAGNOSIS — J4489 Other specified chronic obstructive pulmonary disease: Secondary | ICD-10-CM | POA: Insufficient documentation

## 2013-07-29 DIAGNOSIS — L97809 Non-pressure chronic ulcer of other part of unspecified lower leg with unspecified severity: Secondary | ICD-10-CM | POA: Insufficient documentation

## 2013-08-02 ENCOUNTER — Encounter (HOSPITAL_COMMUNITY)
Admission: RE | Admit: 2013-08-02 | Discharge: 2013-08-02 | Disposition: A | Discharge status: Home / Self Care | Payer: Self-pay | Source: Ambulatory Visit | Attending: Internal Medicine | Admitting: Internal Medicine

## 2013-08-02 NOTE — Progress Notes (Signed)
Donna Watts arrived to pulmonary rehab today with oxygen saturation of 84% on 6 liters of oxygen today.  While walking on the track to maintain her saturations above 90% she was placed on a oxymask at 10L which maintained her saturations in the 95% range.  She was able to be decreased to 6 L while on the nustep machine.  I reported the above information to her daughter-in-law who is a Librarian, academic.

## 2013-08-04 ENCOUNTER — Encounter (HOSPITAL_COMMUNITY)
Admission: RE | Admit: 2013-08-04 | Discharge: 2013-08-04 | Disposition: A | Discharge status: Home / Self Care | Payer: Self-pay | Source: Ambulatory Visit | Attending: Internal Medicine | Admitting: Internal Medicine

## 2013-08-04 ENCOUNTER — Telehealth (HOSPITAL_COMMUNITY): Payer: Self-pay | Admitting: *Deleted

## 2013-08-09 ENCOUNTER — Encounter (HOSPITAL_COMMUNITY): Payer: Self-pay

## 2013-08-10 ENCOUNTER — Telehealth: Payer: Self-pay | Admitting: Internal Medicine

## 2013-08-10 NOTE — Telephone Encounter (Signed)
Got this message below 08/04/13 via email from REhab. They advised patient family to make an earlier appt with me. Currently schedule to see me mid-may 2015. See if she can come see me or NP sooner  Thanks  Dr. Brand Males, M.D., [facility].C.P Pulmonary and Critical Care Medicine Staff Physician [place] [facility] Pager: [phone], If no answer or between  15:00h - 7:00h: call [phone]  08/10/2013 4:16 PM     Hi Dr. Chase Caller, Donna Watts DOB 10/29/1929 has arrived to pulmonary maintenance the last 2 sessions with oxygen saturations 79-81% on 6 liters after walking in.  We have had to place her on an oxymask @ 10 liters to keep her oxygen saturations above 90% while walking on the track.  The other 2 exercise sessions were seated on the nu-step and we were able to reduce her to 6 liters.  This is a dramatic change for [name].  She had used a pendant at home once and did not like it.  Any suggestions?  I feel her lung function may be declining.  She does not feel she is getting sick with a respiratory infection, and does not have any other symptoms.  I have discussed this information with Violeta Gelinas, her daughter-in-law who is a Engineer, mining.   Thanks, Rosebud Poles RN Pulmonary Rehab

## 2013-08-10 NOTE — Telephone Encounter (Signed)
Spoke with pt son and appt scheduled to see TP 08/12/13. Nothing further needed

## 2013-08-11 ENCOUNTER — Encounter (HOSPITAL_COMMUNITY)
Admission: RE | Admit: 2013-08-11 | Discharge: 2013-08-11 | Disposition: A | Discharge status: Home / Self Care | Payer: Self-pay | Source: Ambulatory Visit | Attending: Internal Medicine | Admitting: Internal Medicine

## 2013-08-12 ENCOUNTER — Ambulatory Visit: Payer: Medicare Other | Admitting: Adult Health

## 2013-08-16 ENCOUNTER — Encounter (HOSPITAL_COMMUNITY)
Admission: RE | Admit: 2013-08-16 | Discharge: 2013-08-16 | Disposition: A | Discharge status: Home / Self Care | Payer: Self-pay | Source: Ambulatory Visit | Attending: Internal Medicine | Admitting: Internal Medicine

## 2013-08-18 ENCOUNTER — Encounter (HOSPITAL_COMMUNITY)
Admission: RE | Admit: 2013-08-18 | Discharge: 2013-08-18 | Disposition: A | Discharge status: Home / Self Care | Payer: Self-pay | Source: Ambulatory Visit | Attending: Internal Medicine | Admitting: Internal Medicine

## 2013-08-23 ENCOUNTER — Encounter (HOSPITAL_COMMUNITY)
Admission: RE | Admit: 2013-08-23 | Discharge: 2013-08-23 | Disposition: A | Discharge status: Home / Self Care | Payer: Self-pay | Source: Ambulatory Visit | Attending: Internal Medicine | Admitting: Internal Medicine

## 2013-08-25 ENCOUNTER — Encounter (HOSPITAL_COMMUNITY): Payer: Self-pay

## 2013-08-26 ENCOUNTER — Encounter (HOSPITAL_BASED_OUTPATIENT_CLINIC_OR_DEPARTMENT_OTHER): Payer: Medicare Other | Attending: General Surgery

## 2013-08-26 DIAGNOSIS — L97809 Non-pressure chronic ulcer of other part of unspecified lower leg with unspecified severity: Secondary | ICD-10-CM | POA: Insufficient documentation

## 2013-08-26 DIAGNOSIS — L97909 Non-pressure chronic ulcer of unspecified part of unspecified lower leg with unspecified severity: Principal | ICD-10-CM

## 2013-08-26 DIAGNOSIS — I87339 Chronic venous hypertension (idiopathic) with ulcer and inflammation of unspecified lower extremity: Secondary | ICD-10-CM | POA: Insufficient documentation

## 2013-08-29 DIAGNOSIS — H04129 Dry eye syndrome of unspecified lacrimal gland: Secondary | ICD-10-CM | POA: Insufficient documentation

## 2013-08-29 DIAGNOSIS — H11429 Conjunctival edema, unspecified eye: Secondary | ICD-10-CM | POA: Insufficient documentation

## 2013-08-30 ENCOUNTER — Encounter (HOSPITAL_COMMUNITY): Payer: Medicare Other

## 2013-08-30 DIAGNOSIS — Z5189 Encounter for other specified aftercare: Secondary | ICD-10-CM | POA: Insufficient documentation

## 2013-08-30 DIAGNOSIS — J9610 Chronic respiratory failure, unspecified whether with hypoxia or hypercapnia: Secondary | ICD-10-CM | POA: Insufficient documentation

## 2013-08-30 DIAGNOSIS — J8410 Pulmonary fibrosis, unspecified: Secondary | ICD-10-CM | POA: Insufficient documentation

## 2013-09-01 ENCOUNTER — Encounter (HOSPITAL_COMMUNITY)
Admission: RE | Admit: 2013-09-01 | Discharge: 2013-09-01 | Disposition: A | Discharge status: Home / Self Care | Payer: Self-pay | Source: Ambulatory Visit | Attending: Internal Medicine | Admitting: Internal Medicine

## 2013-09-06 ENCOUNTER — Encounter (HOSPITAL_COMMUNITY)
Admission: RE | Admit: 2013-09-06 | Discharge: 2013-09-06 | Disposition: A | Discharge status: Home / Self Care | Payer: Self-pay | Source: Ambulatory Visit | Attending: Internal Medicine | Admitting: Internal Medicine

## 2013-09-08 ENCOUNTER — Encounter (HOSPITAL_COMMUNITY)
Admission: RE | Admit: 2013-09-08 | Discharge: 2013-09-08 | Disposition: A | Discharge status: Home / Self Care | Payer: Self-pay | Source: Ambulatory Visit | Attending: Internal Medicine | Admitting: Internal Medicine

## 2013-09-12 ENCOUNTER — Ambulatory Visit: Payer: Medicare Other | Admitting: Internal Medicine

## 2013-09-12 ENCOUNTER — Encounter: Payer: Self-pay | Admitting: Internal Medicine

## 2013-09-12 VITALS — BP 140/62 | HR 94 | Ht <= 58 in | Wt 124.8 lb

## 2013-09-12 DIAGNOSIS — J8410 Pulmonary fibrosis, unspecified: Secondary | ICD-10-CM

## 2013-09-12 NOTE — Progress Notes (Signed)
Subjective:    Patient ID: Donna Watts, female    DOB: Dec 02, 1929, 80 y.o.   MRN: [id number]  HPI  PCP FULP, CAMMIE, MD Rheum: Dr Gavin Pound  HPI  #RA-ILD/UIP pattern  -  h/o cough onset around 2005 with a cxr showing scarring per Dr Inda Merlin office records c/w PF -  - Diagnosed RA with arthritis and ? EN summer 2014  - First CT chest June 2010: R> L UIP pattern  -Followup CT chest June 2014 - R > L UIP pattern much worse   - - PFT's November 09, 2008 VC 2.03 ( 92%) DLC0 52%  - PFT's October 25, 2009 VC 2.0 (92%)   DLC0 58%  -PFT's  09/25/10            VC1.88 (86%)  DLCO 54 - Ex desat documented 11/09/08 x 1 lap > 84% p 2 laps October 25, 2009 > 84% p one lap 08/27/2010     > 87 p 2 laps RA 09/25/2010 > 83% p 2 laps at a very rapid pace 12/02/10 rec pace or wear 02, wants to try pacing - 07/04/2011   Walked RA x one lap @ 185 stopped due to  sats 87% at nl pace > 2lpm - 08/29/2011   Walked RA x one lap @ 185 stopped due to  desat to 88, improved but not eliminated on 2lpm so rec use 3lpm with ex - - PFT May 2014: FVC 1.3L/67%, DLCO 6.4/36%   - Rx   - PPI since 2010  - Immuran/Pred since 2014   - O2 dependent x ? 2012     - Jan 2015: 3L at rest, 5L with exertion, 8L uphill    OV 06/15/2013  Chief Complaint  Patient presents with  . Advice Only    Wert pt switching to MR.  Pt states her breathing is stable, c/o SOB, nonprod cough.   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 She presents with her son. This is a transfer of care from Dr. Legrand Como wert to Dr. Chase Caller myself. Patient and her son reports stable health. She uses 3 L of oxygen at rest, 5 L with exertion and 8 L when she walks uphill. She feels overall she's stable and does most of her activities of daily living without any problems. She has persistent cough for which he wants quality of life improvement.  Given the fact in seeing her for the first time I did the history retake and I confirmed the above  findings which is documented in Dr. Altha Harm note really well. Even though she feels stable, it is UIP pattern anad has been slowly and steadily progressive based on O2 use, PFT and CT. Currently is in severe disease bracket right now.  They had questions on prognosis as well They had question on therapy to slow down fibrosis  REC You have severe pulmonary fibrosis For cough - try tussionex 12m twice daily x 30 days I will talk to Dr HTrudie Reedabout cellcept but for now continue immuran and her regimen See you back in 3 months Full PFT test at time of followup in 3 months  OV 09/12/2013  Chief Complaint  Patient presents with  . Follow-up    Pt states her breathing has improved. C/o DOE, intermittent productive cough with white mucous. Denies CP.    Followup interstitial lung disease UIP pattern secondary to rheumatoid arthritis  Since last visit and her 2015 she has seen Dr. ALevada Dy  Hawkes of rheumatology. She was started on CellCept in March 2015. According to her and her son the higher doses causes side effects. Therefore she is back down 500 milligrams twice a day. She says she is tolerating this well. She is attending pulmonary rehabilitation. Overall she's feeling much better than her previous baseline with reduced dyspnea, increased quality of life and increase physical conditioning. Apparently pulmonary rehabilitation helps her a lot. She was supposed to have pulmonary function test today but she did not fall unclear reasons perhaps due to scheduling. She continues to use oxygen 3 L at night at rest. At daytime she uses 6 L. And even with a 6 L when she overexerts herself evidence of the house she is transient desaturation to the 70s but this is baseline  SPirometry in office today shows FVC of 1.34/71%. FEV1 was 1.06 L or 76%. Ratio 79/104%. Stable since May 2014  Review of Systems  Constitutional: Negative for fever and unexpected weight change.  HENT: Negative for congestion,  dental problem, ear pain, nosebleeds, postnasal drip, rhinorrhea, sinus pressure, sneezing, sore throat and trouble swallowing.   Eyes: Negative for redness and itching.  Respiratory: Positive for cough and shortness of breath. Negative for chest tightness and wheezing.   Cardiovascular: Negative for palpitations and leg swelling.  Gastrointestinal: Negative for nausea and vomiting.  Genitourinary: Negative for dysuria.  Musculoskeletal: Positive for joint swelling.  Skin: Negative for rash.  Neurological: Negative for headaches.  Hematological: Does not bruise/bleed easily.  Psychiatric/Behavioral: Negative for dysphoric mood. The patient is not nervous/anxious.        Objective:   Physical Exam  itals reviewed. Constitutional: She is oriented to person, place, and time. She appears well-developed and well-nourished. No distress.  Pleasant lady  HENT:  Head: Normocephalic and atraumatic.  Right Ear: External ear normal.  Left Ear: External ear normal.  Mouth/Throat: Oropharynx is clear and moist. No oropharyngeal exudate.  Eyes: Conjunctivae and EOM are normal. Pupils are equal, round, and reactive to light. Right eye exhibits no discharge. Left eye exhibits no discharge. No scleral icterus.  Neck: Normal range of motion. Neck supple. No JVD present. No tracheal deviation present. No thyromegaly present.  Cardiovascular: Normal rate, regular rhythm, normal heart sounds and intact distal pulses.  Exam reveals no gallop and no friction rub.   No murmur heard. Pulmonary/Chest: Effort normal and breath sounds normal. No respiratory distress. She has no wheezes.  She exhibits no tenderness. Significant bibasal rales - half way up., also in UL in front Abdominal: Soft. Bowel sounds are normal. She exhibits no distension and no mass. There is no tenderness. There is no rebound and no guarding.  Musculoskeletal: Normal range of motion. She exhibits no edema and no tenderness.    Lymphadenopathy:    She has no cervical adenopathy.  Neurological: She is alert and oriented to person, place, and time. She has normal reflexes. No cranial nerve deficit. She exhibits normal muscle tone. Coordination normal.  Skin: Skin is warm and dry. No rash noted. She is not diaphoretic. No erythema. No pallor.  Psychiatric: She has a normal mood and affect. Her behavior is normal. Judgment and thought content normal.           Assessment & Plan:

## 2013-09-12 NOTE — Patient Instructions (Addendum)
#  Interstitial Lung Disease  - glad you are feeling better  - continue cellcept and prednisone under direction of Dr Trudie Reed - use oxygen  - do spirometry today - continue rehab  #Followup PFT in 3 months Office visit in 3 months;  3 months with full PFT breathing test done at time of followup

## 2013-09-13 ENCOUNTER — Encounter (HOSPITAL_COMMUNITY)
Admission: RE | Admit: 2013-09-13 | Discharge: 2013-09-13 | Disposition: A | Discharge status: Home / Self Care | Payer: Self-pay | Source: Ambulatory Visit | Attending: Internal Medicine | Admitting: Internal Medicine

## 2013-09-13 NOTE — Assessment & Plan Note (Signed)
#  Interstitial Lung Disease  - glad you are feeling better  - continue cellcept and prednisone under direction of Dr Trudie Reed - use oxygen  - do spirometry today - continue rehab  #Followup PFT in 3 months Office visit in 3 months;  3 months with full PFT breathing test done at time of followup   > 50% of this > 25 min visit spent in face to face counseling (15 min visit converted to 25 min)

## 2013-09-15 ENCOUNTER — Encounter (HOSPITAL_COMMUNITY): Payer: Self-pay

## 2013-09-20 ENCOUNTER — Encounter (HOSPITAL_COMMUNITY): Payer: Self-pay

## 2013-09-22 ENCOUNTER — Encounter (HOSPITAL_COMMUNITY)
Admission: RE | Admit: 2013-09-22 | Discharge: 2013-09-22 | Disposition: A | Discharge status: Home / Self Care | Payer: Self-pay | Source: Ambulatory Visit | Attending: Internal Medicine | Admitting: Internal Medicine

## 2013-09-27 ENCOUNTER — Encounter (HOSPITAL_COMMUNITY)
Admission: RE | Admit: 2013-09-27 | Discharge: 2013-09-27 | Disposition: A | Discharge status: Home / Self Care | Payer: Self-pay | Source: Ambulatory Visit | Attending: Internal Medicine | Admitting: Internal Medicine

## 2013-09-27 DIAGNOSIS — J9610 Chronic respiratory failure, unspecified whether with hypoxia or hypercapnia: Secondary | ICD-10-CM | POA: Insufficient documentation

## 2013-09-27 DIAGNOSIS — Z5189 Encounter for other specified aftercare: Secondary | ICD-10-CM | POA: Insufficient documentation

## 2013-09-27 DIAGNOSIS — J8410 Pulmonary fibrosis, unspecified: Secondary | ICD-10-CM | POA: Insufficient documentation

## 2013-09-29 ENCOUNTER — Encounter (HOSPITAL_COMMUNITY)
Admission: RE | Admit: 2013-09-29 | Discharge: 2013-09-29 | Disposition: A | Discharge status: Home / Self Care | Payer: Self-pay | Source: Ambulatory Visit | Attending: Internal Medicine | Admitting: Internal Medicine

## 2013-10-04 ENCOUNTER — Encounter (HOSPITAL_COMMUNITY): Payer: Self-pay

## 2013-10-06 ENCOUNTER — Encounter (HOSPITAL_COMMUNITY)
Admission: RE | Admit: 2013-10-06 | Discharge: 2013-10-06 | Disposition: A | Discharge status: Home / Self Care | Payer: Self-pay | Source: Ambulatory Visit | Attending: Internal Medicine | Admitting: Internal Medicine

## 2013-10-11 ENCOUNTER — Encounter (HOSPITAL_COMMUNITY)
Admission: RE | Admit: 2013-10-11 | Discharge: 2013-10-11 | Disposition: A | Discharge status: Home / Self Care | Payer: Self-pay | Source: Ambulatory Visit | Attending: Internal Medicine | Admitting: Internal Medicine

## 2013-10-13 ENCOUNTER — Encounter (HOSPITAL_COMMUNITY): Payer: Self-pay

## 2013-10-18 ENCOUNTER — Encounter (HOSPITAL_COMMUNITY): Payer: Self-pay

## 2013-10-20 ENCOUNTER — Encounter (HOSPITAL_COMMUNITY): Admission: RE | Admit: 2013-10-20 | Payer: Self-pay | Source: Ambulatory Visit

## 2013-10-21 ENCOUNTER — Telehealth (HOSPITAL_COMMUNITY): Payer: Self-pay

## 2013-10-25 ENCOUNTER — Encounter (HOSPITAL_COMMUNITY): Payer: Self-pay

## 2013-10-27 ENCOUNTER — Encounter (HOSPITAL_COMMUNITY)
Admission: RE | Admit: 2013-10-27 | Discharge: 2013-10-27 | Disposition: A | Discharge status: Home / Self Care | Payer: Self-pay | Source: Ambulatory Visit | Attending: Internal Medicine | Admitting: Internal Medicine

## 2013-10-27 DIAGNOSIS — Z5189 Encounter for other specified aftercare: Secondary | ICD-10-CM | POA: Insufficient documentation

## 2013-10-27 DIAGNOSIS — J8410 Pulmonary fibrosis, unspecified: Secondary | ICD-10-CM | POA: Insufficient documentation

## 2013-10-27 DIAGNOSIS — J9610 Chronic respiratory failure, unspecified whether with hypoxia or hypercapnia: Secondary | ICD-10-CM | POA: Insufficient documentation

## 2013-11-01 ENCOUNTER — Encounter (HOSPITAL_COMMUNITY): Payer: Self-pay

## 2013-11-03 ENCOUNTER — Encounter (HOSPITAL_COMMUNITY): Payer: Self-pay

## 2013-11-08 ENCOUNTER — Encounter (HOSPITAL_COMMUNITY): Payer: Self-pay

## 2013-11-10 ENCOUNTER — Encounter (HOSPITAL_COMMUNITY): Payer: Self-pay

## 2013-11-15 ENCOUNTER — Encounter (HOSPITAL_COMMUNITY)
Admission: RE | Admit: 2013-11-15 | Discharge: 2013-11-15 | Disposition: A | Discharge status: Home / Self Care | Payer: Self-pay | Source: Ambulatory Visit | Attending: Internal Medicine | Admitting: Internal Medicine

## 2013-11-17 ENCOUNTER — Encounter (HOSPITAL_COMMUNITY)
Admission: RE | Admit: 2013-11-17 | Discharge: 2013-11-17 | Disposition: A | Discharge status: Home / Self Care | Payer: Self-pay | Source: Ambulatory Visit | Attending: Internal Medicine | Admitting: Internal Medicine

## 2013-11-22 ENCOUNTER — Encounter (HOSPITAL_COMMUNITY): Admission: RE | Admit: 2013-11-22 | Payer: Self-pay | Source: Ambulatory Visit

## 2013-11-24 ENCOUNTER — Encounter (HOSPITAL_COMMUNITY)
Admission: RE | Admit: 2013-11-24 | Discharge: 2013-11-24 | Disposition: A | Discharge status: Home / Self Care | Payer: Self-pay | Source: Ambulatory Visit | Attending: Internal Medicine | Admitting: Internal Medicine

## 2013-11-29 ENCOUNTER — Encounter (HOSPITAL_COMMUNITY)
Admission: RE | Admit: 2013-11-29 | Discharge: 2013-11-29 | Disposition: A | Discharge status: Home / Self Care | Payer: Self-pay | Source: Ambulatory Visit | Attending: Internal Medicine | Admitting: Internal Medicine

## 2013-11-29 DIAGNOSIS — J9610 Chronic respiratory failure, unspecified whether with hypoxia or hypercapnia: Secondary | ICD-10-CM | POA: Insufficient documentation

## 2013-11-29 DIAGNOSIS — J8410 Pulmonary fibrosis, unspecified: Secondary | ICD-10-CM | POA: Insufficient documentation

## 2013-11-29 DIAGNOSIS — Z5189 Encounter for other specified aftercare: Secondary | ICD-10-CM | POA: Insufficient documentation

## 2013-12-01 ENCOUNTER — Encounter (HOSPITAL_COMMUNITY): Payer: Self-pay

## 2013-12-06 ENCOUNTER — Encounter (HOSPITAL_COMMUNITY): Payer: Self-pay

## 2013-12-08 ENCOUNTER — Encounter (HOSPITAL_COMMUNITY): Payer: Self-pay

## 2013-12-13 ENCOUNTER — Encounter (HOSPITAL_COMMUNITY)
Admission: RE | Admit: 2013-12-13 | Discharge: 2013-12-13 | Disposition: A | Discharge status: Home / Self Care | Payer: Self-pay | Source: Ambulatory Visit | Attending: Internal Medicine | Admitting: Internal Medicine

## 2013-12-15 ENCOUNTER — Encounter (HOSPITAL_COMMUNITY)
Admission: RE | Admit: 2013-12-15 | Discharge: 2013-12-15 | Disposition: A | Discharge status: Home / Self Care | Payer: Self-pay | Source: Ambulatory Visit | Attending: Internal Medicine | Admitting: Internal Medicine

## 2013-12-17 ENCOUNTER — Other Ambulatory Visit: Payer: Self-pay | Admitting: *Deleted

## 2013-12-17 ENCOUNTER — Encounter: Payer: Self-pay | Admitting: *Deleted

## 2013-12-20 ENCOUNTER — Encounter (HOSPITAL_COMMUNITY)
Admission: RE | Admit: 2013-12-20 | Discharge: 2013-12-20 | Disposition: A | Discharge status: Home / Self Care | Payer: Self-pay | Source: Ambulatory Visit | Attending: Internal Medicine | Admitting: Internal Medicine

## 2013-12-22 ENCOUNTER — Encounter: Payer: Self-pay | Admitting: Internal Medicine

## 2013-12-22 ENCOUNTER — Ambulatory Visit: Payer: Medicare Other | Admitting: Internal Medicine

## 2013-12-22 ENCOUNTER — Encounter (HOSPITAL_COMMUNITY)
Admission: RE | Admit: 2013-12-22 | Discharge: 2013-12-22 | Disposition: A | Discharge status: Home / Self Care | Payer: Self-pay | Source: Ambulatory Visit | Attending: Internal Medicine | Admitting: Internal Medicine

## 2013-12-22 VITALS — BP 104/54 | HR 90 | Ht 59.0 in | Wt 121.0 lb

## 2013-12-22 DIAGNOSIS — J8410 Pulmonary fibrosis, unspecified: Secondary | ICD-10-CM

## 2013-12-22 LAB — PULMONARY FUNCTION TEST
DL/VA % pred: 54 %
DL/VA: 2.23 ml/min/mmHg/L
DLCO unc % pred: 36 %
DLCO unc: 6.44 ml/min/mmHg
FEF 25-75 Post: 1.58 L/sec
FEF 25-75 Pre: 2.3 L/sec
FEF2575-%CHANGE-POST: -31 %
FEF2575-%PRED-POST: 154 %
FEF2575-%Pred-Pre: 225 %
FEV1-%Change-Post: -1 %
FEV1-%PRED-POST: 87 %
FEV1-%Pred-Pre: 89 %
FEV1-POST: 1.24 L
FEV1-PRE: 1.26 L
FEV1FVC-%CHANGE-POST: 0 %
FEV1FVC-%PRED-PRE: 125 %
FEV6-%Change-Post: -2 %
FEV6-%Pred-Post: 74 %
FEV6-%Pred-Pre: 76 %
FEV6-Post: 1.33 L
FEV6-Pre: 1.37 L
FEV6FVC-%Pred-Post: 107 %
FEV6FVC-%Pred-Pre: 107 %
FVC-%CHANGE-POST: -1 %
FVC-%Pred-Post: 69 %
FVC-%Pred-Pre: 71 %
FVC-PRE: 1.37 L
FVC-Post: 1.35 L
PRE FEV1/FVC RATIO: 92 %
PRE FEV6/FVC RATIO: 100 %
Post FEV1/FVC ratio: 92 %
Post FEV6/FVC ratio: 100 %
RV % PRED: 42 %
RV: 0.92 L
TLC % pred: 70 %
TLC: 3.02 L

## 2013-12-22 NOTE — Progress Notes (Signed)
PFT done today. 

## 2013-12-22 NOTE — Progress Notes (Signed)
Subjective:    Patient ID: Donna Watts, female    DOB: 19-Feb-1930, 80 y.o.   MRN: [id number]  HPI  HPI  PCP FULP, CAMMIE, MD Rheum: Dr Gavin Pound  HPI  #RA-ILD/UIP pattern  -  h/o cough onset around 2005 with a cxr showing scarring per Dr Inda Merlin office records c/w PF -  - Diagnosed RA with arthritis and ? EN summer 2014  - First CT chest June 2010: R> L UIP pattern  -Followup CT chest June 2014 - R > L UIP pattern much worse   - - PFT's November 09, 2008 VC 2.03 ( 92%) DLC0 52%  - PFT's October 25, 2009 VC 2.0 (92%)   DLC0 58%  -PFT's  09/25/10            VC1.88 (86%)  DLCO 54 - Ex desat documented 11/09/08 x 1 lap > 84% p 2 laps October 25, 2009 > 84% p one lap 08/27/2010     > 87 p 2 laps RA 09/25/2010 > 83% p 2 laps at a very rapid pace 12/02/10 rec pace or wear 02, wants to try pacing - 07/04/2011   Walked RA x one lap @ 185 stopped due to  sats 87% at nl pace > 2lpm - 08/29/2011   Walked RA x one lap @ 185 stopped due to  desat to 88, improved but not eliminated on 2lpm so rec use 3lpm with ex - - PFT May 2014: FVC 1.3L/67%, DLCO 6.4/36%   - > started on CellCept in March 2015. [name] May 2015: FVC of 1.34/71% - PFT August 2015: VC 1.3. The/69%. FEV1 1.2 L/87%. Ratio of 92/125%. DLCO of 6.4/36%.    - Rx   - PPI since 2010  - Immuran/Pred since 2014   - O2 dependent x ? 2012     - Jan 2015: 3L at rest, 5L with exertion, 8L uphill   - started on CellCept in March 2015.    .................................................. OV 09/12/2013  Chief Complaint  Patient presents with  . Follow-up    Pt states her breathing has improved. C/o DOE, intermittent productive cough with white mucous. Denies CP.    Followup interstitial lung disease UIP pattern secondary to rheumatoid arthritis  Since last visit and her 2015 she has seen Dr. Gavin Pound of rheumatology. She was started on CellCept in March 2015. According to her and her son the higher doses causes side effects. Therefore she  is back down 500 milligrams twice a day. She says she is tolerating this well. She is attending pulmonary rehabilitation. Overall she's feeling much better than her previous baseline with reduced dyspnea, increased quality of life and increase physical conditioning. Apparently pulmonary rehabilitation helps her a lot. She was supposed to have pulmonary function test today but she did not fall unclear reasons perhaps due to scheduling. She continues to use oxygen 3 L at night at rest. At daytime she uses 6 L. And even with a 6 L when she overexerts herself evidence of the house she is transient desaturation to the 70s but this is baseline  SPirometry in office today shows FVC of 1.34/71%. FEV1 was 1.06 L or 76%. Ratio 79/104%. Stable since May 2014   OV 12/22/2013  Chief Complaint  Patient presents with  . Follow-up    Pt with PFT results. Pt states her breathing is unchanged since last OV. Pt c/o DOE. Pt denies cough and Cp/tightness.    Followup interstitial lung disease pulmonary  fibrosis UIP pattern secondary to rheumatoid arthritis  She is here with her son Donna Watts. She reports overall stability in the sense of well-being. She takes CellCept 1074m in day, 50109mat night (son verified this the following day). She did not bring a medication list with her today. She continues to attend pulmonary rehabilitation. She feels that this helps the with her dyspnea, quality of life and physical conditioning. Son notices that now at rest that she even manages well with 1 L oxygen and they think that she can get by with 2 L of oxygen at rest at night. In the daytime and apparently at rehabilitation with 6 L she does not desaturate. Pulmonary function test that shows continued stability of her lung function since May of 2014 and especially since starting CellCept in the spring of this year 2015. At this point in time she does not have any problems tolerating the CellCept   Pulmonary function test August 27 today  shows FVC 1.3. The/69%. FEV1 1.2 L/87%. Ratio of 92/125%. DLCO of 6.4/36%. The numbers are stable since May 2014.  Review of Systems  Constitutional: Negative for fever and unexpected weight change.  HENT: Negative for congestion, dental problem, ear pain, nosebleeds, postnasal drip, rhinorrhea, sinus pressure, sneezing, sore throat and trouble swallowing.   Eyes: Negative for redness and itching.  Respiratory: Positive for cough and shortness of breath. Negative for chest tightness and wheezing.   Cardiovascular: Negative for palpitations and leg swelling.  Gastrointestinal: Negative for nausea and vomiting.  Genitourinary: Negative for dysuria.  Musculoskeletal: Negative for joint swelling.  Skin: Negative for rash.  Neurological: Negative for headaches.  Hematological: Does not bruise/bleed easily.  Psychiatric/Behavioral: Negative for dysphoric mood. The patient is not nervous/anxious.    Current outpatient prescriptions:albuterol (VENTOLIN HFA) 108 (90 BASE) MCG/ACT inhaler, Inhale 2 puffs into the lungs every 6 (six) hours as needed., Disp: 1 Inhaler, Rfl: 6;  aspirin 81 MG tablet, Take 81 mg by mouth daily.  , Disp: , Rfl: ;  azaTHIOprine (IMURAN) 50 MG tablet, Take 50 mg by mouth 2 (two) times daily. , Disp: , Rfl:  budesonide-formoterol (SYMBICORT) 160-4.5 MCG/ACT inhaler, Take 2 puffs first thing in am and then another 2 puffs about 12 hours later., Disp: 1 Inhaler, Rfl: 6;  Cholecalciferol (VITAMIN D) 2000 UNITS CAPS, Take 1 capsule by mouth daily., Disp: , Rfl: ;  dextromethorphan-guaiFENesin (MUCINEX DM) 30-600 MG per 12 hr tablet, Take 1 tablet by mouth every 12 (twelve) hours as needed. , Disp: , Rfl:  JANUMET XR 50-500 MG TB24, Take 1 tablet by mouth 2 (two) times daily. , Disp: , Rfl: ;  Multiple Vitamins-Minerals (CENTRUM SILVER PO), Take 1 tablet by mouth daily.  , Disp: , Rfl: ;  omeprazole (PRILOSEC) 20 MG capsule, Take 20 mg by mouth 2 (two) times daily as needed. , Disp: ,  Rfl: ;  predniSONE (DELTASONE) 5 MG tablet, Take 10 mg by mouth daily with breakfast. , Disp: , Rfl:  simvastatin (ZOCOR) 20 MG tablet, Take 20 mg by mouth at bedtime.  , Disp: , Rfl: ;  chlorpheniramine-HYDROcodone (TUSSIONEX PENNKINETIC ER) 10-8 MG/5ML LQCR, Take 5 mLs by mouth 2 (two) times daily., Disp: 300 mL, Rfl: 0;  fluticasone (FLONASE) 50 MCG/ACT nasal spray, Place 2 sprays into both nostrils daily. , Disp: , Rfl: ;  mycophenolate (CELLCEPT) 500 MG tablet, Take 1 tablet by mouth 2 (two) times daily., Disp: , Rfl:  predniSONE (DELTASONE) 1 MG tablet, Take 2 mg by mouth  daily with breakfast., Disp: , Rfl:      Objective:   Physical Exam  Filed Vitals:   12/22/13 1619  BP: 104/54  Pulse: 90  Height: _0  (1.499 m)  Weight: 121 lb (54.885 kg)  SpO2: 93%    itals reviewed. Constitutional: She is oriented to person, place, and time. She appears well-developed and well-nourished. No distress.  Pleasant lady  HENT:  Head: Normocephalic and atraumatic.  Right Ear: External ear normal.  Left Ear: External ear normal.  Mouth/Throat: Oropharynx is clear and moist. No oropharyngeal exudate.  Eyes: Conjunctivae and EOM are normal. Pupils are equal, round, and reactive to light. Right eye exhibits no discharge. Left eye exhibits no discharge. No scleral icterus.  Neck: Normal range of motion. Neck supple. No JVD present. No tracheal deviation present. No thyromegaly present.  Cardiovascular: Normal rate, regular rhythm, normal heart sounds and intact distal pulses.  Exam reveals no gallop and no friction rub.   No murmur heard. Pulmonary/Chest: Effort normal and breath sounds normal. No respiratory distress. She has no wheezes.  She exhibits no tenderness. Significant bibasal rales - half way up., also in UL in front Abdominal: Soft. Bowel sounds are normal. She exhibits no distension and no mass. There is no tenderness. There is no rebound and no guarding.  Musculoskeletal: Normal range  of motion. She exhibits no edema and no tenderness.  Lymphadenopathy:    She has no cervical adenopathy.  Neurological: She is alert and oriented to person, place, and time. She has normal reflexes. No cranial nerve deficit. She exhibits normal muscle tone. Coordination normal.  Skin: Skin is warm and dry. No rash noted. She is not diaphoretic. No erythema. No pallor.  Psychiatric: She has a normal mood and affect. Her behavior is normal. Judgment and thought content normal.          Assessment & Plan:  #Interstitial Lung Disease  - glad you are feeling better adn enjoying rehab - PFT show stability since May 2014  - continue cellcept and prednisone under direction of Dr Trudie Reed - use oxygen - goal pulse ox is > 88% - continue rehab  #Followup Office spirometry in 4 months (not June Leap) Office visit in 4 months with Dr Chase Caller

## 2013-12-22 NOTE — Patient Instructions (Signed)
#  Interstitial Lung Disease  - glad you are feeling better adn enjoying rehab - PFT show stability since May 2014  - continue cellcept and prednisone under direction of Dr Trudie Reed - use oxygen - goal pulse ox is > 88% - continue rehab  #Followup Office spirometry in 4 months (not June Leap) Office visit in 4 months with Dr Chase Caller

## 2013-12-23 ENCOUNTER — Other Ambulatory Visit: Payer: Self-pay | Admitting: Internal Medicine

## 2013-12-23 ENCOUNTER — Telehealth: Payer: Self-pay | Admitting: Internal Medicine

## 2013-12-23 MED ORDER — ALBUTEROL SULFATE HFA 108 (90 BASE) MCG/ACT IN AERS: 2.0000 | INHALATION_SPRAY | Freq: Four times a day (QID) | RESPIRATORY_TRACT | Status: DC | PRN

## 2013-12-23 NOTE — Telephone Encounter (Signed)
Called and spoke to pt's son. Son updated med list. Pt is taking Cellcept 540m tabs, 2 tabs in morning and 1 tab in evening. Pt is not taking imuran.   Pt's son wanted MR to be aware of the dosage of the cellcept. Will forward to MR to make aware.

## 2013-12-23 NOTE — Telephone Encounter (Signed)
Called and informed pt's son of recs per MR. He verbalized understanding and denied any further questions or concerns at this time.

## 2013-12-23 NOTE — Telephone Encounter (Signed)
Please let him know that is a good dosage and to stay on it. IS being effective.  Thanks  Dr. Brand Males, M.D., [facility].C.P Pulmonary and Critical Care Medicine Staff Physician [facility] Pager: [phone], If no answer or between  15:00h - 7:00h: call [phone]  12/23/2013 11:04 AM

## 2013-12-24 NOTE — Assessment & Plan Note (Signed)
#  Interstitial Lung Disease  - glad you are feeling better adn enjoying rehab - PFT show stability since May 2014  - continue cellcept and prednisone under direction of Dr Trudie Reed - use oxygen - goal pulse ox is > 88% - continue rehab  #Followup Office spirometry in 4 months (not June Leap) Office visit in 4 months with Dr Chase Caller  > 50% of this > 25 min visit spent in face to face counseling (15 min visit converted to 25 min)

## 2013-12-26 ENCOUNTER — Telehealth: Payer: Self-pay | Admitting: Internal Medicine

## 2013-12-26 MED ORDER — ALBUTEROL SULFATE HFA 108 (90 BASE) MCG/ACT IN AERS: 2.0000 | INHALATION_SPRAY | Freq: Four times a day (QID) | RESPIRATORY_TRACT | Status: DC | PRN

## 2013-12-26 NOTE — Telephone Encounter (Signed)
Pt's son is asking to have this re-sent asap. Asks to see if this can be verbally be called in d/t the disconnect w/ the Rx previously being sent.   Donna Watts

## 2013-12-26 NOTE — Telephone Encounter (Signed)
Refill for the ventolin has been sent to the pharmacy per pts request.  Called and spoke with pts son and he is aware.

## 2013-12-27 ENCOUNTER — Encounter (HOSPITAL_COMMUNITY)
Admission: RE | Admit: 2013-12-27 | Discharge: 2013-12-27 | Disposition: A | Discharge status: Home / Self Care | Payer: Self-pay | Source: Ambulatory Visit | Attending: Internal Medicine | Admitting: Internal Medicine

## 2013-12-27 DIAGNOSIS — J9610 Chronic respiratory failure, unspecified whether with hypoxia or hypercapnia: Secondary | ICD-10-CM | POA: Insufficient documentation

## 2013-12-27 DIAGNOSIS — Z5189 Encounter for other specified aftercare: Secondary | ICD-10-CM | POA: Insufficient documentation

## 2013-12-27 DIAGNOSIS — J8410 Pulmonary fibrosis, unspecified: Secondary | ICD-10-CM | POA: Insufficient documentation

## 2013-12-29 ENCOUNTER — Encounter (HOSPITAL_COMMUNITY)
Admission: RE | Admit: 2013-12-29 | Discharge: 2013-12-29 | Disposition: A | Discharge status: Home / Self Care | Payer: Self-pay | Source: Ambulatory Visit | Attending: Internal Medicine | Admitting: Internal Medicine

## 2014-01-03 ENCOUNTER — Encounter (HOSPITAL_COMMUNITY)
Admission: RE | Admit: 2014-01-03 | Discharge: 2014-01-03 | Disposition: A | Discharge status: Home / Self Care | Payer: Self-pay | Source: Ambulatory Visit | Attending: Internal Medicine | Admitting: Internal Medicine

## 2014-01-05 ENCOUNTER — Encounter (HOSPITAL_COMMUNITY)
Admission: RE | Admit: 2014-01-05 | Discharge: 2014-01-05 | Disposition: A | Discharge status: Home / Self Care | Payer: Self-pay | Source: Ambulatory Visit | Attending: Internal Medicine | Admitting: Internal Medicine

## 2014-01-10 ENCOUNTER — Encounter (HOSPITAL_COMMUNITY)
Admission: RE | Admit: 2014-01-10 | Discharge: 2014-01-10 | Disposition: A | Discharge status: Home / Self Care | Payer: Self-pay | Source: Ambulatory Visit | Attending: Internal Medicine | Admitting: Internal Medicine

## 2014-01-12 ENCOUNTER — Encounter (HOSPITAL_COMMUNITY): Payer: Self-pay

## 2014-01-17 ENCOUNTER — Encounter (HOSPITAL_COMMUNITY)
Admission: RE | Admit: 2014-01-17 | Discharge: 2014-01-17 | Disposition: A | Discharge status: Home / Self Care | Payer: Self-pay | Source: Ambulatory Visit | Attending: Internal Medicine | Admitting: Internal Medicine

## 2014-01-19 ENCOUNTER — Encounter (HOSPITAL_COMMUNITY)
Admission: RE | Admit: 2014-01-19 | Discharge: 2014-01-19 | Disposition: A | Discharge status: Home / Self Care | Payer: Self-pay | Source: Ambulatory Visit | Attending: Internal Medicine | Admitting: Internal Medicine

## 2014-01-24 ENCOUNTER — Encounter (HOSPITAL_COMMUNITY): Payer: Self-pay

## 2014-01-26 ENCOUNTER — Encounter (HOSPITAL_COMMUNITY): Payer: Self-pay

## 2014-01-26 DIAGNOSIS — J8410 Pulmonary fibrosis, unspecified: Secondary | ICD-10-CM | POA: Insufficient documentation

## 2014-01-31 ENCOUNTER — Encounter (HOSPITAL_COMMUNITY)
Admission: RE | Admit: 2014-01-31 | Discharge: 2014-01-31 | Disposition: A | Discharge status: Home / Self Care | Payer: Self-pay | Source: Ambulatory Visit | Attending: Internal Medicine | Admitting: Internal Medicine

## 2014-02-02 ENCOUNTER — Encounter (HOSPITAL_COMMUNITY)
Admission: RE | Admit: 2014-02-02 | Discharge: 2014-02-02 | Disposition: A | Discharge status: Home / Self Care | Payer: Self-pay | Source: Ambulatory Visit | Attending: Internal Medicine | Admitting: Internal Medicine

## 2014-02-07 ENCOUNTER — Encounter (HOSPITAL_COMMUNITY)
Admission: RE | Admit: 2014-02-07 | Discharge: 2014-02-07 | Disposition: A | Discharge status: Home / Self Care | Payer: Self-pay | Source: Ambulatory Visit | Attending: Internal Medicine | Admitting: Internal Medicine

## 2014-02-09 ENCOUNTER — Encounter (HOSPITAL_COMMUNITY): Payer: Self-pay

## 2014-02-16 ENCOUNTER — Encounter (HOSPITAL_COMMUNITY)
Admission: RE | Admit: 2014-02-16 | Discharge: 2014-02-16 | Disposition: A | Discharge status: Home / Self Care | Payer: Self-pay | Source: Ambulatory Visit | Attending: Internal Medicine | Admitting: Internal Medicine

## 2014-02-21 ENCOUNTER — Encounter (HOSPITAL_COMMUNITY): Payer: Self-pay | Attending: Internal Medicine

## 2014-02-21 DIAGNOSIS — J8410 Pulmonary fibrosis, unspecified: Secondary | ICD-10-CM | POA: Insufficient documentation

## 2014-02-23 ENCOUNTER — Encounter (HOSPITAL_COMMUNITY): Payer: Self-pay

## 2014-02-28 ENCOUNTER — Encounter (HOSPITAL_COMMUNITY)
Admission: RE | Admit: 2014-02-28 | Discharge: 2014-02-28 | Disposition: A | Discharge status: Home / Self Care | Payer: Self-pay | Source: Ambulatory Visit | Attending: Internal Medicine | Admitting: Internal Medicine

## 2014-02-28 DIAGNOSIS — J8410 Pulmonary fibrosis, unspecified: Secondary | ICD-10-CM | POA: Insufficient documentation

## 2014-03-02 ENCOUNTER — Encounter (HOSPITAL_COMMUNITY): Payer: Self-pay

## 2014-03-07 ENCOUNTER — Encounter (HOSPITAL_COMMUNITY)
Admission: RE | Admit: 2014-03-07 | Discharge: 2014-03-07 | Disposition: A | Discharge status: Home / Self Care | Payer: Self-pay | Source: Ambulatory Visit | Attending: Internal Medicine | Admitting: Internal Medicine

## 2014-03-09 ENCOUNTER — Encounter (HOSPITAL_COMMUNITY): Payer: Self-pay

## 2014-03-14 ENCOUNTER — Encounter (HOSPITAL_COMMUNITY)
Admission: RE | Admit: 2014-03-14 | Discharge: 2014-03-14 | Disposition: A | Discharge status: Home / Self Care | Payer: Self-pay | Source: Ambulatory Visit | Attending: Internal Medicine | Admitting: Internal Medicine

## 2014-03-16 ENCOUNTER — Encounter (HOSPITAL_COMMUNITY)
Admission: RE | Admit: 2014-03-16 | Discharge: 2014-03-16 | Disposition: A | Discharge status: Home / Self Care | Payer: Self-pay | Source: Ambulatory Visit | Attending: Internal Medicine | Admitting: Internal Medicine

## 2014-03-21 ENCOUNTER — Encounter (HOSPITAL_COMMUNITY)
Admission: RE | Admit: 2014-03-21 | Discharge: 2014-03-21 | Disposition: A | Discharge status: Home / Self Care | Payer: Self-pay | Source: Ambulatory Visit | Attending: Internal Medicine | Admitting: Internal Medicine

## 2014-03-23 ENCOUNTER — Encounter (HOSPITAL_COMMUNITY): Payer: Self-pay

## 2014-03-28 ENCOUNTER — Encounter (HOSPITAL_COMMUNITY)
Admission: RE | Admit: 2014-03-28 | Discharge: 2014-03-28 | Disposition: A | Discharge status: Home / Self Care | Payer: Self-pay | Source: Ambulatory Visit | Attending: Internal Medicine | Admitting: Internal Medicine

## 2014-03-28 DIAGNOSIS — J8410 Pulmonary fibrosis, unspecified: Secondary | ICD-10-CM | POA: Insufficient documentation

## 2014-03-30 ENCOUNTER — Encounter (HOSPITAL_COMMUNITY): Payer: Self-pay

## 2014-04-04 ENCOUNTER — Encounter (HOSPITAL_COMMUNITY)
Admission: RE | Admit: 2014-04-04 | Discharge: 2014-04-04 | Disposition: A | Discharge status: Home / Self Care | Payer: Self-pay | Source: Ambulatory Visit | Attending: Internal Medicine | Admitting: Internal Medicine

## 2014-04-05 ENCOUNTER — Other Ambulatory Visit: Payer: Self-pay | Admitting: Gastroenterology

## 2014-04-05 DIAGNOSIS — K625 Hemorrhage of anus and rectum: Secondary | ICD-10-CM

## 2014-04-06 ENCOUNTER — Encounter (HOSPITAL_COMMUNITY)
Admission: RE | Admit: 2014-04-06 | Discharge: 2014-04-06 | Disposition: A | Discharge status: Home / Self Care | Payer: Self-pay | Source: Ambulatory Visit | Attending: Internal Medicine | Admitting: Internal Medicine

## 2014-04-11 ENCOUNTER — Encounter (HOSPITAL_COMMUNITY)
Admission: RE | Admit: 2014-04-11 | Discharge: 2014-04-11 | Disposition: A | Discharge status: Home / Self Care | Payer: Self-pay | Source: Ambulatory Visit | Attending: Internal Medicine | Admitting: Internal Medicine

## 2014-04-13 ENCOUNTER — Other Ambulatory Visit: Payer: Medicare Other

## 2014-04-13 ENCOUNTER — Encounter (HOSPITAL_COMMUNITY)
Admission: RE | Admit: 2014-04-13 | Discharge: 2014-04-13 | Disposition: A | Discharge status: Home / Self Care | Payer: Self-pay | Source: Ambulatory Visit | Attending: Internal Medicine | Admitting: Internal Medicine

## 2014-04-17 ENCOUNTER — Other Ambulatory Visit: Payer: Medicare Other

## 2014-04-18 ENCOUNTER — Encounter (HOSPITAL_COMMUNITY): Payer: Self-pay

## 2014-04-20 ENCOUNTER — Encounter (HOSPITAL_COMMUNITY): Payer: Self-pay

## 2014-04-24 ENCOUNTER — Ambulatory Visit
Admission: RE | Admit: 2014-04-24 | Discharge: 2014-04-24 | Disposition: A | Discharge status: Home / Self Care | Payer: Medicare Other | Source: Ambulatory Visit | Attending: Gastroenterology | Admitting: Gastroenterology

## 2014-04-24 DIAGNOSIS — K625 Hemorrhage of anus and rectum: Secondary | ICD-10-CM

## 2014-04-25 ENCOUNTER — Encounter (HOSPITAL_COMMUNITY): Admission: RE | Admit: 2014-04-25 | Payer: Self-pay | Source: Ambulatory Visit

## 2014-04-27 ENCOUNTER — Encounter (HOSPITAL_COMMUNITY): Payer: Self-pay

## 2014-05-02 ENCOUNTER — Encounter (HOSPITAL_COMMUNITY)
Admission: RE | Admit: 2014-05-02 | Discharge: 2014-05-02 | Disposition: A | Discharge status: Home / Self Care | Payer: Self-pay | Source: Ambulatory Visit | Attending: Internal Medicine | Admitting: Internal Medicine

## 2014-05-02 DIAGNOSIS — J8410 Pulmonary fibrosis, unspecified: Secondary | ICD-10-CM | POA: Insufficient documentation

## 2014-05-04 ENCOUNTER — Encounter (HOSPITAL_COMMUNITY)
Admission: RE | Admit: 2014-05-04 | Discharge: 2014-05-04 | Disposition: A | Discharge status: Home / Self Care | Payer: Self-pay | Source: Ambulatory Visit | Attending: Internal Medicine | Admitting: Internal Medicine

## 2014-05-09 ENCOUNTER — Encounter (HOSPITAL_COMMUNITY)
Admission: RE | Admit: 2014-05-09 | Discharge: 2014-05-09 | Disposition: A | Discharge status: Home / Self Care | Payer: Self-pay | Source: Ambulatory Visit | Attending: Internal Medicine | Admitting: Internal Medicine

## 2014-05-11 ENCOUNTER — Encounter (HOSPITAL_COMMUNITY)
Admission: RE | Admit: 2014-05-11 | Discharge: 2014-05-11 | Disposition: A | Discharge status: Home / Self Care | Payer: Self-pay | Source: Ambulatory Visit | Attending: Internal Medicine | Admitting: Internal Medicine

## 2014-05-16 ENCOUNTER — Encounter (HOSPITAL_COMMUNITY): Payer: Self-pay

## 2014-05-18 ENCOUNTER — Encounter (HOSPITAL_COMMUNITY): Payer: Self-pay

## 2014-05-23 ENCOUNTER — Encounter (HOSPITAL_COMMUNITY): Payer: Self-pay

## 2014-05-25 ENCOUNTER — Encounter (HOSPITAL_COMMUNITY)
Admission: RE | Admit: 2014-05-25 | Discharge: 2014-05-25 | Disposition: A | Discharge status: Home / Self Care | Payer: Self-pay | Source: Ambulatory Visit | Attending: Internal Medicine | Admitting: Internal Medicine

## 2014-05-30 ENCOUNTER — Encounter (HOSPITAL_COMMUNITY): Payer: Medicare Other

## 2014-05-30 DIAGNOSIS — J8410 Pulmonary fibrosis, unspecified: Secondary | ICD-10-CM | POA: Insufficient documentation

## 2014-06-01 ENCOUNTER — Encounter (HOSPITAL_COMMUNITY): Payer: Self-pay

## 2014-06-06 ENCOUNTER — Telehealth: Payer: Self-pay | Admitting: Internal Medicine

## 2014-06-06 ENCOUNTER — Encounter (HOSPITAL_COMMUNITY)
Admission: RE | Admit: 2014-06-06 | Discharge: 2014-06-06 | Disposition: A | Discharge status: Home / Self Care | Payer: Self-pay | Source: Ambulatory Visit | Attending: Internal Medicine | Admitting: Internal Medicine

## 2014-06-06 MED ORDER — BUDESONIDE-FORMOTEROL FUMARATE 160-4.5 MCG/ACT IN AERO: INHALATION_SPRAY | RESPIRATORY_TRACT | Status: DC

## 2014-06-06 NOTE — Telephone Encounter (Signed)
Called spoke with daughter [name] and is aware RX sent in. Nothing further needed

## 2014-06-08 ENCOUNTER — Encounter (HOSPITAL_COMMUNITY)
Admission: RE | Admit: 2014-06-08 | Discharge: 2014-06-08 | Disposition: A | Discharge status: Home / Self Care | Payer: Self-pay | Source: Ambulatory Visit | Attending: Internal Medicine | Admitting: Internal Medicine

## 2014-06-13 ENCOUNTER — Encounter (HOSPITAL_COMMUNITY): Payer: Self-pay

## 2014-06-15 ENCOUNTER — Encounter (HOSPITAL_COMMUNITY)
Admission: RE | Admit: 2014-06-15 | Discharge: 2014-06-15 | Disposition: A | Discharge status: Home / Self Care | Payer: Self-pay | Source: Ambulatory Visit | Attending: Internal Medicine | Admitting: Internal Medicine

## 2014-06-20 ENCOUNTER — Encounter (HOSPITAL_COMMUNITY): Payer: Self-pay

## 2014-06-22 ENCOUNTER — Encounter (HOSPITAL_COMMUNITY): Payer: Self-pay

## 2014-06-27 ENCOUNTER — Encounter (HOSPITAL_COMMUNITY): Payer: Medicare Other

## 2014-06-27 DIAGNOSIS — J8410 Pulmonary fibrosis, unspecified: Secondary | ICD-10-CM | POA: Insufficient documentation

## 2014-06-29 ENCOUNTER — Encounter (HOSPITAL_COMMUNITY)
Admission: RE | Admit: 2014-06-29 | Discharge: 2014-06-29 | Disposition: A | Discharge status: Home / Self Care | Payer: Self-pay | Source: Ambulatory Visit | Attending: Internal Medicine | Admitting: Internal Medicine

## 2014-06-29 NOTE — Progress Notes (Signed)
Donna Watts arrived to pulmonary rehab maintenance not feeling well.  She has recently experienced an allergic reaction to a new medication for osteoporsis, patient and son unable to give exact medication name.  Today she desaturated to the mid 70's with walking and has much more congestion than normal.  I discussed this with patient's son, Donna Watts and he will call Dr. Golden Pop office for an appointment today.  I think she is developing a lung infection.  Her saturations would return to 93-96% with rest.  She only exercised 20 minutes.

## 2014-07-03 ENCOUNTER — Ambulatory Visit: Payer: Medicare Other | Admitting: Internal Medicine

## 2014-07-04 ENCOUNTER — Encounter (HOSPITAL_COMMUNITY): Payer: Self-pay

## 2014-07-06 ENCOUNTER — Encounter (HOSPITAL_COMMUNITY): Payer: Self-pay

## 2014-07-07 ENCOUNTER — Encounter: Payer: Self-pay | Admitting: Adult Health

## 2014-07-07 ENCOUNTER — Ambulatory Visit (INDEPENDENT_AMBULATORY_CARE_PROVIDER_SITE_OTHER): Payer: Medicare Other | Admitting: Adult Health

## 2014-07-07 ENCOUNTER — Ambulatory Visit (INDEPENDENT_AMBULATORY_CARE_PROVIDER_SITE_OTHER)
Admission: RE | Admit: 2014-07-07 | Discharge: 2014-07-07 | Disposition: A | Discharge status: Home / Self Care | Payer: Medicare Other | Source: Ambulatory Visit | Attending: Adult Health | Admitting: Adult Health

## 2014-07-07 ENCOUNTER — Telehealth: Payer: Self-pay | Admitting: Adult Health

## 2014-07-07 VITALS — BP 138/70 | HR 99 | Temp 97.5°F | Ht 59.0 in | Wt 120.2 lb

## 2014-07-07 DIAGNOSIS — J8410 Pulmonary fibrosis, unspecified: Secondary | ICD-10-CM

## 2014-07-07 MED ORDER — PREDNISONE 10 MG PO TABS: ORAL_TABLET | ORAL | Status: DC

## 2014-07-07 MED ORDER — DOXYCYCLINE HYCLATE 100 MG PO TABS: 100.0000 mg | ORAL_TABLET | Freq: Two times a day (BID) | ORAL | Status: DC

## 2014-07-07 NOTE — Assessment & Plan Note (Signed)
Flare with suspected URI   Plan  Chest xray today .  Increase Prednisone 64m daily for 3 days , 35mdaily for 3 days , then 206maily for 3 days then 63m24mily for 3 days then back to 8 mg daily .  Mucinex DM Twice daily  As needed  Cough/congestion  Doxycycline 100mg63mce daily  For 7 days -take with food  Please contact office for sooner follow up if symptoms do not improve or worsen or seek emergency care  follow up Dr. RamasChase Caller weeks and As needed

## 2014-07-07 NOTE — Progress Notes (Signed)
Subjective:    Patient ID: Donna Watts, female    DOB: 19-Feb-1930, 81 y.o.   MRN: [id number]  HPI  HPI  PCP FULP, CAMMIE, MD Rheum: Dr Gavin Pound  HPI  #RA-ILD/UIP pattern  -  h/o cough onset around 2005 with a cxr showing scarring per Dr Inda Merlin office records c/w PF -  - Diagnosed RA with arthritis and ? EN summer 2014  - First CT chest June 2010: R> L UIP pattern  -Followup CT chest June 2014 - R > L UIP pattern much worse   - - PFT's November 09, 2008 VC 2.03 ( 92%) DLC0 52%  - PFT's October 25, 2009 VC 2.0 (92%)   DLC0 58%  -PFT's  09/25/10            VC1.88 (86%)  DLCO 54 - Ex desat documented 11/09/08 x 1 lap > 84% p 2 laps October 25, 2009 > 84% p one lap 08/27/2010     > 87 p 2 laps RA 09/25/2010 > 83% p 2 laps at a very rapid pace 12/02/10 rec pace or wear 02, wants to try pacing - 07/04/2011   Walked RA x one lap @ 185 stopped due to  sats 87% at nl pace > 2lpm - 08/29/2011   Walked RA x one lap @ 185 stopped due to  desat to 88, improved but not eliminated on 2lpm so rec use 3lpm with ex - - PFT May 2014: FVC 1.3L/67%, DLCO 6.4/36%   - > started on CellCept in March 2015. [name] May 2015: FVC of 1.34/71% - PFT August 2015: VC 1.3. The/69%. FEV1 1.2 L/87%. Ratio of 92/125%. DLCO of 6.4/36%.    - Rx   - PPI since 2010  - Immuran/Pred since 2014   - O2 dependent x ? 2012     - Jan 2015: 3L at rest, 5L with exertion, 8L uphill   - started on CellCept in March 2015.    .................................................. OV 09/12/2013  Chief Complaint  Patient presents with  . Follow-up    Pt states her breathing has improved. C/o DOE, intermittent productive cough with white mucous. Denies CP.    Followup interstitial lung disease UIP pattern secondary to rheumatoid arthritis  Since last visit and her 2015 she has seen Dr. Gavin Pound of rheumatology. She was started on CellCept in March 2015. According to her and her son the higher doses causes side effects. Therefore she  is back down 500 milligrams twice a day. She says she is tolerating this well. She is attending pulmonary rehabilitation. Overall she's feeling much better than her previous baseline with reduced dyspnea, increased quality of life and increase physical conditioning. Apparently pulmonary rehabilitation helps her a lot. She was supposed to have pulmonary function test today but she did not fall unclear reasons perhaps due to scheduling. She continues to use oxygen 3 L at night at rest. At daytime she uses 6 L. And even with a 6 L when she overexerts herself evidence of the house she is transient desaturation to the 70s but this is baseline  SPirometry in office today shows FVC of 1.34/71%. FEV1 was 1.06 L or 76%. Ratio 79/104%. Stable since May 2014   OV 12/22/2013  Chief Complaint  Patient presents with  . Follow-up    Pt with PFT results. Pt states her breathing is unchanged since last OV. Pt c/o DOE. Pt denies cough and Cp/tightness.    Followup interstitial lung disease pulmonary  fibrosis UIP pattern secondary to rheumatoid arthritis  She is here with her son Donna Watts. She reports overall stability in the sense of well-being. She takes CellCept 1027m in day, 5057mat night (son verified this the following day). She did not bring a medication list with her today. She continues to attend pulmonary rehabilitation. She feels that this helps the with her dyspnea, quality of life and physical conditioning. Son notices that now at rest that she even manages well with 1 L oxygen and they think that she can get by with 2 L of oxygen at rest at night. In the daytime and apparently at rehabilitation with 6 L she does not desaturate. Pulmonary function test that shows continued stability of her lung function since May of 2014 and especially since starting CellCept in the spring of this year 2015. At this point in time she does not have any problems tolerating the CellCept   Pulmonary function test August 27 today  shows FVC 1.3. The/69%. FEV1 1.2 L/87%. Ratio of 92/125%. DLCO of 6.4/36%. The numbers are stable since May 2014.   07/07/2014 Acute OV ILD /RA on cellcept/steroids  Presents with son for an acute office visit.  Complains of 2 weeks of increased DOE. O2 sats running low at pulm rehab.  Complains of  nasal congestion, increased SOB, wheezing and chest tightnes, and productive cough with white mucus. Denies f/c/s, n/v, hemoptysis, edema .  More winded than her usual. Wears out easily No chest pain,hemoptysis  orthopnea, edema or fever.  No otc used.  Remains on pred 29m59maily      Review of Systems  Constitutional: Negative for fever and unexpected weight change.  HENT: Negative for congestion, dental problem, ear pain, nosebleeds, postnasal drip, rhinorrhea, sinus pressure, sneezing, sore throat and trouble swallowing.   Eyes: Negative for redness and itching.  Respiratory: Positive for cough and shortness of breath. Negative for chest tightness and wheezing.   Cardiovascular: Negative for palpitations and leg swelling.  Gastrointestinal: Negative for nausea and vomiting.  Genitourinary: Negative for dysuria.  Musculoskeletal: Negative for joint swelling.  Skin: Negative for rash.  Neurological: Negative for headaches.  Hematological: Does not bruise/bleed easily.  Psychiatric/Behavioral: Negative for dysphoric mood. The patient is not nervous/anxious.       Objective:   Physical Exam    itals reviewed. Constitutional: She is oriented to person, place, and time. She appears well-developed and well-nourished. No distress.  Pleasant lady  HENT:  Head: Normocephalic and atraumatic.  Right Ear: External ear normal.  Left Ear: External ear normal.  Mouth/Throat: Oropharynx is clear and moist. No oropharyngeal exudate.  Eyes: Conjunctivae and EOM are normal. Pupils are equal, round, and reactive to light. Right eye exhibits no discharge. Left eye exhibits no discharge. No scleral  icterus.  Neck: Normal range of motion. Neck supple. No JVD present. No tracheal deviation present. No thyromegaly present.  Cardiovascular: Normal rate, regular rhythm, normal heart sounds and intact distal pulses.  Exam reveals no gallop and no friction rub.   No murmur heard. Pulmonary/Chest: Effort normal and breath sounds normal. No respiratory distress. She has no wheezes.  She exhibits no tenderness. Significant bibasal rales - half way up Abdominal: Soft. Bowel sounds are normal. She exhibits no distension and no mass. There is no tenderness. There is no rebound and no guarding.  Musculoskeletal: Normal range of motion. She exhibits no edema and no tenderness.  Lymphadenopathy:    She has no cervical adenopathy.  Neurological: She is  alert and oriented to person, place, and time. She has normal reflexes. No cranial nerve deficit. She exhibits normal muscle tone. Coordination normal.  Skin: Skin is warm and dry. No rash noted. She is not diaphoretic. No erythema. No pallor.  Psychiatric: She has a normal mood and affect. Her behavior is normal. Judgment and thought content normal.          Assessment & Plan:

## 2014-07-07 NOTE — Patient Instructions (Signed)
Chest xray today .  Increase Prednisone 32m daily for 3 days , 39mdaily for 3 days , then 2028maily for 3 days then 68m30mily for 3 days then back to 8 mg daily .  Mucinex DM Twice daily  As needed  Cough/congestion  Doxycycline 100mg27mce daily  For 7 days -take with food  Please contact office for sooner follow up if symptoms do not improve or worsen or seek emergency care  follow up Dr. RamasChase Caller weeks and As needed

## 2014-07-07 NOTE — Telephone Encounter (Signed)
Patient was unsure how to take her Prednisone.  I spoke with patient and told her day by day how to take the prednisone.  She verbalized understanding.  Nothing further needed.

## 2014-07-12 DIAGNOSIS — J8410 Pulmonary fibrosis, unspecified: Secondary | ICD-10-CM | POA: Diagnosis not present

## 2014-07-12 MED ORDER — LEVALBUTEROL HCL 0.63 MG/3ML IN NEBU
0.6300 mg | INHALATION_SOLUTION | Freq: Once | RESPIRATORY_TRACT | Status: AC
  Administered 2014-07-12: 0.63 mg via RESPIRATORY_TRACT

## 2014-07-12 NOTE — Addendum Note (Signed)
Addended by: Parke Poisson E on: 07/12/2014 01:29 PM   Modules accepted: Orders

## 2014-07-17 NOTE — Progress Notes (Signed)
Quick Note:  Pt's son returned call. Informed son of the results and recs per TP. Pt verbalized understanding and denied any further questions or concerns at this time.   ______

## 2014-08-21 ENCOUNTER — Ambulatory Visit: Payer: Medicare Other | Admitting: Internal Medicine

## 2014-10-03 ENCOUNTER — Ambulatory Visit: Payer: Medicare Other | Attending: Internal Medicine

## 2014-10-03 DIAGNOSIS — Z7409 Other reduced mobility: Secondary | ICD-10-CM

## 2014-10-03 DIAGNOSIS — R269 Unspecified abnormalities of gait and mobility: Secondary | ICD-10-CM | POA: Insufficient documentation

## 2014-10-03 DIAGNOSIS — M810 Age-related osteoporosis without current pathological fracture: Secondary | ICD-10-CM | POA: Insufficient documentation

## 2014-10-03 NOTE — Therapy (Addendum)
[facility] [address] [place], [place], [address] Phone: [phone]   Fax:  [phone]  Physical Therapy Evaluation  Patient Details  Name: Donna Watts MRN: [id number] Date of Birth: Apr 08, 1930 Referring Provider:  Delrae Rend, MD  Encounter Date: 10/03/2014      PT End of Session - 10/03/14 1536    Visit Number 1   Number of Visits 10   Date for PT Re-Evaluation 11/28/14   PT Start Time 0245   PT Stop Time 0323   PT Time Calculation (min) 38 min   Equipment Utilized During Treatment Back brace;Oxygen   Activity Tolerance Patient tolerated treatment well   Behavior During Therapy [facility] for tasks assessed/performed      Past Medical History  Diagnosis Date  . Other and unspecified hyperlipidemia   . Esophageal reflux   . Pulmonary fibrosis   . Diabetes mellitus without complication   . Acid reflux   . Rheumatoid arthritis   . Cancer     Past Surgical History  Procedure Laterality Date  . Appendectomy      There were no vitals filed for this visit.  Visit Diagnosis:  Osteoporosis - Plan: PT plan of care cert/re-cert  Abnormality of gait - Plan: PT plan of care cert/re-cert  Decreased mobility and endurance - Plan: PT plan of care cert/re-cert      Subjective Assessment - 10/03/14 1524    Subjective Pt has had one previous fall in the past month when she was outside and not paying attention. Uses a Rollator walker when outside, [facility] when in the house. Describes discomfort with TLSO brace that she has discussed with MD previously.    Pertinent History Fall 1 month ago    How long can you walk comfortably? About 15 minutes due to decreased endurance    Patient Stated Goals Get stronger in her legs    Currently in Pain? No/denies            [facility] PT Assessment - 10/03/14 0001    Assessment   Medical Diagnosis M81.0 - Osteoporosis; R29.6 - Falls Frequently    Onset Date/Surgical Date  09/02/14   Next MD Visit August    Prior Therapy Pulmonary rehab   Precautions   Precautions Fall;Other (comment)  Oxygen: 6 L currently; 8 L with ambulation, 4 L for sleep    Precaution Comments T score: -3.6, hx of CA   Required Braces or Orthoses Spinal Brace   Spinal Brace Thoracolumbosacral orthotic   Restrictions   Weight Bearing Restrictions No   Balance Screen   Has the patient fallen in the past 6 months Yes  Outside   How many times? 1  2x in the past month per doctor's report   Has the patient had a decrease in activity level because of a fear of falling?  Yes   Is the patient reluctant to leave their home because of a fear of falling?  No   Home Social worker Private residence   Living Arrangements Children  Son   Type of [place] to enter  [place], usually with son    Entrance [place] - 4 wheels;Cane - single point;Shower seat;Grab bars - tub/shower;Grab bars - toilet  Rollator walker for outside use    Prior Function   Level of Independence Independent with basic ADLs   Vocation Retired   Leisure Reading,  knitting, crochet, puzzle   Cognition   Overall Cognitive Status Within Functional Limits for tasks assessed   Posture/Postural Control   Posture/Postural Control Postural limitations   Postural Limitations Increased thoracic kyphosis   ROM / Strength   AROM / PROM / Strength Strength;AROM   AROM   Overall AROM  Within functional limits for tasks performed  Full ROM for UE/LE motions    Strength   Overall Strength Deficits   Overall Strength Comments Bil. shoulder flexion/abd: 4/5; all other motions 5/5. Bil hip  strength: 3+/5; Lt. knee extension: 4+/5; all other LE strength 5/5    Standardized Balance Assessment   Standardized Balance Assessment Five Times Sit to Stand   Five times sit to stand comments  27 seconds; required UE after 3rd sit to stand                             PT Education - 10/03/14 1442    Education provided Yes   Education Details osteo dos and donts, osteo info   Person(s) Educated Patient   Methods Explanation;Handout   Comprehension Verbalized understanding          PT Short Term Goals - 10/03/14 1549    PT SHORT TERM GOAL #1   Title Initiate HEP focusing on managing osteoporosis postural deficits and strength deficits    Time 4   Period Weeks   Status New   PT SHORT TERM GOAL #2   Title Will verbally understand correct body mechanics for home and work tasks to decrease strain on spine.    Time 4   Period Weeks   Status New   PT SHORT TERM GOAL #3   Title Will decrease time for 5x sit to stand to < or = 23 seconds without use of UE    Time 4   Period Weeks   Status New           PT Long Term Goals - 10/03/14 1551    PT LONG TERM GOAL #1   Title Will verbally understand do's and don'ts of osteoporosis management    Time 8   Period Weeks   Status New   PT LONG TERM GOAL #2   Title Will verbally understand ways to manage osteoporosis with diet and reduce strain postures on the spine    Time 8   Period Weeks   Status New   PT LONG TERM GOAL #3   Title Will verbally understand how to strengthen postural musculature    Time 8   Period Weeks   Status New   PT LONG TERM GOAL #4   Title Improve 5x sit to stand score to = or  < 15 seconds to decrease falls risk    Time 8   Period Weeks   Status New               Plan - 10/03/14 1539    Clinical Impression Statement 81 y.o presents with T score of -3.6 and history of frequent falls. Pt also presents with decreased LE functional strength and has increased risks for falls and potential fracture. Presented information about how to improve bone density and falls prevention information; pt verbalized understanding. Pt is currently wearing a TLSO to prevent flexion/rotation spinal movements. Pt will benfeit from skilled PT  for resistive/strength training, increased endurance, balance training and education to reduce falls risk and improve ability to perform ADLs.  Pt will benefit from skilled therapeutic intervention in order to improve on the following deficits Abnormal gait;Difficulty walking;Decreased endurance;Cardiopulmonary status limiting activity;Decreased activity tolerance;Decreased balance;Decreased strength   Rehab Potential Good   PT Frequency 2x / week   PT Duration 8 weeks   PT Treatment/Interventions ADLs/Self Care Home Management;Energy conservation;Therapeutic exercise;Balance training;Moist Heat;Therapeutic activities;Neuromuscular re-education;Functional mobility training;Stair training;Gait training;Passive range of motion;Patient/family education;Orthotic Fit/Training;Manual techniques   PT Next Visit Plan Begin with bike for endurance training, review education about what movements to avoid, begin LE strength/balance exercises    Consulted and Agree with Plan of Care Patient          G-Codes - 10/04/14 1559    Functional Assessment Tool Used 5x sit to stand test & clinical judgement   Functional Limitation Other PT primary   Other PT Primary Current Status (M1470) At least 40 percent but less than 60 percent impaired, limited or restricted   Other PT Primary Goal Status (L2957) At least 20 percent but less than 40 percent impaired, limited or restricted       Problem List Patient Active Problem List   Diagnosis Date Noted  . Dry eye 08/29/2013  . Chemosis 08/29/2013  . 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 Meibomian gland disease 07/07/2013  . Deformity of eyelid 07/07/2013  . Arthritis or polyarthritis, rheumatoid 07/07/2013  . Scar of eyelid 07/07/2013  . Diabetes 09/23/2012  . DYSPNEA 09/23/2012  . Hypoxemia 09/23/2012  . Awaiting organ transplant 09/23/2012  . Chronic respiratory failure 07/07/2011  . GERD 09/06/2009  . PULMONARY FIBROSIS 10/12/2008  . Cough  10/12/2008    Reginal Lutes, SPT 04-Oct-2014 4:35 PM  [facility] [address], [place], [place], [address] Phone: [phone]   Fax:  [phone]

## 2014-10-03 NOTE — Patient Instructions (Signed)
DO's and DON'T's   Avoid and/or Minimize positions of forward bending ( flexion)  Side bending and rotation of the trunk  Especially when movements occur together   When your back aches:   Don't sit down   Lie down on your back with a small pillow under your head and one under your knees or as outlined by our therapist. Or, lie in the 90/90 position ( on the floor with your feet and legs on the sofa with knees and hips bent to 90 degrees)  Tying or putting on your shoes:   Don't bend over to tie your shoes or put on socks.  Instead, bring one foot up, cross it over the opposite knee and bend forward (hinge) at the hips to so the task.  Keep your back straight.  If you cannot do this safely, then you need to use long handled assistive devices such as a shoehorn and sock puller.  Exercising:  Don't engage in ballistic types of exercise routines such as high-impact aerobics or jumping rope  Don't do exercises in the gym that bring you forward (abdominal crunches, sit-ups, touching your  toes, knee-to-chest, straight leg raising.)  Follow a regular exercise program that includes a variety of different weight-bearing activities, such as low-impact aerobics, T' ai chi or walking as your physical therapist advises  Do exercises that emphasize return to normal body alignment and strengthening of the muscles that keep your back straight, as outlined in this program or by your therapist  Household tasks:  Don't reach unnecessarily or twist your trunk when mopping, sweeping, vacuuming, raking, making beds, weeding gardens, getting objects ou of cupboards, etc.  Keep your broom, mop, vacuum, or rake close to you and mover your whole body as you move them. Walk over to the area on which you are working. Arrange kitchen, bathroom, and bedroom shelves so that frequently used items may be reached without excessive bending, twisting, and reaching.  Use a  sturdy stool if necessary.  Don't bend from the waist to pick up something up  Off the floor, out of the trunk of your car, or to brush your teeth, wash your face, etc.   Bend at the knees, keeping back straight as possible. Use a reacher if necessary.   Prevention of fracture is the so-called "BOTTOm -Line" in the management of OSTEOPOROSIS. Do not take unnecessary chances in movement. Once a compression fracture occurs, the process is very difficult to control; one fracture is frequently followed by many more.     Osteoporosis   What is Osteoporosis?  - A silent disease in which the skeleton is weakened by decreased bone density. - Characterized by low bone mass, deterioration of bone, and increased risk of fracture postmenopausal (primary) or the result of an identifiable condition/event (secondary) - Commonly found in the wrists, spine, and hips; these are high-risk stress areas and very susceptible to fractures.  The Facts: - There are 1.5 million fractures/year o 500,000 spine; 250,000 hip with over 60,000 nursing home admissions secondary to hip fracture; and 200,000 wrist - After hip fracture, only 50% of people able to walk independently prior to the fracture return to independent ambulation. - Bone mass: Peaks at age 78-30, and begins declining at age 60-50.   Osteoporosis is defined by the [facility]) as:  NOF/WHO Criteria for Interpreting Results of Bone Density Assessment  Results Diagnosis  Within 1 standard deviation (SD) of young adult mean Normal  Between 1 and -2.5  SD below mean, repeat in 2 years Low bone mass (osteopenia)  Greater than -2.5 SD below mean Osteoporosis  Greater than -2.5 SD below mean and one or more fragility fractures exist Severe Osteoporosis  *Results can be affected by positioning of the body in the DEXA scan, presence of current or old fractures, arthritis, extraneous calcifications.    Osteoporosis is not just a  women's disease!  - 30-40% of women will develop osteoporosis - 5-15% of males will develop osteoporosis   What are the risk factors?  1. Female 2. Thin, small frame 3. Caucasian, Asian race 4. Early menopause (<56 years old)/amenorrhea/delayed puberty 69. Old age 48. Family history (fractures, stooped posture)\ 7. Low calcium diet 8. Sedentary lifestyle 9. Alcohol, Caffeine, Smoking 10. Malnutrition, GI Disease 11. Prolonged use of Glucocorticoids (Prednisone), Meds to treat asthma, arthritis, cancers, thyroid, and anti-seizure meds.  How do I know for sure?  Get a BONE DENSITY TEST!  This measures bone loss and it's painless, non-invasive, and only takes 5-10 minutes!  What can I do about it?  ? Decrease your risk factors (alcohol, caffeine, smoking) ? Helpful medications (see next page) ? Adequate Calcium and Vitamin D intake ? Get active! o Proper posture - Sit and stand tall! No slouching or twisting o Weight-Bearing Exercise - walking, stair climbing, elliptical; NO jogging or high-impact exercise. o Resistive Exercise - Cybex weight equipment, Nautilus, dumbbells, therabands  **Be sure to maintain proper alignment when lifting any weight!!  **When using equipment, avoid abdominal exercises which involve "crunching" or curling or twisting the trunk, biceps machines, cross-country machines, moving handlebars, or ANY MACHINE WITH ROTATION OR FORWARD BENDING!!!           Approved Pharmacologic Management of Osteoporosis  Agent Approved for prevention Approved for treatment BMD increased spine/hip Fracture reduction  Estrogen/Hormone Therapy (Estrace, Estratab, Ogen, Premarin, Vivell, Prempro, Femhert, Orthoest) Yes Yes 3-6% 35% spine and hip  Bisphosphonates  (Fosamax, Actonel, Boniva) Yes Yes 3-8% 35-50% spine and non-spine  Calcitonin (Miacalcin, Calcimar, Fortical) No Yes 0-3% None stated  Raloxifene (Evista) Yes Yes 2-3% 30-55%  Parathyroid  Hormone (Forteo) No Yes, only in those at high risk for fracture None stated 53-65%     Recommended Daily Calcium Intakes   Population Group NIH/NOF* (mg elemental calcium)  Children 1-10 years [phone]  Children 11-24 years 38-1500  Men and Women 25-64 years At least 1200  Pregnant/Lactating At least 1200  Postmenopausal women with hormone replacement therapy At least 1200  Postmenopausal women without   hormone replacement therapy At least 1200  Men and women 65 + At least 1200  *In 1987, 1990, 1994, and 2000, the NIH held consensus conferences on osteoporosis and calcium.  This column shows the most recent recommendations regarding calcium intak for preventing and managing osteoporosis.          Calcium Content of Selected Foods  Dairy Foods Calcium Content (mg) Non-Dairy Foods Calcium Content (mg)  Buttermilk, 1 cup 300 Calcium-fortified juice, 1 cup 300  Milk, 1 cup 300 Salmon, canned with Bones, 2 oz 100  Lactaid milk, 1 cup 300-500 Oysters, raw 13-19 medium 226  Soy milk, 1 cup 200-300 Sardines, canned with bones, 3 oz 372  Yogurt (plain, lowfat) 1 cup 250-300 Shrimp, canned 3 oz 98  Frozen yogurt (fruit) 1 cup 200-600 Collard greens, cooked 1 cup 357  Cheddar, mozzarella, or Mu[place]ster cheese, 1oz 205 Broccoli, cooked 1 cup 78  Cottage cheese (lowfat) 4 oz 200 Soybeans, cooked 1  cup 131  Part-skim ricotta cheese, 4oz 335 Tofu, 4oz* *  Vanilla ice cream, 1 cup 120-300    *Calcium content of tofu varies depending on processing method; check nutritional label on package for precise calcium content.     Suggested Guidelines for Calcium Supplement Use:  ? Calcium is absorbed most efficiently if taken in small amounts throughout the day.  Always divide the daily dose into smaller amounts if the total daily dose is 565m or more per day.  The body cannot use more than 5053mCalcium at any one time. ? The use of manufactured supplements is encouraged.   Calcium as bone meal or dolomite may contain lead or other heavy metals as contaminants. ? Calcium supplements should not be taken with high fiber meals or with bulk forming laxatives. ? If calcium carbonate is used as the supplement form, it should be taken with meals to assure that stomach acid production is present to facilitate optimal dissolution and absorption of calcium.  This is important if atrophic gastritis with hypo- or achlorhydria is present, which it is in 20-50% of older individuals. ? It is important to drink plenty of fluids while using the supplement to help reduce problems with side effects like constipation or bloating.  If these symptoms become a problem, switching to another form of supplement may be the answer. ? Another alternative is calcium-fortified foods, including fruit juices, cereals, and breads.  These foods are now marketed with added calcium and may be less likely to cause side effects. ? Those with personal or family histories of kidney stones should be monitored to assure that hypercalcuria does not occur. CALCIUM INTAKE QUIZ  Dairy products are the primary source of calcium for most people.  For a quick estimate of your daily calcium intake, complete the following steps:  1. Use the chart below to determine your daily intake of calcium from diary foods. Servings of dairy per day _0 Milligrams (mg) of calcium: 250 [phone] 1250 1500 1750 2000   2.  Enter your total daily calcium intake from dairy foods:     _____mg  3.  Add 350 mg, which is the average for all other dietary sources:                 +            350 mg  4.  The sum of your total daily calcium intake:               ______mg  5.  Enter the recommended calcium intake for your age from the chart below;         ______mg  6.  Enter your daily intake from step 4 above and subtract:                             -        _______mg  7.  The result is how much additional calcium you  need:                                          ______mg      Recommended Daily Calcium Intake  Population Calcium (mg)  Children 1-10 years [phone]  Children 11-24 years 1263-1500Men and women 25-64 At  least 1200  Pregnant/Lactating At least 1200  Postmenopausal women with hormone replacement therapy At least 1200  Postmenopausal women without hormone replacement therapy At least 1200  Men and women 65+ At least [place]   1. Remove throw rugs and make certain carpet edges are securely fastened to the floor.  2. Reduce clutter, especially in traffic areas of the home.   3. Install/maintain sturdy handrails at stairs.  4. Increase wattage of lighting in hallways, bathrooms, kitchens, stairwells, and entrances to home.  5. Use night-lights near bed, in hallways, and in bathroom to improve night safety.  6. Install safety handrails in shower, tub, and around toilet.  Bathtubs and shower stalls should have non-skid surfaces.  7. When you must reach for something high, use a safety step stool, one with wide steps and a friction surface to stand on.  A type equipped with a high handrail is preferred.  8. If a cane or other walking aid has been recommended, use it to help increase your stability.  9. Wear supportive, cushioned, low-heeled shoes.  Avoid "scuffs" (backless bedroom slippers) and high heels.  10. Avoid rushing to answer a phone, doorbell, or anything else!  A portable phone that you can take from room to room with you is a good idea for security and safety.  11. Exercise regularly and stay active!!    [place] www.NOF.org   Exercise for Osteoporosis; A Safe and Effective Way to Build Bone Density and Muscle Strength By: Burnard Hawthorne, M.A.

## 2014-10-05 ENCOUNTER — Ambulatory Visit: Payer: Medicare Other | Admitting: Physical Therapy

## 2014-10-05 DIAGNOSIS — Z7409 Other reduced mobility: Secondary | ICD-10-CM

## 2014-10-05 DIAGNOSIS — M810 Age-related osteoporosis without current pathological fracture: Secondary | ICD-10-CM

## 2014-10-05 DIAGNOSIS — R269 Unspecified abnormalities of gait and mobility: Secondary | ICD-10-CM | POA: Diagnosis not present

## 2014-10-05 NOTE — Patient Instructions (Addendum)
   BACK: Hip Flexor Stretch   Stand with right knee on a chair. Place left leg ahead of right on the floor (standing).  Shift weight forward. Hold position for _30__ seconds. Repeat on other leg. Do 3 times each side. Do _2 times per day.  Fax [phone] FLEXION: Sitting (Active)   Sit, both feet flat. Lift right knee toward ceiling. Use _0-2__ lbs. Complete _3__ sets of __10 repetitions. Perform _1-2__ sessions per day.  Copyright  VHI. All rights reserved.    FLEXION: Standing - Stable (Active)   Stand, both feet flat at counter. Bend right knee, bringing heel toward buttocks. Use _0__ lbs. Complete _3__ sets of _10__ repetitions. Perform _1-2__ sessions per day.  http://gtsc.exer.us/240   Copyright  VHI. All rights reserved.     Madelyn Flavors, PT 10/05/2014 10:13 AM [facility] [address] [place], [place] [address] Phone # [phone]

## 2014-10-05 NOTE — Therapy (Signed)
[facility] [address] [place], [place], [address] Phone: [phone]   Fax:  [phone]  Physical Therapy Treatment  Patient Details  Name: Donna Watts MRN: [id number] Date of Birth: 1929/11/26 Referring Provider:  Antony Blackbird, MD  Encounter Date: 10/05/2014      PT End of Session - 10/05/14 0932    Visit Number 2   Number of Visits 10   Date for PT Re-Evaluation 11/28/14   PT Start Time 0931   PT Stop Time 1017   PT Time Calculation (min) 46 min   Activity Tolerance Patient tolerated treatment well      Past Medical History  Diagnosis Date  . Other and unspecified hyperlipidemia   . Esophageal reflux   . Pulmonary fibrosis   . Diabetes mellitus without complication   . Acid reflux   . Rheumatoid arthritis   . Cancer     Past Surgical History  Procedure Laterality Date  . Appendectomy      There were no vitals filed for this visit.  Visit Diagnosis:  Osteoporosis  Decreased mobility and endurance      Subjective Assessment - 10/05/14 0932    Subjective No new complaints.   Currently in Pain? No/denies                         [facility] Adult PT Treatment/Exercise - 10/05/14 0001    Exercises   Exercises Knee/Hip   Knee/Hip Exercises: Stretches   Hip Flexor Stretch 3 reps;30 seconds  bil; with knee on chair; alt leg standng (mod lunge)   Knee/Hip Exercises: Aerobic   Stationary Bike L1 x 5 min with 20 second rest x 3  rests due to LLE fatigue; SPO2 92% after/98% x 4mn   Knee/Hip Exercises: Standing   Heel Raises 2 sets;10 reps  straight and out   Knee Flexion Strengthening;Both;2 sets;10 reps  TCs against thigh to avoid hip flexion   Functional Squat 10 reps  sit to stand   Functional Squat Limitations must use hands; O2 91%; 98% with 1.5 min rest   Knee/Hip Exercises: Seated   Long Arc Quad Strengthening;Both;2 sets;10 reps  5 sec hold   Long Arc Quad Weight 2  lbs.   Other Seated Knee Exercises marching 2x 10   1 set 0 wt; 1 set with 2#   Other Seated Knee Exercises clam with yellow 1x10; red 2x10                PT Education - 10/05/14 1135    Education provided Yes   Education Details reviewed precautions; seated marching, stdg HS curls and stdg hip flexor stretch   Person(s) Educated Patient   Methods Explanation;Demonstration;Handout   Comprehension Verbalized understanding;Returned demonstration          PT Short Term Goals - 10/03/14 1549    PT SHORT TERM GOAL #1   Title Initiate HEP focusing on managing osteoporosis postural deficits and strength deficits    Time 4   Period Weeks   Status New   PT SHORT TERM GOAL #2   Title Will verbally understand correct body mechanics for home and work tasks to decrease strain on spine.    Time 4   Period Weeks   Status New   PT SHORT TERM GOAL #3   Title Will decrease time for 5x sit to stand to < or = 23 seconds without use of UE  Time 4   Period Weeks   Status New           PT Long Term Goals - 10/03/14 1551    PT LONG TERM GOAL #1   Title Will verbally understand do's and don'ts of osteoporosis management    Time 8   Period Weeks   Status New   PT LONG TERM GOAL #2   Title Will verbally understand ways to manage osteoporosis with diet and reduce strain postures on the spine    Time 8   Period Weeks   Status New   PT LONG TERM GOAL #3   Title Will verbally understand how to strengthen postural musculature    Time 8   Period Weeks   Status New   PT LONG TERM GOAL #4   Title Improve 5x sit to stand score to = or  < 15 seconds to decrease falls risk    Time 8   Period Weeks   Status New               Plan - 10/05/14 1144    Clinical Impression Statement Patient did well to day with Therex. She required three rests on the bike but was able to complete 5 min. O2 sats were monitored throughout. Patient requires cues for nose breathing to return O2 sats  to normal levels. Patient demos tight hip flexors and complains of tightness with therex.    PT Next Visit Plan continue bike for endurance, LE strength, balance and monitor O2 sats.   Consulted and Agree with Plan of Care Patient        Problem List Patient Active Problem List   Diagnosis Date Noted  . Dry eye 08/29/2013  . Chemosis 08/29/2013  . 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 Meibomian gland disease 07/07/2013  . Deformity of eyelid 07/07/2013  . Arthritis or polyarthritis, rheumatoid 07/07/2013  . Scar of eyelid 07/07/2013  . Diabetes 09/23/2012  . DYSPNEA 09/23/2012  . Hypoxemia 09/23/2012  . Awaiting organ transplant 09/23/2012  . Chronic respiratory failure 07/07/2011  . GERD 09/06/2009  . PULMONARY FIBROSIS 10/12/2008  . Cough 10/12/2008   Jeni Salles PT  10/05/2014, 11:50 AM  [facility] [address], [place], [place], [address] Phone: [phone]   Fax:  [phone]

## 2014-10-11 ENCOUNTER — Encounter: Payer: Medicare Other | Admitting: Physical Therapy

## 2014-10-12 ENCOUNTER — Encounter: Payer: Self-pay | Admitting: Physical Therapy

## 2014-10-12 ENCOUNTER — Ambulatory Visit: Payer: Medicare Other | Admitting: Physical Therapy

## 2014-10-12 DIAGNOSIS — M810 Age-related osteoporosis without current pathological fracture: Secondary | ICD-10-CM

## 2014-10-12 DIAGNOSIS — R269 Unspecified abnormalities of gait and mobility: Secondary | ICD-10-CM

## 2014-10-12 DIAGNOSIS — Z7409 Other reduced mobility: Secondary | ICD-10-CM

## 2014-10-12 NOTE — Therapy (Signed)
[facility] [address] [place], [place], [address] Phone: [phone]   Fax:  [phone]  Physical Therapy Treatment  Patient Details  Name: Donna Watts MRN: [id number] Date of Birth: [birth year] Referring Provider:  Antony Blackbird, MD  Encounter Date: 10/12/2014      PT End of Session - 10/12/14 1413    Visit Number 3   Number of Visits 10   Date for PT Re-Evaluation 11/28/14   PT Start Time 1401   PT Stop Time 5361   PT Time Calculation (min) 44 min   Equipment Utilized During Treatment Back brace;Oxygen  pt on 8 Ltr O2 with activities, still needs to rest b/w tasks    Activity Tolerance Patient tolerated treatment well   Behavior During Therapy [facility] for tasks assessed/performed      Past Medical History  Diagnosis Date  . Other and unspecified hyperlipidemia   . Esophageal reflux   . Pulmonary fibrosis   . Diabetes mellitus without complication   . Acid reflux   . Rheumatoid arthritis   . Cancer     Past Surgical History  Procedure Laterality Date  . Appendectomy      There were no vitals filed for this visit.  Visit Diagnosis:  Osteoporosis  Decreased mobility and endurance  Abnormality of gait      Subjective Assessment - 10/12/14 1408    Subjective Pt complains feeling weak esspecially in the legs. Pt is on 8Ltr O2                         [facility] Adult PT Treatment/Exercise - 10/12/14 0001    Exercises   Exercises Knee/Hip   Knee/Hip Exercises: Aerobic   Stationary Bike L1 x 6 min with 20 second rest x 3  due to O2 level was low and decreased endurance   Knee/Hip Exercises: Standing   Heel Raises 2 sets;10 reps   Knee Flexion Strengthening;Both;2 sets;10 reps   Functional Squat --  30 sec x 8 with UE support, 30 sec x 7 with slight A by PTA   Rebounder 3 directions x 1 min pt tolerated well   Knee/Hip Exercises: Seated   Long Arc Quad Strengthening;Both;2 sets;10  reps   Long Arc Quad Weight 2 lbs.   Other Seated Knee Exercises ball squezzes 2x 10 with 5 sec hold   Other Seated Knee Exercises clam with yellow 2x10;                   PT Short Term Goals - 10/12/14 1416    PT SHORT TERM GOAL #1   Title Initiate HEP focusing on managing osteoporosis postural deficits and strength deficits    Time 4   Period Weeks   Status On-going   PT SHORT TERM GOAL #2   Title Will verbally understand correct body mechanics for home and work tasks to decrease strain on spine.    Time 4   Period Weeks   Status On-going   PT SHORT TERM GOAL #3   Title Will decrease time for 5x sit to stand to < or = 23 seconds without use of UE    Time 4   Period Weeks   Status On-going           PT Long Term Goals - 10/03/14 1551    PT LONG TERM GOAL #1   Title Will verbally understand do's and don'ts of osteoporosis management  Time 8   Period Weeks   Status New   PT LONG TERM GOAL #2   Title Will verbally understand ways to manage osteoporosis with diet and reduce strain postures on the spine    Time 8   Period Weeks   Status New   PT LONG TERM GOAL #3   Title Will verbally understand how to strengthen postural musculature    Time 8   Period Weeks   Status New   PT LONG TERM GOAL #4   Title Improve 5x sit to stand score to = or  < 15 seconds to decrease falls risk    Time 8   Period Weeks   Status New               Plan - 10/12/14 1414    Clinical Impression Statement Pt needed restbreak on the bike but able to complete 83mn   Pt will benefit from skilled therapeutic intervention in order to improve on the following deficits Abnormal gait;Difficulty walking;Decreased endurance;Cardiopulmonary status limiting activity;Decreased activity tolerance;Decreased balance;Decreased strength   Rehab Potential Good   PT Frequency 2x / week   PT Duration 8 weeks   PT Treatment/Interventions ADLs/Self Care Home Management;Energy  conservation;Therapeutic exercise;Balance training;Moist Heat;Therapeutic activities;Neuromuscular re-education;Functional mobility training;Stair training;Gait training;Passive range of motion;Patient/family education;Orthotic Fit/Training;Manual techniques   PT Next Visit Plan continue bike for endurance, LE strength, balance and monitor O2 sats.   Consulted and Agree with Plan of Care Patient        Problem List Patient Active Problem List   Diagnosis Date Noted  . Dry eye 08/29/2013  . Chemosis 08/29/2013  . 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 Meibomian gland disease 07/07/2013  . Deformity of eyelid 07/07/2013  . Arthritis or polyarthritis, rheumatoid 07/07/2013  . Scar of eyelid 07/07/2013  . Diabetes 09/23/2012  . DYSPNEA 09/23/2012  . Hypoxemia 09/23/2012  . Awaiting organ transplant 09/23/2012  . Chronic respiratory failure 07/07/2011  . GERD 09/06/2009  . PULMONARY FIBROSIS 10/12/2008  . Cough 10/12/2008    NAUMANN-HOUEGNIFIO,Tyrese Capriotti PTA 10/12/2014, 2:45 PM  [facility] [address] [place] [id number]: [phone]  Fax:  [phone]

## 2014-10-17 ENCOUNTER — Ambulatory Visit: Payer: Medicare Other | Admitting: Physical Therapy

## 2014-10-17 ENCOUNTER — Encounter: Payer: Self-pay | Admitting: Physical Therapy

## 2014-10-17 DIAGNOSIS — Z7409 Other reduced mobility: Secondary | ICD-10-CM

## 2014-10-17 DIAGNOSIS — R269 Unspecified abnormalities of gait and mobility: Secondary | ICD-10-CM | POA: Diagnosis not present

## 2014-10-17 DIAGNOSIS — M810 Age-related osteoporosis without current pathological fracture: Secondary | ICD-10-CM

## 2014-10-17 NOTE — Patient Instructions (Signed)
Osteoporosis   What is Osteoporosis?  - A silent disease in which the skeleton is weakened by decreased bone density. - Characterized by low bone mass, deterioration of bone, and increased risk of fracture postmenopausal (primary) or the result of an identifiable condition/event (secondary) - Commonly found in the wrists, spine, and hips; these are high-risk stress areas and very susceptible to fractures.  The Facts: - There are 1.5 million fractures/year o 500,000 spine; 250,000 hip with over 60,000 nursing home admissions secondary to hip fracture; and 200,000 wrist - After hip fracture, only 50% of people able to walk independently prior to the fracture return to independent ambulation. - Bone mass: Peaks at age 4-30, and begins declining at age 63-50.   Osteoporosis is defined by the [facility]) as:  NOF/WHO Criteria for Interpreting Results of Bone Density Assessment  Results Diagnosis  Within 1 standard deviation (SD) of young adult mean Normal  Between 1 and -2.5 SD below mean, repeat in 2 years Low bone mass (osteopenia)  Greater than -2.5 SD below mean Osteoporosis  Greater than -2.5 SD below mean and one or more fragility fractures exist Severe Osteoporosis  *Results can be affected by positioning of the body in the DEXA scan, presence of current or old fractures, arthritis, extraneous calcifications.    Osteoporosis is not just a women's disease!  - 30-40% of women will develop osteoporosis - 5-15% of males will develop osteoporosis   What are the risk factors?  1. Female 2. Thin, small frame 3. Caucasian, Asian race 4. Early menopause (<51 years old)/amenorrhea/delayed puberty 76. Old age 34. Family history (fractures, stooped posture)\ 7. Low calcium diet 8. Sedentary lifestyle 9. Alcohol, Caffeine, Smoking 10. Malnutrition, GI Disease 11. Prolonged use of Glucocorticoids (Prednisone), Meds to treat asthma, arthritis, cancers,  thyroid, and anti-seizure meds.  How do I know for sure?  Get a BONE DENSITY TEST!  This measures bone loss and it's painless, non-invasive, and only takes 5-10 minutes!  What can I do about it?  ? Decrease your risk factors (alcohol, caffeine, smoking) ? Helpful medications (see next page) ? Adequate Calcium and Vitamin D intake ? Get active! o Proper posture - Sit and stand tall! No slouching or twisting o Weight-Bearing Exercise - walking, stair climbing, elliptical; NO jogging or high-impact exercise. o Resistive Exercise - Cybex weight equipment, Nautilus, dumbbells, therabands  **Be sure to maintain proper alignment when lifting any weight!!  **When using equipment, avoid abdominal exercises which involve "crunching" or curling or twisting the trunk, biceps machines, cross-country machines, moving handlebars, or ANY MACHINE WITH ROTATION OR FORWARD BENDING!!!           Approved Pharmacologic Management of Osteoporosis  Agent Approved for prevention Approved for treatment BMD increased spine/hip Fracture reduction  Estrogen/Hormone Therapy (Estrace, Estratab, Ogen, Premarin, Vivell, Prempro, Femhert, Orthoest) Yes Yes 3-6% 35% spine and hip  Bisphosphonates  (Fosamax, Actonel, Boniva) Yes Yes 3-8% 35-50% spine and non-spine  Calcitonin (Miacalcin, Calcimar, Fortical) No Yes 0-3% None stated  Raloxifene (Evista) Yes Yes 2-3% 30-55%  Parathyroid Hormone (Forteo) No Yes, only in those at high risk for fracture None stated 53-65%     Recommended Daily Calcium Intakes   Population Group NIH/NOF* (mg elemental calcium)  Children 1-10 years [phone]  Children 11-24 years 14-1500  Men and Women 25-64 years At least 1200  Pregnant/Lactating At least 1200  Postmenopausal women with hormone replacement therapy At least 1200  Postmenopausal women without  hormone replacement therapy At least 1200  Men and women 65 + At least 1200  *In 1987, 1990, 1994, and 2000,  the NIH held consensus conferences on osteoporosis and calcium.  This column shows the most recent recommendations regarding calcium intak for preventing and managing osteoporosis.          Calcium Content of Selected Foods  Dairy Foods Calcium Content (mg) Non-Dairy Foods Calcium Content (mg)  Buttermilk, 1 cup 300 Calcium-fortified juice, 1 cup 300  Milk, 1 cup 300 Salmon, canned with Bones, 2 oz 100  Lactaid milk, 1 cup 300-500 Oysters, raw 13-19 medium 226  Soy milk, 1 cup 200-300 Sardines, canned with bones, 3 oz 372  Yogurt (plain, lowfat) 1 cup 250-300 Shrimp, canned 3 oz 98  Frozen yogurt (fruit) 1 cup 200-600 Collard greens, cooked 1 cup 357  Cheddar, mozzarella, or [place] cheese, 1oz 205 Broccoli, cooked 1 cup 78  Cottage cheese (lowfat) 4 oz 200 Soybeans, cooked 1 cup 131  Part-skim ricotta cheese, 4oz 335 Tofu, 4oz* *  Vanilla ice cream, 1 cup 120-300    *Calcium content of tofu varies depending on processing method; check nutritional label on package for precise calcium content.     Suggested Guidelines for Calcium Supplement Use:  ? Calcium is absorbed most efficiently if taken in small amounts throughout the day.  Always divide the daily dose into smaller amounts if the total daily dose is 562m or more per day.  The body cannot use more than 5071mCalcium at any one time. ? The use of manufactured supplements is encouraged.  Calcium as bone meal or dolomite may contain lead or other heavy metals as contaminants. ? Calcium supplements should not be taken with high fiber meals or with bulk forming laxatives. ? If calcium carbonate is used as the supplement form, it should be taken with meals to assure that stomach acid production is present to facilitate optimal dissolution and absorption of calcium.  This is important if atrophic gastritis with hypo- or achlorhydria is present, which it is in 20-50% of older individuals. ? It is important to drink plenty  of fluids while using the supplement to help reduce problems with side effects like constipation or bloating.  If these symptoms become a problem, switching to another form of supplement may be the answer. ? Another alternative is calcium-fortified foods, including fruit juices, cereals, and breads.  These foods are now marketed with added calcium and may be less likely to cause side effects. ? Those with personal or family histories of kidney stones should be monitored to assure that hypercalcuria does not occur. CALCIUM INTAKE QUIZ  Dairy products are the primary source of calcium for most people.  For a quick estimate of your daily calcium intake, complete the following steps:  1. Use the chart below to determine your daily intake of calcium from diary foods. Servings of dairy per day _0 Milligrams (mg) of calcium: 250 629 272 7495 1250 1500 1750 2000   2.  Enter your total daily calcium intake from dairy foods:     _____mg  3.  Add 350 mg, which is the average for all other dietary sources:                 +            350 mg  4.  The sum of your total daily calcium intake:  ______mg  5.  Enter the recommended calcium intake for your age from the chart below;         ______mg  6.  Enter your daily intake from step 4 above and subtract:                             -        _______mg  7.  The result is how much additional calcium you need:                                          ______mg      Recommended Daily Calcium Intake  Population Calcium (mg)  Children 1-10 years [phone]  Children 11-24 years 64-1500  Men and women 25-64 At least 1200  Pregnant/Lactating At least 1200  Postmenopausal women with hormone replacement therapy At least 1200  Postmenopausal women without hormone replacement therapy At least 1200  Men and women 65+ At least 1200       SAFETY TIPS FOR FALL PREVENTION   1. Remove throw rugs and make certain carpet edges are  securely fastened to the floor.  2. Reduce clutter, especially in traffic areas of the home.   3. Install/maintain sturdy handrails at stairs.  4. Increase wattage of lighting in hallways, bathrooms, kitchens, stairwells, and entrances to home.  5. Use night-lights near bed, in hallways, and in bathroom to improve night safety.  6. Install safety handrails in shower, tub, and around toilet.  Bathtubs and shower stalls should have non-skid surfaces.  7. When you must reach for something high, use a safety step stool, one with wide steps and a friction surface to stand on.  A type equipped with a high handrail is preferred.  8. If a cane or other walking aid has been recommended, use it to help increase your stability.  9. Wear supportive, cushioned, low-heeled shoes.  Avoid "scuffs" (backless bedroom slippers) and high heels.  10. Avoid rushing to answer a phone, doorbell, or anything else!  A portable phone that you can take from room to room with you is a good idea for security and safety.  11. Exercise regularly and stay active!!    [place] www.NOF.org   Exercise for Osteoporosis; A Safe and Effective Way to Build Bone Density and Muscle Strength By: Burnard Hawthorne, M.A.

## 2014-10-17 NOTE — Therapy (Signed)
[facility] [address] [place], [place], [address] Phone: [phone]   Fax:  [phone]  Physical Therapy Treatment  Patient Details  Name: Donna Watts MRN: [id number] Date of Birth: 12/13/1929 Referring Provider:  Antony Blackbird, MD  Encounter Date: 10/17/2014      PT End of Session - 10/17/14 0954    Visit Number 4   Number of Visits 10   Date for PT Re-Evaluation 11/28/14   PT Start Time 0929   PT Stop Time 3382   PT Time Calculation (min) 45 min   Equipment Utilized During Treatment Back brace;Oxygen   Activity Tolerance Patient tolerated treatment well   Behavior During Therapy [facility] for tasks assessed/performed  change O2 from 6 Ltr to 8 Ltr depending on activity      Past Medical History  Diagnosis Date  . Other and unspecified hyperlipidemia   . Esophageal reflux   . Pulmonary fibrosis   . Diabetes mellitus without complication   . Acid reflux   . Rheumatoid arthritis   . Cancer     Past Surgical History  Procedure Laterality Date  . Appendectomy      There were no vitals filed for this visit.  Visit Diagnosis:  Osteoporosis  Decreased mobility and endurance  Abnormality of gait      Subjective Assessment - 10/17/14 0938    Subjective Pt felt very well after last PT session, feel more loose more confidence   Currently in Pain? No/denies                         [facility] Adult PT Treatment/Exercise - 10/17/14 0001    Exercises   Exercises Knee/Hip;Shoulder   Knee/Hip Exercises: Aerobic   Stationary Bike L1 x 7 min with 20 second rest x 3  SATS 97, HR 82 at rest, with biking SATS 87 HR 101, incr. O2   Knee/Hip Exercises: Standing   Heel Raises 2 sets;10 reps   Forward Step Up Both;2 sets;10 reps  able to perform task in 1 min, with B UE support   Functional Squat --  sit to stand from chair 2 x 30sec x8 x 9, on 6Ltr O2   Functional Squat Limitations must use hands;  O2 91%; 98% with 1.5 min rest   Rebounder 3 directions x 1 min pt tolerated well   Shoulder Exercises: Seated   Horizontal ABduction Strengthening;20 reps  2 x 10 with yellow t-band                PT Education - 10/17/14 1006    Education provided Yes   Education Details Osteoporosis handout and Fall prevention   Person(s) Educated Patient   Methods Explanation;Verbal cues;Handout   Comprehension Verbalized understanding          PT Short Term Goals - 10/17/14 0949    PT SHORT TERM GOAL #1   Title Initiate HEP focusing on managing osteoporosis postural deficits and strength deficits    Time 4   Period Weeks   Status On-going   PT SHORT TERM GOAL #2   Title Will verbally understand correct body mechanics for home and work tasks to decrease strain on spine.    Time 4   Period Weeks   Status On-going   PT SHORT TERM GOAL #3   Title Will decrease time for 5x sit to stand to < or = 23 seconds without use of UE  Time 4   Status On-going           PT Long Term Goals - 10/17/14 0950    PT LONG TERM GOAL #1   Title Will verbally understand do's and don'ts of osteoporosis management    Time 8   Period Weeks   Status On-going   PT LONG TERM GOAL #3   Title Will verbally understand how to strengthen postural musculature    Time 8   Period Weeks   Status On-going   PT LONG TERM GOAL #4   Title Improve 5x sit to stand score to = or  < 15 seconds to decrease falls risk    Time 8   Period Weeks   Status On-going               Plan - 10/17/14 1010    Clinical Impression Statement Pt needs adjustment in O2 depending on activity, she needs restbreaks but recovering fast.    Pt will benefit from skilled therapeutic intervention in order to improve on the following deficits Abnormal gait;Difficulty walking;Decreased endurance;Cardiopulmonary status limiting activity;Decreased activity tolerance;Decreased balance;Decreased strength   Rehab Potential Good   PT  Frequency 2x / week   PT Duration 8 weeks   PT Treatment/Interventions ADLs/Self Care Home Management;Energy conservation;Therapeutic exercise;Balance training;Moist Heat;Therapeutic activities;Neuromuscular re-education;Functional mobility training;Stair training;Gait training;Passive range of motion;Patient/family education;Orthotic Fit/Training;Manual techniques   PT Next Visit Plan Add yellow T-band to HEP for UE strength, continue bike and stairs.    Consulted and Agree with Plan of Care Patient        Problem List Patient Active Problem List   Diagnosis Date Noted  . Dry eye 08/29/2013  . Chemosis 08/29/2013  . 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 Meibomian gland disease 07/07/2013  . Deformity of eyelid 07/07/2013  . Arthritis or polyarthritis, rheumatoid 07/07/2013  . Scar of eyelid 07/07/2013  . Diabetes 09/23/2012  . DYSPNEA 09/23/2012  . Hypoxemia 09/23/2012  . Awaiting organ transplant 09/23/2012  . Chronic respiratory failure 07/07/2011  . GERD 09/06/2009  . PULMONARY FIBROSIS 10/12/2008  . Cough 10/12/2008    NAUMANN-HOUEGNIFIO,Cyrene Gharibian PTA 10/17/2014, 10:20 AM  [facility] [address], [place], [place], [address] Phone: [phone]   Fax:  [phone]

## 2014-10-19 ENCOUNTER — Encounter: Payer: Self-pay | Admitting: Physical Therapy

## 2014-10-19 ENCOUNTER — Ambulatory Visit: Payer: Medicare Other | Admitting: Physical Therapy

## 2014-10-19 DIAGNOSIS — R269 Unspecified abnormalities of gait and mobility: Secondary | ICD-10-CM | POA: Diagnosis not present

## 2014-10-19 DIAGNOSIS — M810 Age-related osteoporosis without current pathological fracture: Secondary | ICD-10-CM

## 2014-10-19 DIAGNOSIS — Z7409 Other reduced mobility: Secondary | ICD-10-CM

## 2014-10-19 NOTE — Patient Instructions (Addendum)
Strengthening: Resisted External Rotation   Hold tubing in left hand, elbow at side and forearm out. Rotate forearm in across body. Repeat __10_ times per set. Do up to 2 sets per session. Do  2-3  sessions per day.  http://orth.exer.us/830   Copyright  VHI. All rights reserved.    Hold yellow t-band in both hands,(not shown in picture)  Keep elbow at side and move forearms out to side.  Repeat 10 times per set. Do 2 sets per session. Do    2-3 sessions per day.  http://orth.exer.us/828   Copyright  VHI. All rights reserved.  Strengthening: Resisted Flexion   Sit in chair. Hold yellow t-band in each hand - have band behing your back and move your arms forward as shown in picture.  Repeat 10  times per set. Do 2  sets per session. Do  2-3 sessions per day.  http://orth.exer.us/824   Copyright  VHI. All rights reserved.  Strengthening: Resisted Extension   Sit in chair. Hold tubing in both hands, move both arms to the side of your hips. Repeat 10 times per set. Do 2 sets per session. Do  2-3 sessions per day.  http://orth.exer.us/832   Copyright  VHI. All rights reserved.  Shoulder Horizontal Abduction / Adduction, Elbows Straight   Hold arms forward at chest level, elbows straight, thumbs up. Move both arms apart use yellow t-band to increase resistance. Repeat sequence 10  times per session. Do 2 sets per session. Do 2-3 sessions per day.  Copyright  VHI. All rights reserved.

## 2014-10-19 NOTE — Therapy (Signed)
[facility] [address], [place], [place], [address] Phone: [phone]   Fax:  [phone]  Physical Therapy Treatment  Patient Details  Name: Donna Watts MRN: [id number] Date of Birth: May 27, 1929 Referring Provider:  Antony Blackbird, MD  Encounter Date: 10/19/2014      PT End of Session - 10/19/14 1016    Visit Number 5   Number of Visits 10   Date for PT Re-Evaluation 11/28/14   PT Start Time 0929   PT Stop Time 1188   PT Time Calculation (min) 45 min      Past Medical History  Diagnosis Date  . Other and unspecified hyperlipidemia   . Esophageal reflux   . Pulmonary fibrosis   . Diabetes mellitus without complication   . Acid reflux   . Rheumatoid arthritis   . Cancer     Past Surgical History  Procedure Laterality Date  . Appendectomy      There were no vitals filed for this visit.  Visit Diagnosis:  Osteoporosis  Decreased mobility and endurance  Abnormality of gait      Subjective Assessment - 10/19/14 0935    Subjective Pt felt very tired after last visit, pt took a nap, however, she feels more energized since start of PT   Multiple Pain Sites No                         [facility] Adult PT Treatment/Exercise - 10/19/14 0001    Exercises   Exercises Knee/Hip;Shoulder   Knee/Hip Exercises: Aerobic   Stationary Bike St4 L1 x 8 min with 20 second rest x 3   Knee/Hip Exercises: Standing   Heel Raises 2 sets;10 reps   Lateral Step Up Both;1 set;10 reps   Forward Step Up Both;2 sets;10 reps   Shoulder Exercises: Seated   Extension Strengthening;20 reps   Horizontal ABduction Strengthening;20 reps  with yellow t-band   External Rotation Strengthening  with yellow t-band   Other Seated Exercises D2 each side 2x10 with yellow t-band                PT Education - 10/19/14 1014    Education provided Yes   Education Details yellow t-band: all in sitting, bil external  rotation, bil extension, bil flexion    Person(s) Educated Patient   Methods Explanation;Demonstration;Handout   Comprehension Verbalized understanding          PT Short Term Goals - 10/17/14 0949    PT SHORT TERM GOAL #1   Title Initiate HEP focusing on managing osteoporosis postural deficits and strength deficits    Time 4   Period Weeks   Status On-going   PT SHORT TERM GOAL #2   Title Will verbally understand correct body mechanics for home and work tasks to decrease strain on spine.    Time 4   Period Weeks   Status On-going   PT SHORT TERM GOAL #3   Title Will decrease time for 5x sit to stand to < or = 23 seconds without use of UE    Time 4   Status On-going           PT Long Term Goals - 10/17/14 0950    PT LONG TERM GOAL #1   Title Will verbally understand do's and don'ts of osteoporosis management    Time 8   Period Weeks   Status On-going   PT LONG TERM GOAL #3  Title Will verbally understand how to strengthen postural musculature    Time 8   Period Weeks   Status On-going   PT LONG TERM GOAL #4   Title Improve 5x sit to stand score to = or  < 15 seconds to decrease falls risk    Time 8   Period Weeks   Status On-going               Plan - 10/19/14 1016    Clinical Impression Statement Pt with increased endurance as noted she was able to pedal on the bike for 33mn without restbreak on 6Ltr 02.    Pt will benefit from skilled therapeutic intervention in order to improve on the following deficits Abnormal gait;Difficulty walking;Decreased endurance;Cardiopulmonary status limiting activity;Decreased activity tolerance;Decreased balance;Decreased strength   Rehab Potential Good   PT Frequency 2x / week   PT Duration 8 weeks   PT Treatment/Interventions ADLs/Self Care Home Management;Energy conservation;Therapeutic exercise;Balance training;Moist Heat;Therapeutic activities;Neuromuscular re-education;Functional mobility training;Stair training;Gait  training;Passive range of motion;Patient/family education;Orthotic Fit/Training;Manual techniques   PT Next Visit Plan review exercises with yellow t-band for UE strength, may add D2   Consulted and Agree with Plan of Care Patient        Problem List Patient Active Problem List   Diagnosis Date Noted  . Dry eye 08/29/2013  . Chemosis 08/29/2013  . 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 Meibomian gland disease 07/07/2013  . Deformity of eyelid 07/07/2013  . Arthritis or polyarthritis, rheumatoid 07/07/2013  . Scar of eyelid 07/07/2013  . Diabetes 09/23/2012  . DYSPNEA 09/23/2012  . Hypoxemia 09/23/2012  . Awaiting organ transplant 09/23/2012  . Chronic respiratory failure 07/07/2011  . GERD 09/06/2009  . PULMONARY FIBROSIS 10/12/2008  . Cough 10/12/2008    NAUMANN-HOUEGNIFIO,Mannat Benedetti PTA 10/19/2014, 10:28 AM  [facility] [address] [place] [address]Phone: [phone]  Fax:  [phone]

## 2014-10-23 ENCOUNTER — Other Ambulatory Visit: Payer: Self-pay

## 2014-10-24 ENCOUNTER — Ambulatory Visit: Payer: Medicare Other | Admitting: Physical Therapy

## 2014-10-24 ENCOUNTER — Encounter: Payer: Self-pay | Admitting: Physical Therapy

## 2014-10-24 DIAGNOSIS — R269 Unspecified abnormalities of gait and mobility: Secondary | ICD-10-CM | POA: Diagnosis not present

## 2014-10-24 DIAGNOSIS — M810 Age-related osteoporosis without current pathological fracture: Secondary | ICD-10-CM

## 2014-10-24 DIAGNOSIS — Z7409 Other reduced mobility: Secondary | ICD-10-CM

## 2014-10-24 NOTE — Therapy (Signed)
[facility] [address], [place], [place], [address] Phone: [phone]   Fax:  [phone]  Physical Therapy Treatment  Patient Details  Name: Donna Watts MRN: [id number] Date of Birth: 1929/09/22 Referring Provider:  Antony Blackbird, MD  Encounter Date: 10/24/2014      PT End of Session - 10/24/14 0958    Visit Number 6   Number of Visits 10   Date for PT Re-Evaluation 11/28/14   PT Start Time 0929   PT Stop Time 0272   PT Time Calculation (min) 45 min   Equipment Utilized During Treatment Back brace;Oxygen   Activity Tolerance Patient tolerated treatment well   Behavior During Therapy [facility] for tasks assessed/performed      Past Medical History  Diagnosis Date  . Other and unspecified hyperlipidemia   . Esophageal reflux   . Pulmonary fibrosis   . Diabetes mellitus without complication   . Acid reflux   . Rheumatoid arthritis   . Cancer     Past Surgical History  Procedure Laterality Date  . Appendectomy      There were no vitals filed for this visit.  Visit Diagnosis:  Osteoporosis  Decreased mobility and endurance  Abnormality of gait      Subjective Assessment - 10/24/14 0945    Subjective Pt reports feels improved confidence with walking and improvement with endurance with daily activities    Multiple Pain Sites No                         [facility] Adult PT Treatment/Exercise - 10/24/14 0001    Knee/Hip Exercises: Aerobic   Stationary Bike St3 L1 x 8 min, pt on 8Ltr O2 with this task   needed on break after 5 min (timer stopped) to recover    Knee/Hip Exercises: Standing   Lateral Step Up Both;2 sets;10 reps   Forward Step Up Both;2 sets;10 reps   Functional Squat --   Functional Squat Limitations --   Rebounder 3 directions x 1 min pt tolerated well   Shoulder Exercises: Seated   Extension Strengthening;20 reps   Theraband Level (Shoulder Extension) Level 1 (Yellow)   2x10   Horizontal ABduction Strengthening;20 reps  with yellow t-band   External Rotation Strengthening;20 reps  with yellow t-band                  PT Short Term Goals - 10/24/14 1001    PT SHORT TERM GOAL #1   Title Initiate HEP focusing on managing osteoporosis postural deficits and strength deficits    Time 4   Period Weeks   Status On-going   PT SHORT TERM GOAL #2   Title Will verbally understand correct body mechanics for home and work tasks to decrease strain on spine.    Time 4   Period Weeks   Status Partially Met  handout given on 10-03-14, and verbally reviewed today   PT SHORT TERM GOAL #3   Title Will decrease time for 5x sit to stand to < or = 23 seconds without use of UE    Time 4   Period Weeks   Status On-going           PT Long Term Goals - 10/17/14 0950    PT LONG TERM GOAL #1   Title Will verbally understand do's and don'ts of osteoporosis management    Time 8   Period Weeks   Status On-going  PT LONG TERM GOAL #3   Title Will verbally understand how to strengthen postural musculature    Time 8   Period Weeks   Status On-going   PT LONG TERM GOAL #4   Title Improve 5x sit to stand score to = or  < 15 seconds to decrease falls risk    Time 8   Period Weeks   Status On-going               Plan - 10/24/14 0959    Clinical Impression Statement Pt continues to gain with endurance and confidence with ambulation, pt needs to adjust O2 Sats depending on activity level   Pt will benefit from skilled therapeutic intervention in order to improve on the following deficits Abnormal gait;Difficulty walking;Decreased endurance;Cardiopulmonary status limiting activity;Decreased activity tolerance;Decreased balance;Decreased strength   Rehab Potential Good   PT Frequency 2x / week   PT Duration 8 weeks   PT Treatment/Interventions ADLs/Self Care Home Management;Energy conservation;Therapeutic exercise;Balance training;Moist Heat;Therapeutic  activities;Neuromuscular re-education;Functional mobility training;Stair training;Gait training;Passive range of motion;Patient/family education;Orthotic Fit/Training;Manual techniques   PT Next Visit Plan review exercises with yellow t-band for UE strength, may add D2   Consulted and Agree with Plan of Care Patient        Problem List Patient Active Problem List   Diagnosis Date Noted  . Dry eye 08/29/2013  . Chemosis 08/29/2013  . 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 Meibomian gland disease 07/07/2013  . Deformity of eyelid 07/07/2013  . Arthritis or polyarthritis, rheumatoid 07/07/2013  . Scar of eyelid 07/07/2013  . Diabetes 09/23/2012  . DYSPNEA 09/23/2012  . Hypoxemia 09/23/2012  . Awaiting organ transplant 09/23/2012  . Chronic respiratory failure 07/07/2011  . GERD 09/06/2009  . PULMONARY FIBROSIS 10/12/2008  . Cough 10/12/2008    NAUMANN-HOUEGNIFIO,Arby Dahir PTA 10/24/2014, 10:18 AM  [facility] [address] [place], [place], [address] Phone: [phone]   Fax:  [phone]

## 2014-10-27 ENCOUNTER — Encounter: Payer: Medicare Other | Admitting: Physical Therapy

## 2014-10-31 ENCOUNTER — Ambulatory Visit: Payer: Medicare Other | Attending: Internal Medicine

## 2014-10-31 DIAGNOSIS — M810 Age-related osteoporosis without current pathological fracture: Secondary | ICD-10-CM

## 2014-10-31 DIAGNOSIS — Z7409 Other reduced mobility: Secondary | ICD-10-CM | POA: Insufficient documentation

## 2014-10-31 DIAGNOSIS — R269 Unspecified abnormalities of gait and mobility: Secondary | ICD-10-CM | POA: Diagnosis present

## 2014-10-31 NOTE — Therapy (Signed)
[facility] [address] [place], [place], [address] Phone: [phone]   Fax:  [phone]  Physical Therapy Treatment  Patient Details  Name: Donna Watts MRN: [id number] Date of Birth: July 18, 1929 Referring Provider:  Antony Blackbird, MD  Encounter Date: 10/31/2014      PT End of Session - 10/31/14 1547    Visit Number 7   Number of Visits 10   Date for PT Re-Evaluation 11/28/14   PT Start Time 2951   PT Stop Time 1534   PT Time Calculation (min) 36 min   Equipment Utilized During Treatment Back brace;Oxygen   Activity Tolerance Patient tolerated treatment well;Patient limited by fatigue   Behavior During Therapy [facility] for tasks assessed/performed      Past Medical History  Diagnosis Date  . Other and unspecified hyperlipidemia   . Esophageal reflux   . Pulmonary fibrosis   . Diabetes mellitus without complication   . Acid reflux   . Rheumatoid arthritis   . Cancer     Past Surgical History  Procedure Laterality Date  . Appendectomy      There were no vitals filed for this visit.  Visit Diagnosis:  Osteoporosis  Decreased mobility and endurance  Abnormality of gait      Subjective Assessment - 10/31/14 1502    Subjective Fingers are casted due to "pulling a tendon." Reports that muscles are feeling tight after exercising for the past 4 days. Has had some difficulty with breathing due to humidity and allergies.    Patient Stated Goals Get stronger in her legs    Currently in Pain? No/denies                         [facility] Adult PT Treatment/Exercise - 10/31/14 0001    Knee/Hip Exercises: Aerobic   Stationary Bike St3 L1 x 5 min, pt on 8Ltr O2 with this task    Recumbent Bike Resistance 3 for 1.5 minutes   Had to take a rest break in order for O2 to recover   Knee/Hip Exercises: Standing   Forward Step Up Both;1 set;10 reps  Stopped exercise due to decreased oxygen    Forward  Step Up Limitations Rest break for O2 recovery   SLS 30 seconds each legx2 ; 1 finger support on trampoline   Knee/Hip Exercises: Seated   Long Arc Quad Strengthening;Both;10 reps;3 sets  2.5#; VC for 2 second hold    Other Seated Knee/Hip Exercises ball squeezes 2x 10 with 5 sec hold   Abduction/Adduction  Strengthening;Both;3 sets;10 reps  red theraband, 30 second break between sets                  PT Short Term Goals - 10/24/14 1001    PT SHORT TERM GOAL #1   Title Initiate HEP focusing on managing osteoporosis postural deficits and strength deficits    Time 4   Period Weeks   Status On-going   PT SHORT TERM GOAL #2   Title Will verbally understand correct body mechanics for home and work tasks to decrease strain on spine.    Time 4   Period Weeks   Status Partially Met  handout given on 10-03-14, and verbally reviewed today   PT SHORT TERM GOAL #3   Title Will decrease time for 5x sit to stand to < or = 23 seconds without use of UE    Time 4   Period Weeks  Status On-going           PT Long Term Goals - 10/17/14 0950    PT LONG TERM GOAL #1   Title Will verbally understand do's and don'ts of osteoporosis management    Time 8   Period Weeks   Status On-going   PT LONG TERM GOAL #3   Title Will verbally understand how to strengthen postural musculature    Time 8   Period Weeks   Status On-going   PT LONG TERM GOAL #4   Title Improve 5x sit to stand score to = or  < 15 seconds to decrease falls risk    Time 8   Period Weeks   Status On-going               Plan - 10/31/14 1548    Clinical Impression Statement Pt had limited tolerance for standing exercises due to SOB secondary to allergies/humid environment. Had to take frequent rest breaks due to decreased O2  stats and required recovery. Focused primarily on seated LE strengthening exercises with rest breaks between sets. Will benefit from continued skilled PT for balance, endurance,  strengthening and osteoporosis education.    Pt will benefit from skilled therapeutic intervention in order to improve on the following deficits Abnormal gait;Difficulty walking;Decreased endurance;Cardiopulmonary status limiting activity;Decreased activity tolerance;Decreased balance;Decreased strength   Rehab Potential Good   PT Frequency 2x / week   PT Duration 8 weeks   PT Treatment/Interventions ADLs/Self Care Home Management;Energy conservation;Therapeutic exercise;Balance training;Moist Heat;Therapeutic activities;Neuromuscular re-education;Functional mobility training;Stair training;Gait training;Passive range of motion;Patient/family education;Orthotic Fit/Training;Manual techniques   PT Next Visit Plan SLS balance at counter, tandem stance walking, standing hip abduction/extension; seated D2/shoulder exercises if finger is better, check 5x sit to stand goals and review other goals.   Consulted and Agree with Plan of Care Patient        Problem List Patient Active Problem List   Diagnosis Date Noted  . Dry eye 08/29/2013  . Chemosis 08/29/2013  . 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 Meibomian gland disease 07/07/2013  . Deformity of eyelid 07/07/2013  . Arthritis or polyarthritis, rheumatoid 07/07/2013  . Scar of eyelid 07/07/2013  . Diabetes 09/23/2012  . DYSPNEA 09/23/2012  . Hypoxemia 09/23/2012  . Awaiting organ transplant 09/23/2012  . Chronic respiratory failure 07/07/2011  . GERD 09/06/2009  . PULMONARY FIBROSIS 10/12/2008  . Cough 10/12/2008   Reginal Lutes, SPT 10/31/2014 4:09 PM   During this treatment session, the therapist was present, participating in, and directing the treatment. TAKACS,KELLY, PT 10/31/2014, 4:09 PM  [facility] [address] [place], [place], [address] Phone: [phone]   Fax:  [phone]

## 2014-11-01 ENCOUNTER — Ambulatory Visit: Payer: Medicare Other

## 2014-11-01 DIAGNOSIS — R269 Unspecified abnormalities of gait and mobility: Secondary | ICD-10-CM

## 2014-11-01 DIAGNOSIS — Z7409 Other reduced mobility: Secondary | ICD-10-CM

## 2014-11-01 DIAGNOSIS — M810 Age-related osteoporosis without current pathological fracture: Secondary | ICD-10-CM

## 2014-11-01 NOTE — Therapy (Signed)
[facility] [address], [place], [place], [address] Phone: [phone]   Fax:  [phone]  Physical Therapy Treatment  Patient Details  Name: Donna Watts MRN: [id number] Date of Birth: Nov 07, 1929 Referring Provider:  Antony Blackbird, MD  Encounter Date: 11/01/2014      PT End of Session - 11/01/14 1616    Visit Number 8   Number of Visits 10   Date for PT Re-Evaluation 11/28/14   PT Start Time 1525   PT Stop Time 1612   PT Time Calculation (min) 47 min   Equipment Utilized During Treatment Oxygen   Activity Tolerance Patient tolerated treatment well   Behavior During Therapy [facility] for tasks assessed/performed      Past Medical History  Diagnosis Date  . Other and unspecified hyperlipidemia   . Esophageal reflux   . Pulmonary fibrosis   . Diabetes mellitus without complication   . Acid reflux   . Rheumatoid arthritis   . Cancer     Past Surgical History  Procedure Laterality Date  . Appendectomy      There were no vitals filed for this visit.  Visit Diagnosis:  Osteoporosis  Decreased mobility and endurance  Abnormality of gait      Subjective Assessment - 11/01/14 1528    Subjective Feeling okay today; reports that the allergies are doing better than yesterday.    Patient Stated Goals Get stronger in her legs    Currently in Pain? No/denies            [facility] PT Assessment - 11/01/14 0001    Standardized Balance Assessment   Standardized Balance Assessment Five Times Sit to Stand   Five times sit to stand comments  23 seconds  Did not require UE support                      [facility] Adult PT Treatment/Exercise - 11/01/14 0001    Knee/Hip Exercises: Aerobic   Stationary Bike L1 x 5 minutes/ required 2 rest breaks at 3 and 4 minute mark    Knee/Hip Exercises: Standing   Other Standing Knee Exercises standing hip abduction/extension 3x10    Other Standing Knee Exercises tandem  walking forward;backward x 4   side step at counter with yellow theraband x 6    Knee/Hip Exercises: Seated   Long Arc Quad Strengthening;Both;10 reps;3 sets  2.5# for Rt.; Lt.has 3#    Other Seated Knee/Hip Exercises ball squeezes 2x 10 with 5 sec hold                  PT Short Term Goals - 11/01/14 1536    PT SHORT TERM GOAL #1   Title Initiate HEP focusing on managing osteoporosis postural deficits and strength deficits    Time 4   Period Weeks   Status On-going   PT SHORT TERM GOAL #2   Title Will verbally understand correct body mechanics for home and work tasks to decrease strain on spine.   Uses the thoracic brace at home when outside and doing heavy work   Time 4   Period Weeks   Status Achieved   PT SHORT TERM GOAL #3   Title Will decrease time for 5x sit to stand to < or = 23 seconds without use of UE   23 seconds without use of arm support   Status Achieved           PT Long Term Goals -  10/17/14 0950    PT LONG TERM GOAL #1   Title Will verbally understand do's and don'ts of osteoporosis management    Time 8   Period Weeks   Status On-going   PT LONG TERM GOAL #3   Title Will verbally understand how to strengthen postural musculature    Time 8   Period Weeks   Status On-going   PT LONG TERM GOAL #4   Title Improve 5x sit to stand score to = or  < 15 seconds to decrease falls risk    Time 8   Period Weeks   Status On-going               Plan - 11/01/14 1617    Clinical Impression Statement Demonstrated tolerance for standing and balance walking exercises today. Able to progress with balance walking exercises without use of UE support and supervision. Requires seated break for 1 min for improved oxygen saturation after standing activities. Limited endurance secondary to difficulty breathing, but pt reports that it is better today. Reports wearing back brace only for use outside and when doing strenous activity to protect spine; able to  recall motions to avoid to protect spine. Will continue to benefit from skilled PT for endurance, balance and general strengthening.     Pt will benefit from skilled therapeutic intervention in order to improve on the following deficits Abnormal gait;Difficulty walking;Decreased endurance;Cardiopulmonary status limiting activity;Decreased activity tolerance;Decreased balance;Decreased strength   Rehab Potential Good   PT Frequency 2x / week   PT Duration 8 weeks   PT Treatment/Interventions ADLs/Self Care Home Management;Energy conservation;Therapeutic exercise;Balance training;Moist Heat;Therapeutic activities;Neuromuscular re-education;Functional mobility training;Stair training;Gait training;Passive range of motion;Patient/family education;Orthotic Fit/Training;Manual techniques   PT Next Visit Plan Shoulder exercises/ upper extremity bike, increase resistance on bike to 1 and try to do for 2 min of/1 min off    Consulted and Agree with Plan of Care Patient        Problem List Patient Active Problem List   Diagnosis Date Noted  . Dry eye 08/29/2013  . Chemosis 08/29/2013  . 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 Meibomian gland disease 07/07/2013  . Deformity of eyelid 07/07/2013  . Arthritis or polyarthritis, rheumatoid 07/07/2013  . Scar of eyelid 07/07/2013  . Diabetes 09/23/2012  . DYSPNEA 09/23/2012  . Hypoxemia 09/23/2012  . Awaiting organ transplant 09/23/2012  . Chronic respiratory failure 07/07/2011  . GERD 09/06/2009  . PULMONARY FIBROSIS 10/12/2008  . Cough 10/12/2008   Reginal Lutes, SPT 11/01/2014 4:33 PM  During this treatment session, the therapist was present, participating in, and directing the treatment. TAKACS,KELLY, PT 11/01/2014, 4:33 PM  [facility] [address], [place], [place], [address] Phone: [phone]   Fax:  [phone]

## 2014-11-07 ENCOUNTER — Ambulatory Visit: Payer: Medicare Other | Admitting: Physical Therapy

## 2014-11-07 ENCOUNTER — Encounter: Payer: Self-pay | Admitting: Physical Therapy

## 2014-11-07 DIAGNOSIS — M810 Age-related osteoporosis without current pathological fracture: Secondary | ICD-10-CM | POA: Diagnosis not present

## 2014-11-07 DIAGNOSIS — Z7409 Other reduced mobility: Secondary | ICD-10-CM

## 2014-11-07 DIAGNOSIS — R269 Unspecified abnormalities of gait and mobility: Secondary | ICD-10-CM

## 2014-11-07 NOTE — Therapy (Signed)
[facility] [address], [place], [place], [address] Phone: [phone]   Fax:  [phone]  Physical Therapy Treatment  Patient Details  Name: Donna Watts MRN: [id number] Date of Birth: [birth year] Referring Provider:  Antony Blackbird, MD  Encounter Date: 11/07/2014      PT End of Session - 11/07/14 1507    Visit Number 9   Number of Visits 10   Date for PT Re-Evaluation 11/28/14   PT Start Time 5102   PT Stop Time 1530   PT Time Calculation (min) 46 min   Equipment Utilized During Treatment Oxygen   Activity Tolerance Patient tolerated treatment well   Behavior During Therapy [facility] for tasks assessed/performed      Past Medical History  Diagnosis Date  . Other and unspecified hyperlipidemia   . Esophageal reflux   . Pulmonary fibrosis   . Diabetes mellitus without complication   . Acid reflux   . Rheumatoid arthritis   . Cancer     Past Surgical History  Procedure Laterality Date  . Appendectomy      There were no vitals filed for this visit.  Visit Diagnosis:  Osteoporosis  Decreased mobility and endurance  Abnormality of gait      Subjective Assessment - 11/07/14 1448    Subjective Pt reports she is feeling good today, despite the heat. Pt has MD appointment tomorrow to address injury on left hand not related to PT diagnosis   Multiple Pain Sites No                         [facility] Adult PT Treatment/Exercise - 11/07/14 0001    Knee/Hip Exercises: Aerobic   Stationary Bike L1 x 6 minutes/ required 2 rest breaks at 3 and 4 minute mark   needed a restbreak after 88mn, O2 down to 86% rest until 93%   Knee/Hip Exercises: Standing   Lateral Step Up Both;2 sets;10 reps   Forward Step Up Both;2 sets;10 reps  O2 drops down to 79 or 83%, restbreak to recover   SLS --   Rebounder 3 directions x 1 min pt tolerated well  shoes off   Other Standing Knee Exercises tandem stance 2x10sec,  2x20 sec  pt with good balance with task   Knee/Hip Exercises: Seated   Long Arc Quad Strengthening;Both;10 reps;3 sets  2.5# each leg   Other Seated Knee/Hip Exercises ball squeezes 2x 10 with 5 sec hold  pt O2 sats are stable over 92% with sitting tasks                  PT Short Term Goals - 11/07/14 1459    PT SHORT TERM GOAL #1   Title Initiate HEP focusing on managing osteoporosis postural deficits and strength deficits    Time 4   Period Weeks   Status Achieved   PT SHORT TERM GOAL #2   Title Will verbally understand correct body mechanics for home and work tasks to decrease strain on spine.    Time 4   Period Weeks   Status Achieved   PT SHORT TERM GOAL #3   Title Will decrease time for 5x sit to stand to < or = 23 seconds without use of UE    Time 4   Period Weeks   Status Achieved           PT Long Term Goals - 11/07/14 1500    PT LONG  TERM GOAL #1   Title Will verbally understand do's and don'ts of osteoporosis management    Time 8   Period Weeks   Status On-going   PT LONG TERM GOAL #2   Title Will verbally understand ways to manage osteoporosis with diet and reduce strain postures on the spine    Time 8   Period Weeks   Status On-going   PT LONG TERM GOAL #3   Title Will verbally understand how to strengthen postural musculature    Time 8   Period Weeks   Status On-going   PT LONG TERM GOAL #4   Title Improve 5x sit to stand score to = or  < 15 seconds to decrease falls risk    Period Weeks   Status On-going               Plan - 11/07/14 1509    Clinical Impression Statement Pt demonstrates tolerance for standing and walking with restbreaks b/w to increase oxygen saturation. Pt albe to perform tandem stance up to 20sec without UE support. Pt wearing her back brace to protect her spine.  her soft brace   Pt will benefit from skilled therapeutic intervention in order to improve on the following deficits Abnormal gait;Difficulty  walking;Decreased endurance;Cardiopulmonary status limiting activity;Decreased activity tolerance;Decreased balance;Decreased strength   Rehab Potential Good   PT Frequency 2x / week   PT Duration 8 weeks   PT Treatment/Interventions ADLs/Self Care Home Management;Energy conservation;Therapeutic exercise;Balance training;Moist Heat;Therapeutic activities;Neuromuscular re-education;Functional mobility training;Stair training;Gait training;Passive range of motion;Patient/family education;Orthotic Fit/Training;Manual techniques   PT Next Visit Plan Shoulder exercises/ upper extremity bike, increase resistance on bike to 1 and try to do for 2 min of/1 min off    Consulted and Agree with Plan of Care Patient        Problem List Patient Active Problem List   Diagnosis Date Noted  . Dry eye 08/29/2013  . Chemosis 08/29/2013  . 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 Meibomian gland disease 07/07/2013  . Deformity of eyelid 07/07/2013  . Arthritis or polyarthritis, rheumatoid 07/07/2013  . Scar of eyelid 07/07/2013  . Diabetes 09/23/2012  . DYSPNEA 09/23/2012  . Hypoxemia 09/23/2012  . Awaiting organ transplant 09/23/2012  . Chronic respiratory failure 07/07/2011  . GERD 09/06/2009  . PULMONARY FIBROSIS 10/12/2008  . Cough 10/12/2008    NAUMANN-HOUEGNIFIO,Katrenia Alkins PTA 11/07/2014, 3:31 PM  [facility] [address], [place], [place], [address] Phone: [phone]   Fax:  [phone]

## 2014-11-10 ENCOUNTER — Encounter: Payer: Self-pay | Admitting: Physical Therapy

## 2014-11-10 ENCOUNTER — Ambulatory Visit: Payer: Medicare Other | Admitting: Physical Therapy

## 2014-11-10 DIAGNOSIS — M810 Age-related osteoporosis without current pathological fracture: Secondary | ICD-10-CM

## 2014-11-10 DIAGNOSIS — Z7409 Other reduced mobility: Secondary | ICD-10-CM

## 2014-11-10 DIAGNOSIS — R269 Unspecified abnormalities of gait and mobility: Secondary | ICD-10-CM

## 2014-11-10 NOTE — Therapy (Signed)
[facility] [address] [place], [place], [address] Phone: [phone]   Fax:  [phone]  Physical Therapy Treatment  Patient Details  Name: Donna Watts MRN: [id number] Date of Birth: [birth year] Referring Provider:  Antony Blackbird, MD  Encounter Date: 11/10/2014      PT End of Session - 11/10/14 1050    Visit Number 10   Number of Visits 10   Date for PT Re-Evaluation 11/28/14   PT Start Time 9242   PT Stop Time 1059   PT Time Calculation (min) 44 min   Equipment Utilized During Treatment Oxygen  Pt is on 8Ltr O2 with activities    Activity Tolerance Patient tolerated treatment well   Behavior During Therapy [facility] for tasks assessed/performed      Past Medical History  Diagnosis Date  . Other and unspecified hyperlipidemia   . Esophageal reflux   . Pulmonary fibrosis   . Diabetes mellitus without complication   . Acid reflux   . Rheumatoid arthritis   . Cancer     Past Surgical History  Procedure Laterality Date  . Appendectomy      There were no vitals filed for this visit.  Visit Diagnosis:  Osteoporosis  Decreased mobility and endurance  Abnormality of gait      Subjective Assessment - 11/10/14 1049    Subjective Pt complains about weakness in her legs, however sit to stand test pt showed 505 improvement.    Pertinent History Fall 1 month ago    How long can you walk comfortably? About 15 minutes due to decreased endurance    Patient Stated Goals Get stronger in her legs    Currently in Pain? No/denies   Multiple Pain Sites No            [facility] PT Assessment - 11/10/14 0001    Assessment   Medical Diagnosis M81.0 - Osteoporosis; R29.6 - Falls Frequently    Onset Date/Surgical Date 09/02/14   Prior Therapy Pulmonary rehab   Precautions   Precautions Fall;Other (comment)  depends on O2 8Ltr with activities, 6Ltr with rest,     Precaution Comments T score: -3.6, hx of CA   Required Braces or Orthoses Spinal Brace   Spinal Brace Thoracolumbosacral orthotic   Restrictions   Weight Bearing Restrictions No   Balance Screen   Has the patient fallen in the past 6 months Yes   Has the patient had a decrease in activity level because of a fear of falling?  Yes   Is the patient reluctant to leave their home because of a fear of falling?  No   Home Environment   Living Environment Private residence   Living Arrangements Children   Type of [place] to enter   [place] - 4 wheels;Cane - single point;Shower seat;Grab bars - tub/shower;Grab bars - toilet   Prior Function   Level of Independence Independent with basic ADLs   Vocation Retired   Leisure Reading, Firefighter, Chemical engineer, puzzle   Cognition   Overall Cognitive Status Within Functional Limits for tasks assessed   Posture/Postural Control   Posture/Postural Control Postural limitations   Postural Limitations Increased thoracic kyphosis   ROM / Strength   AROM / PROM / Strength Strength   AROM   Overall AROM  Within functional limits for tasks performed   Strength   Overall Strength Deficits   Overall Strength Comments Bil. shoulder flexion/abd: 4/5; all other motions  5/5. Bil hip  strength: 3+/5; Lt. knee extension: 4+/5; all other LE strength 5/5    Standardized Balance Assessment   Standardized Balance Assessment Five Times Sit to Stand   Five times sit to stand comments  18 seconds  with UE support on thights, second trail 14 sec without UE                      [facility] Adult PT Treatment/Exercise - 11/10/14 0001    Knee/Hip Exercises: Aerobic   Stationary Bike L1 x 6 minutes no restbreak reuired today   Knee/Hip Exercises: Standing   Lateral Step Up Both;2 sets;10 reps  added 2.5# on each ankle   Forward Step Up Both;2 sets;10 reps  sitting restbreaks b/w sets 2.5# added   Rebounder 3 directions x 1 min pt tolerated well   Other Standing Knee  Exercises transfer sit to stand 5x 1st trial 18sec, 2nd trail 14sec    Knee/Hip Exercises: Seated   Long Arc Quad Strengthening;Both;10 reps;4 sets  2.5# each leg   Shoulder Exercises: ROM/Strengthening   Rebounder UBE L0 x 3 (2/1)                  PT Short Term Goals - 11/10/14 1101    PT SHORT TERM GOAL #1   Title Initiate HEP focusing on managing osteoporosis postural deficits and strength deficits    Time 4   Period Weeks   Status Achieved   PT SHORT TERM GOAL #2   Title Will verbally understand correct body mechanics for home and work tasks to decrease strain on spine.    Time 4   Period Weeks   Status Achieved   PT SHORT TERM GOAL #3   Title Will decrease time for 5x sit to stand to < or = 23 seconds without use of UE    Time 4   Period Weeks   Status Achieved           PT Long Term Goals - 11/10/14 1102    PT LONG TERM GOAL #1   Title Will verbally understand do's and don'ts of osteoporosis management    Time 8   Period Weeks   Status On-going   PT LONG TERM GOAL #2   Title Will verbally understand ways to manage osteoporosis with diet and reduce strain postures on the spine    Time 8   Period Weeks   Status On-going   PT LONG TERM GOAL #3   Title Will verbally understand how to strengthen postural musculature    Time 8   Period Weeks   Status On-going   PT LONG TERM GOAL #4   Title Improve 5x sit to stand score to = or  < 15 seconds to decrease falls risk    Time 8   Period Weeks   Status On-going               Plan - 11/10/14 1052    Clinical Impression Statement Pt showed tremendous improvement with transfer sit to stand able to perform x 5 in 18 and 14 sec without or minimal UE support, at evaluation needed 23 sec with UE support. Pt will continue to benefit from skilled PT  to continue to improve endurance, strength and balance.    Pt will benefit from skilled therapeutic intervention in order to improve on the following deficits  Abnormal gait;Difficulty walking;Decreased endurance;Cardiopulmonary status limiting activity;Decreased activity tolerance;Decreased balance;Decreased strength   Rehab Potential  Good   PT Frequency 2x / week   PT Duration 8 weeks   PT Treatment/Interventions ADLs/Self Care Home Management;Energy conservation;Therapeutic exercise;Balance training;Moist Heat;Therapeutic activities;Neuromuscular re-education;Functional mobility training;Stair training;Gait training;Passive range of motion;Patient/family education;Orthotic Fit/Training;Manual techniques   PT Next Visit Plan Continue with UBE, may increase resistance on bike to 1 and try to do for 24mn on/1 min off   Consulted and Agree with Plan of Care Patient        Problem List Patient Active Problem List   Diagnosis Date Noted  . Dry eye 08/29/2013  . Chemosis 08/29/2013  . 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 Meibomian gland disease 07/07/2013  . Deformity of eyelid 07/07/2013  . Arthritis or polyarthritis, rheumatoid 07/07/2013  . Scar of eyelid 07/07/2013  . Diabetes 09/23/2012  . DYSPNEA 09/23/2012  . Hypoxemia 09/23/2012  . Awaiting organ transplant 09/23/2012  . Chronic respiratory failure 07/07/2011  . GERD 09/06/2009  . PULMONARY FIBROSIS 10/12/2008  . Cough 10/12/2008    Danell Verno Naumann-Houegnifio, PTA 11/10/2014 11:52 AM   [facility] [address] [place] [address]: [phone]  Fax:  [phone]  Physical Therapy Progress Note  Dates of Reporting Period: 10/03/2014 to 11/10/2014  Objective Reports of Subjective Statement: Patient is reporting weakness in her legs and difficulty with daily tasks.  Objective Measurements: Bilateral . Hips 3+/5, left knee extension 4+/5, and bilateral  shoulder strength for flexion and abduction 4/5. Sit to stand 5 times in 18 seconds.   Goal Update: Patient has met all of her STG's.  Patient has  not met LTG 3 1-5 due to learning about osteoporosis, what postures to avoid to protect her spine with osteoporosis, sit to stand in less seconds to decrease risk of falls, and posture with tasks.   Plan: Continue to increase strength of postural muscles, education on osteoporosis, and educate on HEP to increase her overall strength to decrease risk of falls.   Reason Skilled Services are Required: Patient needs to be monitored due to her on oxygen and has to wear a back brace due to osteoporosis.       CEarlie Counts PT 11/10/2014 12:23 PM

## 2014-11-13 ENCOUNTER — Ambulatory Visit: Payer: Medicare Other | Admitting: Physical Therapy

## 2014-11-13 ENCOUNTER — Encounter: Payer: Self-pay | Admitting: Physical Therapy

## 2014-11-13 DIAGNOSIS — M810 Age-related osteoporosis without current pathological fracture: Secondary | ICD-10-CM | POA: Diagnosis not present

## 2014-11-13 DIAGNOSIS — Z7409 Other reduced mobility: Secondary | ICD-10-CM

## 2014-11-13 NOTE — Therapy (Signed)
[facility] [address] [place], [place], [address] Phone: [phone]   Fax:  [phone]  Physical Therapy Treatment  Patient Details  Name: Donna Watts MRN: [id number] Date of Birth: 09-Mar-1930 Referring Provider:  Antony Blackbird, MD  Encounter Date: 11/13/2014      PT End of Session - 11/13/14 1302    Visit Number 11   Number of Visits 10   Date for PT Re-Evaluation 11/28/14   PT Start Time 1233   PT Stop Time 1312   PT Time Calculation (min) 39 min   Equipment Utilized During Treatment Oxygen   Activity Tolerance Patient tolerated treatment well   Behavior During Therapy [facility] for tasks assessed/performed      Past Medical History  Diagnosis Date  . Other and unspecified hyperlipidemia   . Esophageal reflux   . Pulmonary fibrosis   . Diabetes mellitus without complication   . Acid reflux   . Rheumatoid arthritis   . Cancer     Past Surgical History  Procedure Laterality Date  . Appendectomy      There were no vitals filed for this visit.  Visit Diagnosis:  Osteoporosis  Decreased mobility and endurance      Subjective Assessment - 11/13/14 1238    Subjective No new complaints today. Thinks she may have strained her abdominal muscle but not sure. She reports she is not officially complaining about it.   Currently in Pain? No/denies                         [facility] Adult PT Treatment/Exercise - 11/13/14 0001    Knee/Hip Exercises: Aerobic   Stationary Bike L1 x 7 min   Monitored O2 sat during, did pursed lip breathing during    Knee/Hip Exercises: Standing   Hip Flexion Stengthening;Both;1 set;10 reps   Hip Flexion Limitations Holding onto bar at TM   Forward Step Up Both;2 sets;10 reps;Hand Hold: 2   Forward Step Up Limitations At stairs, rets break in between for breathing   Knee/Hip Exercises: Seated   Long Arc Quad Strengthening;Both;1 set;Weights   Long Arc Quad Weight  3 lbs.   Long Arc Quad Limitations 8x bil   Abduction/Adduction  Strengthening;Both;2 sets   Abd/Adduction Limitations Adductor squeeze 12x 5 sec hold   Shoulder Exercises: Seated   Horizontal ABduction Strengthening;Both;5 reps;Theraband   Theraband Level (Shoulder Horizontal ABduction) Level 1 (Yellow)   Shoulder Exercises: ROM/Strengthening   UBE (Upper Arm Bike) L0 3 min reversing direction each min                  PT Short Term Goals - 11/13/14 1251    PT SHORT TERM GOAL #1   Title Initiate HEP focusing on managing osteoporosis postural deficits and strength deficits    Time 4   Period Weeks   Status Achieved   PT SHORT TERM GOAL #2   Title Will verbally understand correct body mechanics for home and work tasks to decrease strain on spine.    Time 4   Period Weeks   Status Achieved   PT SHORT TERM GOAL #3   Title Will decrease time for 5x sit to stand to < or = 23 seconds without use of UE    Time 4   Period Weeks   Status Achieved           PT Long Term Goals - 11/10/14 1102    PT  LONG TERM GOAL #1   Title Will verbally understand do's and don'ts of osteoporosis management    Time 8   Period Weeks   Status On-going   PT LONG TERM GOAL #2   Title Will verbally understand ways to manage osteoporosis with diet and reduce strain postures on the spine    Time 8   Period Weeks   Status On-going   PT LONG TERM GOAL #3   Title Will verbally understand how to strengthen postural musculature    Time 8   Period Weeks   Status On-going   PT LONG TERM GOAL #4   Title Improve 5x sit to stand score to = or  < 15 seconds to decrease falls risk    Time 8   Period Weeks   Status On-going               Plan - 11/13/14 1305    Clinical Impression Statement Pt did excellent throughout tx today. O2 consistently recovers after a short recovery time of less than 30 sec. Added a little  yellow band  for posture today. Pt was worried about a particular finger  but this was ok.  Goals were just assessed on FRiday so they are unchan   Pt will benefit from skilled therapeutic intervention in order to improve on the following deficits Abnormal gait;Difficulty walking;Decreased endurance;Cardiopulmonary status limiting activity;Decreased activity tolerance;Decreased balance;Decreased strength   Rehab Potential Good   PT Frequency 2x / week   PT Duration 8 weeks   PT Treatment/Interventions ADLs/Self Care Home Management;Energy conservation;Therapeutic exercise;Balance training;Moist Heat;Therapeutic activities;Neuromuscular re-education;Functional mobility training;Stair training;Gait training;Passive range of motion;Patient/family education;Orthotic Fit/Training;Manual techniques   PT Next Visit Plan Postural strength   Consulted and Agree with Plan of Care Patient        Problem List Patient Active Problem List   Diagnosis Date Noted  . Dry eye 08/29/2013  . Chemosis 08/29/2013  . 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 Meibomian gland disease 07/07/2013  . Deformity of eyelid 07/07/2013  . Arthritis or polyarthritis, rheumatoid 07/07/2013  . Scar of eyelid 07/07/2013  . Diabetes 09/23/2012  . DYSPNEA 09/23/2012  . Hypoxemia 09/23/2012  . Awaiting organ transplant 09/23/2012  . Chronic respiratory failure 07/07/2011  . GERD 09/06/2009  . PULMONARY FIBROSIS 10/12/2008  . Cough 10/12/2008    Leontine Radman, PTA 11/13/2014, 1:09 PM  [facility] [address], [place], [place], [address] Phone: [phone]   Fax:  [phone]

## 2014-11-15 ENCOUNTER — Ambulatory Visit: Payer: Medicare Other | Admitting: Physical Therapy

## 2014-11-15 ENCOUNTER — Encounter: Payer: Self-pay | Admitting: Physical Therapy

## 2014-11-15 DIAGNOSIS — M810 Age-related osteoporosis without current pathological fracture: Secondary | ICD-10-CM

## 2014-11-15 DIAGNOSIS — Z7409 Other reduced mobility: Secondary | ICD-10-CM

## 2014-11-15 DIAGNOSIS — R269 Unspecified abnormalities of gait and mobility: Secondary | ICD-10-CM

## 2014-11-15 NOTE — Therapy (Signed)
[facility] [address] [place], [place], [address] Phone: [phone]   Fax:  [phone]  Physical Therapy Treatment  Patient Details  Name: Donna Watts MRN: [id number] Date of Birth: 22-May-1929 Referring Provider:  Delrae Rend, MD  Encounter Date: 11/15/2014      PT End of Session - 11/15/14 1044    Visit Number 12   Number of Visits 20   Date for PT Re-Evaluation 11/28/14   PT Start Time 0932   PT Stop Time 1100   PT Time Calculation (min) 45 min   Equipment Utilized During Treatment Oxygen   Activity Tolerance Patient tolerated treatment well   Behavior During Therapy [facility] for tasks assessed/performed      Past Medical History  Diagnosis Date  . Other and unspecified hyperlipidemia   . Esophageal reflux   . Pulmonary fibrosis   . Diabetes mellitus without complication   . Acid reflux   . Rheumatoid arthritis   . Cancer     Past Surgical History  Procedure Laterality Date  . Appendectomy      There were no vitals filed for this visit.  Visit Diagnosis:  Decreased mobility and endurance  Osteoporosis  Abnormality of gait      Subjective Assessment - 11/15/14 1021    Subjective I feel good today, lots of energy.   Currently in Pain? No/denies                         [facility] Adult PT Treatment/Exercise - 11/15/14 0001    Knee/Hip Exercises: Aerobic   Stationary Bike L1x 8 min  Only 1 rest break   Knee/Hip Exercises: Standing   Forward Step Up Both;2 sets;10 reps;Hand Hold: 2   Forward Step Up Limitations At stairs, rets break in between for breathing   Knee/Hip Exercises: Seated   Long Arc Quad Strengthening;Both;1 set;Weights   Long Arc Quad Weight 3 lbs.   Long Arc [employer] Limitations 2x10x bil    Abd/Adduction Limitations Adductor squeez 2x15    Abd/Adduction Weights --  Abduction yellow band 2x 10   Shoulder Exercises: Seated   Horizontal ABduction  Strengthening;Both;10 reps;Theraband   Theraband Level (Shoulder Horizontal ABduction) Level 1 (Yellow)  Verbal cues to lift her spine tall   Shoulder Exercises: ROM/Strengthening   UBE (Upper Arm Bike) L0 3x3 rest inbetween pilllow to support spine                  PT Short Term Goals - 11/13/14 1251    PT SHORT TERM GOAL #1   Title Initiate HEP focusing on managing osteoporosis postural deficits and strength deficits    Time 4   Period Weeks   Status Achieved   PT SHORT TERM GOAL #2   Title Will verbally understand correct body mechanics for home and work tasks to decrease strain on spine.    Time 4   Period Weeks   Status Achieved   PT SHORT TERM GOAL #3   Title Will decrease time for 5x sit to stand to < or = 23 seconds without use of UE    Time 4   Period Weeks   Status Achieved           PT Long Term Goals - 11/10/14 1102    PT LONG TERM GOAL #1   Title Will verbally understand do's and don'ts of osteoporosis management    Time 8   Period Weeks  Status On-going   PT LONG TERM GOAL #2   Title Will verbally understand ways to manage osteoporosis with diet and reduce strain postures on the spine    Time 8   Period Weeks   Status On-going   PT LONG TERM GOAL #3   Title Will verbally understand how to strengthen postural musculature    Time 8   Period Weeks   Status On-going   PT LONG TERM GOAL #4   Title Improve 5x sit to stand score to = or  < 15 seconds to decrease falls risk    Time 8   Period Weeks   Status On-going               Plan - 11/15/14 1045    Clinical Impression Statement Pt increased work loads, reps, sets all this week with verbal reports of feeling "stronger" in everything she does. Her 02 is monitored throughout and consistenlty drops with standing or aerobic activiyt. This returns consistently to normal level with 30 sec rest break.    Pt will benefit from skilled therapeutic intervention in order to improve on the  following deficits Abnormal gait;Difficulty walking;Decreased endurance;Cardiopulmonary status limiting activity;Decreased activity tolerance;Decreased balance;Decreased strength   Rehab Potential Good   PT Frequency 2x / week   PT Duration 8 weeks   PT Treatment/Interventions ADLs/Self Care Home Management;Energy conservation;Therapeutic exercise;Balance training;Moist Heat;Therapeutic activities;Neuromuscular re-education;Functional mobility training;Stair training;Gait training;Passive range of motion;Patient/family education;Orthotic Fit/Training;Manual techniques   PT Next Visit Plan Postural strength   Consulted and Agree with Plan of Care Patient        Problem List Patient Active Problem List   Diagnosis Date Noted  . Dry eye 08/29/2013  . Chemosis 08/29/2013  . 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 Meibomian gland disease 07/07/2013  . Deformity of eyelid 07/07/2013  . Arthritis or polyarthritis, rheumatoid 07/07/2013  . Scar of eyelid 07/07/2013  . Diabetes 09/23/2012  . DYSPNEA 09/23/2012  . Hypoxemia 09/23/2012  . Awaiting organ transplant 09/23/2012  . Chronic respiratory failure 07/07/2011  . GERD 09/06/2009  . PULMONARY FIBROSIS 10/12/2008  . Cough 10/12/2008    Ellenie Salome, PTA 11/15/2014, 10:52 AM  [facility] [address], [place], [place], [address] Phone: [phone]   Fax:  [phone]

## 2014-11-20 ENCOUNTER — Encounter: Payer: Self-pay | Admitting: Physical Therapy

## 2014-11-20 ENCOUNTER — Ambulatory Visit: Payer: Medicare Other | Admitting: Physical Therapy

## 2014-11-20 DIAGNOSIS — R269 Unspecified abnormalities of gait and mobility: Secondary | ICD-10-CM

## 2014-11-20 DIAGNOSIS — M810 Age-related osteoporosis without current pathological fracture: Secondary | ICD-10-CM

## 2014-11-20 DIAGNOSIS — Z7409 Other reduced mobility: Secondary | ICD-10-CM

## 2014-11-20 NOTE — Patient Instructions (Signed)
DO's and DON'T's   Avoid and/or Minimize positions of forward bending ( flexion)  Side bending and rotation of the trunk  Especially when movements occur together   When your back aches:   Don't sit down   Lie down on your back with a small pillow under your head and one under your knees or as outlined by our therapist. Or, lie in the 90/90 position ( on the floor with your feet and legs on the sofa with knees and hips bent to 90 degrees)  Tying or putting on your shoes:   Don't bend over to tie your shoes or put on socks.  Instead, bring one foot up, cross it over the opposite knee and bend forward (hinge) at the hips to so the task.  Keep your back straight.  If you cannot do this safely, then you need to use long handled assistive devices such as a shoehorn and sock puller.  Exercising:  Don't engage in ballistic types of exercise routines such as high-impact aerobics or jumping rope  Don't do exercises in the gym that bring you forward (abdominal crunches, sit-ups, touching your  toes, knee-to-chest, straight leg raising.)  Follow a regular exercise program that includes a variety of different weight-bearing activities, such as low-impact aerobics, T' ai chi or walking as your physical therapist advises  Do exercises that emphasize return to normal body alignment and strengthening of the muscles that keep your back straight, as outlined in this program or by your therapist  Household tasks:  Don't reach unnecessarily or twist your trunk when mopping, sweeping, vacuuming, raking, making beds, weeding gardens, getting objects ou of cupboards, etc.  Keep your broom, mop, vacuum, or rake close to you and mover your whole body as you move them. Walk over to the area on which you are working. Arrange kitchen, bathroom, and bedroom shelves so that frequently used items may be reached without excessive bending, twisting, and reaching.  Use a  sturdy stool if necessary.  Don't bend from the waist to pick up something up  Off the floor, out of the trunk of your car, or to brush your teeth, wash your face, etc.   Bend at the knees, keeping back straight as possible. Use a reacher if necessary.   Prevention of fracture is the so-called "BOTTOm -Line" in the management of OSTEOPOROSIS. Do not take unnecessary chances in movement. Once a compression fracture occurs, the process is very difficult to control; one fracture is frequently followed by many more.

## 2014-11-20 NOTE — Therapy (Signed)
[facility] [address], [place], [place], [address] Phone: [phone]   Fax:  [phone]  Physical Therapy Treatment  Patient Details  Name: Donna Watts MRN: [id number] Date of Birth: Oct 29, 1929 Referring Provider:  Delrae Rend, MD  Encounter Date: 11/20/2014      PT End of Session - 11/20/14 1214    Visit Number 13   Number of Visits 20   Date for PT Re-Evaluation 11/28/14   PT Start Time 6962   PT Stop Time 1227   PT Time Calculation (min) 33 min   Equipment Utilized During Treatment Oxygen   Activity Tolerance Patient tolerated treatment well   Behavior During Therapy [facility] for tasks assessed/performed      Past Medical History  Diagnosis Date  . Other and unspecified hyperlipidemia   . Esophageal reflux   . Pulmonary fibrosis   . Diabetes mellitus without complication   . Acid reflux   . Rheumatoid arthritis   . Cancer     Past Surgical History  Procedure Laterality Date  . Appendectomy      There were no vitals filed for this visit.  Visit Diagnosis:  Decreased mobility and endurance  Osteoporosis  Abnormality of gait      Subjective Assessment - 11/20/14 1155    Subjective Has a small hand brace on her left hand to assist in keeping a finger straight.  My legs are feeling stronger.    Currently in Pain? No/denies                         [facility] Adult PT Treatment/Exercise - 11/20/14 0001    Knee/Hip Exercises: Aerobic   Stationary Bike L1 x 8 min   Knee/Hip Exercises: Seated   Long Arc Quad Strengthening;Both;2 sets;10 reps   Long Arc Quad Weight 3 lbs.   Abd/Adduction Limitations Adductor squeez 2x15    Abd/Adduction Weights --  Red band abd 3x 10   Sit to Sand --  2x 8 no arms, all LE                PT Education - 11/20/14 1205    Education provided Yes   Education Details [employer] and dont's   Person(s) Educated Patient   Methods Explanation    Comprehension Verbalized understanding          PT Short Term Goals - 11/13/14 1251    PT SHORT TERM GOAL #1   Title Initiate HEP focusing on managing osteoporosis postural deficits and strength deficits    Time 4   Period Weeks   Status Achieved   PT SHORT TERM GOAL #2   Title Will verbally understand correct body mechanics for home and work tasks to decrease strain on spine.    Time 4   Period Weeks   Status Achieved   PT SHORT TERM GOAL #3   Title Will decrease time for 5x sit to stand to < or = 23 seconds without use of UE    Time 4   Period Weeks   Status Achieved           PT Long Term Goals - 11/10/14 1102    PT LONG TERM GOAL #1   Title Will verbally understand do's and don'ts of osteoporosis management    Time 8   Period Weeks   Status On-going   PT LONG TERM GOAL #2   Title Will verbally understand ways to manage  osteoporosis with diet and reduce strain postures on the spine    Time 8   Period Weeks   Status On-going   PT LONG TERM GOAL #3   Title Will verbally understand how to strengthen postural musculature    Time 8   Period Weeks   Status On-going   PT LONG TERM GOAL #4   Title Improve 5x sit to stand score to = or  < 15 seconds to decrease falls risk    Time 8   Period Weeks   Status On-going               Plan - 11/20/14 1215    Clinical Impression Statement Pt 10 min late for tx today. She reports overall her legs are feeling stronger but she might be sitting too much at home. We educated her in the [employer] and don'ts today which she understood with discussion of them. She is scheduled for reassessment next visit.    Pt will benefit from skilled therapeutic intervention in order to improve on the following deficits Abnormal gait;Difficulty walking;Decreased endurance;Cardiopulmonary status limiting activity;Decreased activity tolerance;Decreased balance;Decreased strength   Rehab Potential Good   PT Frequency 2x / week   PT  Duration 8 weeks   PT Treatment/Interventions ADLs/Self Care Home Management;Energy conservation;Therapeutic exercise;Balance training;Moist Heat;Therapeutic activities;Neuromuscular re-education;Functional mobility training;Stair training;Gait training;Passive range of motion;Patient/family education;Orthotic Fit/Training;Manual techniques   PT Next Visit Plan ERO next   Consulted and Agree with Plan of Care Patient        Problem List Patient Active Problem List   Diagnosis Date Noted  . Dry eye 08/29/2013  . Chemosis 08/29/2013  . 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 Meibomian gland disease 07/07/2013  . Deformity of eyelid 07/07/2013  . Arthritis or polyarthritis, rheumatoid 07/07/2013  . Scar of eyelid 07/07/2013  . Diabetes 09/23/2012  . DYSPNEA 09/23/2012  . Hypoxemia 09/23/2012  . Awaiting organ transplant 09/23/2012  . Chronic respiratory failure 07/07/2011  . GERD 09/06/2009  . PULMONARY FIBROSIS 10/12/2008  . Cough 10/12/2008    Baxter Gonzalez, PTA 11/20/2014, 12:19 PM  [facility] [address] [place], [place], [address] Phone: [phone]   Fax:  [phone]     PT's O2 levels were monitored throughout tx. Only on the bike did it ever drop to 88%. This returned back to 95% within 10-15 sec of breathing.

## 2014-11-22 ENCOUNTER — Ambulatory Visit: Payer: Medicare Other | Admitting: Physical Therapy

## 2014-11-22 ENCOUNTER — Encounter: Payer: Self-pay | Admitting: Physical Therapy

## 2014-11-22 DIAGNOSIS — Z7409 Other reduced mobility: Secondary | ICD-10-CM

## 2014-11-22 DIAGNOSIS — M810 Age-related osteoporosis without current pathological fracture: Secondary | ICD-10-CM | POA: Diagnosis not present

## 2014-11-22 DIAGNOSIS — R269 Unspecified abnormalities of gait and mobility: Secondary | ICD-10-CM

## 2014-11-22 NOTE — Therapy (Signed)
[facility] [address] [place], [place], [address] Phone: [phone]   Fax:  [phone]  Physical Therapy Treatment  Patient Details  Name: Donna Watts MRN: [id number] Date of Birth: Aug 02, 1929 Referring Provider:  Delrae Rend, MD  Encounter Date: 11/22/2014      PT End of Session - 11/22/14 1117    Visit Number 14   Number of Visits 20   Date for PT Re-Evaluation 11/28/14   PT Start Time 1106   PT Stop Time 1145   PT Time Calculation (min) 39 min   Equipment Utilized During Treatment Oxygen   Activity Tolerance Patient tolerated treatment well   Behavior During Therapy [facility] for tasks assessed/performed      Past Medical History  Diagnosis Date  . Other and unspecified hyperlipidemia   . Esophageal reflux   . Pulmonary fibrosis   . Diabetes mellitus without complication   . Acid reflux   . Rheumatoid arthritis   . Cancer     Past Surgical History  Procedure Laterality Date  . Appendectomy      There were no vitals filed for this visit.  Visit Diagnosis:  Decreased mobility and endurance  Osteoporosis  Abnormality of gait      Subjective Assessment - 11/22/14 1109    Subjective Feeling good this morning. No complaints.   Currently in Pain? No/denies                         [facility] Adult PT Treatment/Exercise - 11/22/14 0001    Knee/Hip Exercises: Aerobic   Stationary Bike L1 x 8 min 1 rest break near end for breathing   Knee/Hip Exercises: Standing   Forward Step Up Both;2 sets;10 reps;Hand Hold: 2  at stairs   Knee/Hip Exercises: Seated   Long Arc Quad Strengthening;Both;3 sets;10 reps   Long Arc Quad Weight 3 lbs.   Abd/Adduction Limitations Adductor squeeze 2x20   Abd/Adduction Weights --  Red band hip abd 3x10   Sit to Sand 2 sets;10 reps;without UE support  Pt requires break in between to get O2 back. Recovered   Shoulder Exercises: Seated   Horizontal  ABduction Strengthening;Both;20 reps   Theraband Level (Shoulder Horizontal ABduction) Level 1 (Yellow)                  PT Short Term Goals - 11/13/14 1251    PT SHORT TERM GOAL #1   Title Initiate HEP focusing on managing osteoporosis postural deficits and strength deficits    Time 4   Period Weeks   Status Achieved   PT SHORT TERM GOAL #2   Title Will verbally understand correct body mechanics for home and work tasks to decrease strain on spine.    Time 4   Period Weeks   Status Achieved   PT SHORT TERM GOAL #3   Title Will decrease time for 5x sit to stand to < or = 23 seconds without use of UE    Time 4   Period Weeks   Status Achieved           PT Long Term Goals - 11/22/14 1118    PT LONG TERM GOAL #1   Title Will verbally understand do's and don'ts of osteoporosis management    Time 8   Period Weeks   Status Achieved   PT LONG TERM GOAL #2   Title Will verbally understand ways to manage osteoporosis with diet and  reduce strain postures on the spine    Time 8   Period Weeks   Status Achieved   PT LONG TERM GOAL #3   Title Will verbally understand how to strengthen postural musculature    Time 8   Period Weeks   Status On-going  Continue to build and learn   PT LONG TERM GOAL #4   Title Improve 5x sit to stand score to = or  < 15 seconds to decrease falls risk    Time 8   Period Weeks   Status On-going               Plan - 11/22/14 1129    Clinical Impression Statement Pt understands osteo principles by using her son at home to perform any activity that requires bend over. She continues to build the volume of activity in clinic which demonstrates her improved endurance.    Pt will benefit from skilled therapeutic intervention in order to improve on the following deficits Abnormal gait;Difficulty walking;Decreased endurance;Cardiopulmonary status limiting activity;Decreased activity tolerance;Decreased balance;Decreased strength   Rehab  Potential Good   PT Frequency 2x / week   PT Duration 8 weeks   PT Treatment/Interventions ADLs/Self Care Home Management;Energy conservation;Therapeutic exercise;Balance training;Moist Heat;Therapeutic activities;Neuromuscular re-education;Functional mobility training;Stair training;Gait training;Passive range of motion;Patient/family education;Orthotic Fit/Training;Manual techniques   PT Next Visit Plan ERO is actually next week. Pt would like to continue.   Consulted and Agree with Plan of Care Patient        Problem List Patient Active Problem List   Diagnosis Date Noted  . Dry eye 08/29/2013  . Chemosis 08/29/2013  . 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 Meibomian gland disease 07/07/2013  . Deformity of eyelid 07/07/2013  . Arthritis or polyarthritis, rheumatoid 07/07/2013  . Scar of eyelid 07/07/2013  . Diabetes 09/23/2012  . DYSPNEA 09/23/2012  . Hypoxemia 09/23/2012  . Awaiting organ transplant 09/23/2012  . Chronic respiratory failure 07/07/2011  . GERD 09/06/2009  . PULMONARY FIBROSIS 10/12/2008  . Cough 10/12/2008    Sammy Cassar, PTA 11/22/2014, 11:38 AM  [facility] [address], [place], [place], [address] Phone: [phone]   Fax:  [phone]

## 2014-11-27 ENCOUNTER — Ambulatory Visit: Payer: Medicare Other | Attending: Internal Medicine

## 2014-11-27 DIAGNOSIS — R269 Unspecified abnormalities of gait and mobility: Secondary | ICD-10-CM | POA: Diagnosis present

## 2014-11-27 DIAGNOSIS — M810 Age-related osteoporosis without current pathological fracture: Secondary | ICD-10-CM

## 2014-11-27 DIAGNOSIS — Z7409 Other reduced mobility: Secondary | ICD-10-CM | POA: Diagnosis present

## 2014-11-27 NOTE — Therapy (Signed)
[facility] [address] [place], [place], [address] Phone: [phone]   Fax:  [phone]  Physical Therapy Treatment  Patient Details  Name: Donna Watts MRN: [id number] Date of Birth: Feb 03, 1930 Referring Provider:  Delrae Rend, MD  Encounter Date: 11/27/2014      PT End of Session - 11/27/14 1229    Visit Number 15   Number of Visits 20   Date for PT Re-Evaluation 12/22/14   PT Start Time 2353   PT Stop Time 1227   PT Time Calculation (min) 42 min   Equipment Utilized During Treatment Oxygen   Activity Tolerance Patient tolerated treatment well   Behavior During Therapy [facility] for tasks assessed/performed      Past Medical History  Diagnosis Date  . Other and unspecified hyperlipidemia   . Esophageal reflux   . Pulmonary fibrosis   . Diabetes mellitus without complication   . Acid reflux   . Rheumatoid arthritis   . Cancer     Past Surgical History  Procedure Laterality Date  . Appendectomy      There were no vitals filed for this visit.  Visit Diagnosis:  Decreased mobility and endurance - Plan: PT plan of care cert/re-cert  Osteoporosis - Plan: PT plan of care cert/re-cert  Abnormality of gait - Plan: PT plan of care cert/re-cert      Subjective Assessment - 11/27/14 1148    Subjective PT is really helping.  Endurance and strength have improved with exercises.    Currently in Pain? No/denies            [facility] PT Assessment - 11/27/14 0001    Assessment   Medical Diagnosis M81.0 - Osteoporosis; R29.6 - Falls Frequently    Onset Date/Surgical Date 09/02/14   Next MD Visit 12/25/14   Precautions   Precautions Fall;Other (comment)  depends on O2 8Ltr with activities, 6Ltr with rest,     Precaution Comments T score: -3.6, hx of CA   Required Braces or Orthoses Spinal Brace   Spinal Brace Thoracolumbosacral orthotic   Prior Function   Level of Independence Independent with basic ADLs    Vocation Retired   Associate Professor   Overall Cognitive Status Within Functional Limits for tasks assessed   ROM / Strength   AROM / PROM / Strength Strength   Strength   Overall Strength Deficits   Overall Strength Comments Bil. shoulder flexion/abd: 4/5; all other motions 5/5. Bil hip  strength: 4-/5; Rt. knee extension: 4+/5; all other LE strength 5/5    Transfers   Five time sit to stand comments  14 seconds                     [facility] Adult PT Treatment/Exercise - 11/27/14 0001    Knee/Hip Exercises: Aerobic   Nustep Level 1, seat 5, arms 3, 10 minutes   Knee/Hip Exercises: Standing   Forward Step Up Both;2 sets;10 reps;Hand Hold: 2  at stairs   Knee/Hip Exercises: Seated   Long Arc Quad Strengthening;Both;3 sets;10 reps   Long Arc Quad Weight 3 lbs.   Marching Limitations seated 2x10 each   Marching Weights 3 lbs.   Abd/Adduction Limitations Adductor squeeze 2x20   Shoulder Exercises: Seated   Flexion Both;20 reps   Flexion Weight (lbs) 1                  PT Short Term Goals - 11/13/14 1251    PT SHORT TERM  GOAL #1   Title Initiate HEP focusing on managing osteoporosis postural deficits and strength deficits    Time 4   Period Weeks   Status Achieved   PT SHORT TERM GOAL #2   Title Will verbally understand correct body mechanics for home and work tasks to decrease strain on spine.    Time 4   Period Weeks   Status Achieved   PT SHORT TERM GOAL #3   Title Will decrease time for 5x sit to stand to < or = 23 seconds without use of UE    Time 4   Period Weeks   Status Achieved           PT Long Term Goals - 11/27/14 1151    PT LONG TERM GOAL #1   Title Will verbally understand do's and don'ts of osteoporosis management    Status Achieved   PT LONG TERM GOAL #2   Title Will verbally understand ways to manage osteoporosis with diet and reduce strain postures on the spine    Status Achieved   PT LONG TERM GOAL #3   Title Will verbally  understand how to strengthen postural musculature    Time 8   Period Weeks   Status On-going  continue to build on HEP   PT LONG TERM GOAL #4   Title Improve 5x sit to stand score to = or  < 15 seconds to decrease falls risk    Time 4   Period Weeks   Status On-going  22 seconds, then 14 seconds   PT LONG TERM GOAL #5   Title improve strength to report 50% improvement in endurance with standing and walking tasks   Time 4   Period Weeks   Status New               Plan - 11/27/14 1205    Clinical Impression Statement Pt has improved endurance to do NuStep for 10 minutes today with 1 short rest break. Pt is independent with osteoporosis modifications to reduce stress on spine.  Pt continues to demonstrate weakness in the upper and lower body that limits her endurance for home tasks.  Pt will benfit from PT for endurance, strength and balance improvements.     Pt will benefit from skilled therapeutic intervention in order to improve on the following deficits Abnormal gait;Difficulty walking;Decreased endurance;Cardiopulmonary status limiting activity;Decreased activity tolerance;Decreased balance;Decreased strength   Rehab Potential Good   PT Frequency 2x / week   PT Duration 4 weeks   PT Treatment/Interventions ADLs/Self Care Home Management;Energy conservation;Therapeutic exercise;Balance training;Moist Heat;Therapeutic activities;Neuromuscular re-education;Functional mobility training;Stair training;Gait training;Passive range of motion;Patient/family education;Orthotic Fit/Training;Manual techniques   PT Next Visit Plan UE/LE strength, balance and endurance training.   Consulted and Agree with Plan of Care Patient        Problem List Patient Active Problem List   Diagnosis Date Noted  . Dry eye 08/29/2013  . Chemosis 08/29/2013  . 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 Meibomian gland disease 07/07/2013  . Deformity of eyelid 07/07/2013  . Arthritis or  polyarthritis, rheumatoid 07/07/2013  . Scar of eyelid 07/07/2013  . Diabetes 09/23/2012  . DYSPNEA 09/23/2012  . Hypoxemia 09/23/2012  . Awaiting organ transplant 09/23/2012  . Chronic respiratory failure 07/07/2011  . GERD 09/06/2009  . PULMONARY FIBROSIS 10/12/2008  . Cough 10/12/2008    TAKACS,KELLY, PT 11/27/2014, 12:30 PM  [facility] [address], [place], [place], [address] Phone: [phone]  Fax:  [phone]

## 2014-11-30 ENCOUNTER — Ambulatory Visit: Payer: Medicare Other

## 2014-11-30 DIAGNOSIS — Z7409 Other reduced mobility: Secondary | ICD-10-CM

## 2014-11-30 DIAGNOSIS — M810 Age-related osteoporosis without current pathological fracture: Secondary | ICD-10-CM

## 2014-11-30 NOTE — Therapy (Signed)
[facility] [address] [place], [place], [address] Phone: [phone]   Fax:  [phone]  Physical Therapy Treatment  Patient Details  Name: Donna Watts MRN: [id number] Date of Birth: Jun 08, 1929 Referring Provider:  Antony Blackbird, MD  Encounter Date: 11/30/2014      PT End of Session - 11/30/14 1008    Visit Number 16   Number of Visits 20   Date for PT Re-Evaluation 12/22/14   PT Start Time 0931   PT Stop Time 1013   PT Time Calculation (min) 42 min   Equipment Utilized During Treatment Oxygen   Activity Tolerance Patient tolerated treatment well   Behavior During Therapy [facility] for tasks assessed/performed      Past Medical History  Diagnosis Date  . Other and unspecified hyperlipidemia   . Esophageal reflux   . Pulmonary fibrosis   . Diabetes mellitus without complication   . Acid reflux   . Rheumatoid arthritis   . Cancer     Past Surgical History  Procedure Laterality Date  . Appendectomy      Filed Vitals:   11/30/14 0945  SpO2: 93%    Visit Diagnosis:  Decreased mobility and endurance  Osteoporosis      Subjective Assessment - 11/30/14 0939    Subjective Pt really likes the NuStep.  Pt walked with her son outside yesterday for 15 minutes and didn't need to rest.     Currently in Pain? No/denies                         [facility] Adult PT Treatment/Exercise - 11/30/14 0001    Exercises   Exercises Ankle   Knee/Hip Exercises: Aerobic   Nustep Level 2, seat 5, arms 3, 10 minutes   Knee/Hip Exercises: Standing   Hip Extension Stengthening;2 sets;10 reps  also hip abduction 2x10   Extension Limitations standing on blue thraband pods for increased challenge with abduction and extension   Knee/Hip Exercises: Seated   Long Arc Quad Strengthening;Both;3 sets;10 reps   Long Arc Quad Weight 3 lbs.   Marching Limitations seated 2x10 each   Marching Weights 3 lbs.   Abd/Adduction Limitations Adductor squeeze 2x20   Shoulder Exercises: Seated   Flexion Both;20 reps   Flexion Weight (lbs) 2   Ankle Exercises: Standing   Rocker Board 3 minutes   Heel Raises 20 reps                  PT Short Term Goals - 11/13/14 1251    PT SHORT TERM GOAL #1   Title Initiate HEP focusing on managing osteoporosis postural deficits and strength deficits    Time 4   Period Weeks   Status Achieved   PT SHORT TERM GOAL #2   Title Will verbally understand correct body mechanics for home and work tasks to decrease strain on spine.    Time 4   Period Weeks   Status Achieved   PT SHORT TERM GOAL #3   Title Will decrease time for 5x sit to stand to < or = 23 seconds without use of UE    Time 4   Period Weeks   Status Achieved           PT Long Term Goals - 11/27/14 1151    PT LONG TERM GOAL #1   Title Will verbally understand do's and don'ts of osteoporosis management    Status Achieved  PT LONG TERM GOAL #2   Title Will verbally understand ways to manage osteoporosis with diet and reduce strain postures on the spine    Status Achieved   PT LONG TERM GOAL #3   Title Will verbally understand how to strengthen postural musculature    Time 8   Period Weeks   Status On-going  continue to build on HEP   PT LONG TERM GOAL #4   Title Improve 5x sit to stand score to = or  < 15 seconds to decrease falls risk    Time 4   Period Weeks   Status On-going  22 seconds, then 14 seconds   PT LONG TERM GOAL #5   Title improve strength to report 50% improvement in endurance with standing and walking tasks   Time 4   Period Weeks   Status New               Plan - 11/30/14 9407    Clinical Impression Statement Pt is buidling endurance and reports that she could walk 15 minutes yesterday without rest.  Pt increased weights to 2# with shoulder flexion and incresaed to level 2 on the NuStep.  Pt with cotinued UE/LE strength and endurance deficits that  limit her endurnace for home tasks.  Pt will benefit from PT for endurance, strength and balance improvements.     Pt will benefit from skilled therapeutic intervention in order to improve on the following deficits Abnormal gait;Difficulty walking;Decreased endurance;Cardiopulmonary status limiting activity;Decreased activity tolerance;Decreased balance;Decreased strength   Rehab Potential Good   PT Frequency 2x / week   PT Duration 4 weeks   PT Treatment/Interventions ADLs/Self Care Home Management;Energy conservation;Therapeutic exercise;Balance training;Moist Heat;Therapeutic activities;Neuromuscular re-education;Functional mobility training;Stair training;Gait training;Passive range of motion;Patient/family education;Orthotic Fit/Training;Manual techniques   PT Next Visit Plan UE/LE strength, balance and endurance training.   Consulted and Agree with Plan of Care Patient        Problem List Patient Active Problem List   Diagnosis Date Noted  . Dry eye 08/29/2013  . Chemosis 08/29/2013  . 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 Meibomian gland disease 07/07/2013  . Deformity of eyelid 07/07/2013  . Arthritis or polyarthritis, rheumatoid 07/07/2013  . Scar of eyelid 07/07/2013  . Diabetes 09/23/2012  . DYSPNEA 09/23/2012  . Hypoxemia 09/23/2012  . Awaiting organ transplant 09/23/2012  . Chronic respiratory failure 07/07/2011  . GERD 09/06/2009  . PULMONARY FIBROSIS 10/12/2008  . Cough 10/12/2008    TAKACS,KELLY, PT 11/30/2014, 10:10 AM  [facility] [address], [place], [place], [address] Phone: [phone]   Fax:  [phone]

## 2014-12-04 ENCOUNTER — Ambulatory Visit: Payer: Medicare Other

## 2014-12-04 DIAGNOSIS — M810 Age-related osteoporosis without current pathological fracture: Secondary | ICD-10-CM

## 2014-12-04 DIAGNOSIS — Z7409 Other reduced mobility: Secondary | ICD-10-CM

## 2014-12-04 NOTE — Therapy (Signed)
[facility] [address] [place], [place], [address] Phone: [phone]   Fax:  [phone]  Physical Therapy Treatment  Patient Details  Name: [name] MRN: [id number] Date of Birth: [birth year] Referring Provider:  Delrae Rend, MD  Encounter Date: 12/04/2014      PT End of Session - 12/04/14 1308    Visit Number 17   Number of Visits 20  Medicare   Date for PT Re-Evaluation 12/22/14   PT Start Time 1233   PT Stop Time 1313   PT Time Calculation (min) 40 min   Equipment Utilized During Treatment Oxygen   Activity Tolerance Patient tolerated treatment well   Behavior During Therapy [facility] for tasks assessed/performed      Past Medical History  Diagnosis Date  . Other and unspecified hyperlipidemia   . Esophageal reflux   . Pulmonary fibrosis   . Diabetes mellitus without complication   . Acid reflux   . Rheumatoid arthritis   . Cancer     Past Surgical History  Procedure Laterality Date  . Appendectomy      There were no vitals filed for this visit.  Visit Diagnosis:  Decreased mobility and endurance  Osteoporosis      Subjective Assessment - 12/04/14 1242    Subjective It have a harder time walking outside when the humidity is high due to breathing difficulty   Currently in Pain? No/denies                         [facility] Adult PT Treatment/Exercise - 12/04/14 0001    Knee/Hip Exercises: Aerobic   Nustep Level 2, seat 5, arms 3, 10 minutes   Knee/Hip Exercises: Standing   Hip Extension Stengthening;2 sets;10 reps  also hip abduction 2x10   Extension Limitations standing on blue thraband pods for increased challenge with abduction and extension   Knee/Hip Exercises: Seated   Long Arc Quad Strengthening;Both;3 sets;10 reps   Long Arc Quad Weight 3 lbs.   Marching Limitations seated 2x10 each   Marching Weights 3 lbs.   Abd/Adduction Limitations Adductor squeeze 2x20   Shoulder Exercises: Seated   Flexion Both;20 reps   Flexion Weight (lbs) 2   Ankle Exercises: Standing   Rocker Board 3 minutes                  PT Short Term Goals - 11/13/14 1251    PT SHORT TERM GOAL #1   Title Initiate HEP focusing on managing osteoporosis postural deficits and strength deficits    Time 4   Period Weeks   Status Achieved   PT SHORT TERM GOAL #2   Title Will verbally understand correct body mechanics for home and work tasks to decrease strain on spine.    Time 4   Period Weeks   Status Achieved   PT SHORT TERM GOAL #3   Title Will decrease time for 5x sit to stand to < or = 23 seconds without use of UE    Time 4   Period Weeks   Status Achieved           PT Long Term Goals - 12/04/14 1244    PT LONG TERM GOAL #1   Title Will verbally understand do's and don'ts of osteoporosis management    Status Achieved   PT LONG TERM GOAL #2   Title Will verbally understand ways to manage osteoporosis with diet and reduce strain postures  on the spine    Status Achieved   PT LONG TERM GOAL #3   Title Will verbally understand how to strengthen postural musculature    Time 8   Period Weeks   Status On-going  continue to build HEP   PT LONG TERM GOAL #5   Title improve strength to report 50% improvement in endurance with standing and walking tasks   Time 4   Period Weeks   Status On-going  depends on weather right now               Plan - 12/04/14 1306    Clinical Impression Statement Pt with fatigue today due to high humidity today and breathing difficulty.  Pt tolerated all exercise today with O2 95-97% after exercise.  Pt with continued UE/LE stregnth and endurance deficits that limit her endurance for home tasks.  Pt will benefit from PT for endurance, strength and balance improvements.     Pt will benefit from skilled therapeutic intervention in order to improve on the following deficits Abnormal gait;Difficulty walking;Decreased  endurance;Cardiopulmonary status limiting activity;Decreased activity tolerance;Decreased balance;Decreased strength   Rehab Potential Good   PT Frequency 2x / week   PT Duration 4 weeks   PT Treatment/Interventions ADLs/Self Care Home Management;Energy conservation;Therapeutic exercise;Balance training;Moist Heat;Therapeutic activities;Neuromuscular re-education;Functional mobility training;Stair training;Gait training;Passive range of motion;Patient/family education;Orthotic Fit/Training;Manual techniques   PT Next Visit Plan UE/LE strength, balance and endurance training.   Consulted and Agree with Plan of Care Patient        Problem List Patient Active Problem List   Diagnosis Date Noted  . Dry eye 08/29/2013  . Chemosis 08/29/2013  . 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 Meibomian gland disease 07/07/2013  . Deformity of eyelid 07/07/2013  . Arthritis or polyarthritis, rheumatoid 07/07/2013  . Scar of eyelid 07/07/2013  . Diabetes 09/23/2012  . DYSPNEA 09/23/2012  . Hypoxemia 09/23/2012  . Awaiting organ transplant 09/23/2012  . Chronic respiratory failure 07/07/2011  . GERD 09/06/2009  . PULMONARY FIBROSIS 10/12/2008  . Cough 10/12/2008    TAKACS,KELLY, PT 12/04/2014, 1:12 PM  [facility] [address] [place], [place], [address] Phone: [phone]   Fax:  [phone]

## 2014-12-06 ENCOUNTER — Ambulatory Visit: Payer: Medicare Other

## 2014-12-06 ENCOUNTER — Ambulatory Visit (INDEPENDENT_AMBULATORY_CARE_PROVIDER_SITE_OTHER): Payer: Medicare Other | Admitting: Adult Health

## 2014-12-06 ENCOUNTER — Encounter: Payer: Self-pay | Admitting: Adult Health

## 2014-12-06 VITALS — BP 128/74 | HR 84 | Temp 98.1°F | Ht 59.0 in | Wt 111.0 lb

## 2014-12-06 DIAGNOSIS — J209 Acute bronchitis, unspecified: Secondary | ICD-10-CM

## 2014-12-06 DIAGNOSIS — Z7409 Other reduced mobility: Secondary | ICD-10-CM | POA: Diagnosis not present

## 2014-12-06 DIAGNOSIS — J9611 Chronic respiratory failure with hypoxia: Secondary | ICD-10-CM

## 2014-12-06 DIAGNOSIS — J8410 Pulmonary fibrosis, unspecified: Secondary | ICD-10-CM

## 2014-12-06 DIAGNOSIS — M810 Age-related osteoporosis without current pathological fracture: Secondary | ICD-10-CM

## 2014-12-06 MED ORDER — FLUTICASONE PROPIONATE 50 MCG/ACT NA SUSP: 2.0000 | Freq: Every day | NASAL | Status: DC

## 2014-12-06 MED ORDER — AZITHROMYCIN 250 MG PO TABS: ORAL_TABLET | ORAL | Status: AC

## 2014-12-06 MED ORDER — PREDNISONE 20 MG PO TABS: 20.0000 mg | ORAL_TABLET | Freq: Every day | ORAL | Status: DC

## 2014-12-06 NOTE — Progress Notes (Signed)
Subjective:    Patient ID: Donna Watts, female    DOB: 19-Feb-1930, 81 y.o.   MRN: [id number]  HPI  HPI  PCP FULP, CAMMIE, MD Rheum: Dr Gavin Pound  HPI  #RA-ILD/UIP pattern  -  h/o cough onset around 2005 with a cxr showing scarring per Dr Inda Merlin office records c/w PF -  - Diagnosed RA with arthritis and ? EN summer 2014  - First CT chest June 2010: R> L UIP pattern  -Followup CT chest June 2014 - R > L UIP pattern much worse   - - PFT's November 09, 2008 VC 2.03 ( 92%) DLC0 52%  - PFT's October 25, 2009 VC 2.0 (92%)   DLC0 58%  -PFT's  09/25/10            VC1.88 (86%)  DLCO 54 - Ex desat documented 11/09/08 x 1 lap > 84% p 2 laps October 25, 2009 > 84% p one lap 08/27/2010     > 87 p 2 laps RA 09/25/2010 > 83% p 2 laps at a very rapid pace 12/02/10 rec pace or wear 02, wants to try pacing - 07/04/2011   Walked RA x one lap @ 185 stopped due to  sats 87% at nl pace > 2lpm - 08/29/2011   Walked RA x one lap @ 185 stopped due to  desat to 88, improved but not eliminated on 2lpm so rec use 3lpm with ex - - PFT May 2014: FVC 1.3L/67%, DLCO 6.4/36%   - > started on CellCept in March 2015. [name] May 2015: FVC of 1.34/71% - PFT August 2015: VC 1.3. The/69%. FEV1 1.2 L/87%. Ratio of 92/125%. DLCO of 6.4/36%.    - Rx   - PPI since 2010  - Immuran/Pred since 2014   - O2 dependent x ? 2012     - Jan 2015: 3L at rest, 5L with exertion, 8L uphill   - started on CellCept in March 2015.    .................................................. OV 09/12/2013  Chief Complaint  Patient presents with  . Follow-up    Pt states her breathing has improved. C/o DOE, intermittent productive cough with white mucous. Denies CP.    Followup interstitial lung disease UIP pattern secondary to rheumatoid arthritis  Since last visit and her 2015 she has seen Dr. Gavin Pound of rheumatology. She was started on CellCept in March 2015. According to her and her son the higher doses causes side effects. Therefore she  is back down 500 milligrams twice a day. She says she is tolerating this well. She is attending pulmonary rehabilitation. Overall she's feeling much better than her previous baseline with reduced dyspnea, increased quality of life and increase physical conditioning. Apparently pulmonary rehabilitation helps her a lot. She was supposed to have pulmonary function test today but she did not fall unclear reasons perhaps due to scheduling. She continues to use oxygen 3 L at night at rest. At daytime she uses 6 L. And even with a 6 L when she overexerts herself evidence of the house she is transient desaturation to the 70s but this is baseline  SPirometry in office today shows FVC of 1.34/71%. FEV1 was 1.06 L or 76%. Ratio 79/104%. Stable since May 2014   OV 12/22/2013  Chief Complaint  Patient presents with  . Follow-up    Pt with PFT results. Pt states her breathing is unchanged since last OV. Pt c/o DOE. Pt denies cough and Cp/tightness.    Followup interstitial lung disease pulmonary  fibrosis UIP pattern secondary to rheumatoid arthritis  She is here with her son Gene. She reports overall stability in the sense of well-being. She takes CellCept 1071m in day, 5024mat night (son verified this the following day). She did not bring a medication list with her today. She continues to attend pulmonary rehabilitation. She feels that this helps the with her dyspnea, quality of life and physical conditioning. Son notices that now at rest that she even manages well with 1 L oxygen and they think that she can get by with 2 L of oxygen at rest at night. In the daytime and apparently at rehabilitation with 6 L she does not desaturate. Pulmonary function test that shows continued stability of her lung function since May of 2014 and especially since starting CellCept in the spring of this year 2015. At this point in time she does not have any problems tolerating the CellCept   Pulmonary function test August 27 today  shows FVC 1.3. The/69%. FEV1 1.2 L/87%. Ratio of 92/125%. DLCO of 6.4/36%. The numbers are stable since May 2014.     Acute OV ILD /RA on cellcept/steroids  Presents with son for an acute office visit.  Complains of 2 weeks of increased DOE. O2 sats running low at pulm rehab.  Complains of  nasal congestion, increased SOB, wheezing and chest tightnes, and productive cough with white mucus. Denies f/c/s, n/v, hemoptysis, edema .  More winded than her usual. Wears out easily No chest pain,hemoptysis  orthopnea, edema or fever.  No otc used.  Remains on pred 3m2maily   12/06/2014 Acute OV  Presents for an acute office visit.  Complains of increased cough, congestion , with thick mucus , and more dypsnea/DOE.  Worse over last couple of months.  Denies any chest congestion/tightness, wheezing.  On oxygen 3-4 L O2 at rest and 8 lm with act.    Remains on Symbicort Twice daily  .  Remains on pred 3mg33mily  On cellcept. Twice daily  .  Follows with Rheumatology for RA with routine labs.  PVX is utd .  Discussed getting prevnar once she is well again.     Review of Systems  Constitutional: Negative for fever and unexpected weight change.  HENT: Negative for congestion, dental problem, ear pain, nosebleeds, postnasal drip, rhinorrhea, sinus pressure, sneezing, sore throat and trouble swallowing.   Eyes: Negative for redness and itching.  Respiratory: Positive for cough and shortness of breath. Negative for chest tightness and wheezing.   Cardiovascular: Negative for palpitations and leg swelling.  Gastrointestinal: Negative for nausea and vomiting.  Genitourinary: Negative for dysuria.  Musculoskeletal: Negative for joint swelling.  Skin: Negative for rash.  Neurological: Negative for headaches.  Hematological: Does not bruise/bleed easily.  Psychiatric/Behavioral: Negative for dysphoric mood. The patient is not nervous/anxious.       Objective:   Physical Exam Vitals  reviewed. GEN: A/Ox3; pleasant , NAD, chronically ill appearing   HEENT:  [place]/AT,  EACs-clear, TMs-wnl, NOSE-clear, THROAT-clear, no lesions, no postnasal drip or exudate noted.   NECK:  Supple w/ fair ROM; no JVD; normal carotid impulses w/o bruits; no thyromegaly or nodules palpated; no lymphadenopathy.  RESP  BB crackles noted. no accessory muscle use, no dullness to percussion  CARD:  RRR, no m/r/g  , no peripheral edema, pulses intact, no cyanosis or clubbing.  GI:   Soft & nt; nml bowel sounds; no organomegaly or masses detected.  Musco: Warm bil, no deformities or joint swelling  noted.   Neuro: alert, no focal deficits noted.    Skin: Warm, no lesions or rashes        Assessment & Plan:

## 2014-12-06 NOTE — Patient Instructions (Addendum)
Zpack take as directed.  Increase Prednisone 75m daily for 1 week then back to 825mdaily  Mucinex DM Twice daily  As needed  Cough/congestion  Please contact office for sooner follow up if symptoms do not improve or worsen or seek emergency care  Follow up Dr. RaChase Callern 3 months and As needed

## 2014-12-06 NOTE — Therapy (Signed)
[facility] [address], [place], [place], [address] Phone: [phone]   Fax:  [phone]  Physical Therapy Treatment  Patient Details  Name: Donna Watts MRN: [id number] Date of Birth: January 20, 1930 Referring Provider:  Delrae Rend, MD  Encounter Date: 12/06/2014      PT End of Session - 12/06/14 1005    Visit Number 18   Number of Visits 20  Medicare   Date for PT Re-Evaluation 12/22/14   PT Start Time 0930   PT Stop Time 1016   PT Time Calculation (min) 46 min   Equipment Utilized During Treatment Oxygen   Activity Tolerance Patient tolerated treatment well   Behavior During Therapy [facility] for tasks assessed/performed      Past Medical History  Diagnosis Date  . Other and unspecified hyperlipidemia   . Esophageal reflux   . Pulmonary fibrosis   . Diabetes mellitus without complication   . Acid reflux   . Rheumatoid arthritis   . Cancer     Past Surgical History  Procedure Laterality Date  . Appendectomy      There were no vitals filed for this visit.  Visit Diagnosis:  Osteoporosis  Decreased mobility and endurance      Subjective Assessment - 12/06/14 0937    Subjective I feel good today.   Currently in Pain? No/denies                         [facility] Adult PT Treatment/Exercise - 12/06/14 0001    Knee/Hip Exercises: Aerobic   Nustep Level 2, seat 5, arms 3, 10 minutes   Knee/Hip Exercises: Standing   Hip Extension Stengthening;2 sets;10 reps  also hip abduction 2x10, added 3# to ankles   Extension Limitations standing on blue thraband pods for increased challenge with abduction and extension   Knee/Hip Exercises: Seated   Long Arc Quad Strengthening;Both;3 sets;10 reps   Long Arc Quad Weight 3 lbs.   Marching Limitations seated 2x10 each   Marching Weights 3 lbs.   Abd/Adduction Limitations Adductor squeeze 2x20   Shoulder Exercises: Seated   Horizontal ABduction  Strengthening;Both;20 reps;Theraband   Theraband Level (Shoulder Horizontal ABduction) Level 2 (Red)   Flexion Both;20 reps   Flexion Weight (lbs) 2                  PT Short Term Goals - 11/13/14 1251    PT SHORT TERM GOAL #1   Title Initiate HEP focusing on managing osteoporosis postural deficits and strength deficits    Time 4   Period Weeks   Status Achieved   PT SHORT TERM GOAL #2   Title Will verbally understand correct body mechanics for home and work tasks to decrease strain on spine.    Time 4   Period Weeks   Status Achieved   PT SHORT TERM GOAL #3   Title Will decrease time for 5x sit to stand to < or = 23 seconds without use of UE    Time 4   Period Weeks   Status Achieved           PT Long Term Goals - 12/04/14 1244    PT LONG TERM GOAL #1   Title Will verbally understand do's and don'ts of osteoporosis management    Status Achieved   PT LONG TERM GOAL #2   Title Will verbally understand ways to manage osteoporosis with diet and reduce strain postures on the  spine    Status Achieved   PT LONG TERM GOAL #3   Title Will verbally understand how to strengthen postural musculature    Time 8   Period Weeks   Status On-going  continue to build HEP   PT LONG TERM GOAL #5   Title improve strength to report 50% improvement in endurance with standing and walking tasks   Time 4   Period Weeks   Status On-going  depends on weather right now               Plan - 12/06/14 0109    Clinical Impression Statement Pt tolerated treatment well with reduced fatigue today.  Pt tolerated addition of 3# ankle weights with standing hip exercises today. Pt has not been able to walk outside due to heat and humidity outside.  Pt monitored O2 during exercise today.  Pt with continued UE/LE strength and endurance deficits that limit her endurance for home tasks.  Pt will benfit from PT for endurance, strength and balance improvements.   Pt will benefit from skilled  therapeutic intervention in order to improve on the following deficits Abnormal gait;Difficulty walking;Decreased endurance;Cardiopulmonary status limiting activity;Decreased activity tolerance;Decreased balance;Decreased strength   Rehab Potential Good   PT Frequency 2x / week   PT Duration 4 weeks   PT Treatment/Interventions ADLs/Self Care Home Management;Energy conservation;Therapeutic exercise;Balance training;Moist Heat;Therapeutic activities;Neuromuscular re-education;Functional mobility training;Stair training;Gait training;Passive range of motion;Patient/family education;Orthotic Fit/Training;Manual techniques   PT Next Visit Plan UE/LE strength, balance and endurance training.   Consulted and Agree with Plan of Care Patient        Problem List Patient Active Problem List   Diagnosis Date Noted  . Dry eye 08/29/2013  . Chemosis 08/29/2013  . 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 Meibomian gland disease 07/07/2013  . Deformity of eyelid 07/07/2013  . Arthritis or polyarthritis, rheumatoid 07/07/2013  . Scar of eyelid 07/07/2013  . Diabetes 09/23/2012  . DYSPNEA 09/23/2012  . Hypoxemia 09/23/2012  . Awaiting organ transplant 09/23/2012  . Chronic respiratory failure 07/07/2011  . GERD 09/06/2009  . PULMONARY FIBROSIS 10/12/2008  . Cough 10/12/2008    Nicholaos Schippers, PT 12/06/2014, 10:06 AM  [facility] [address] [place], [place], [address] Phone: [phone]   Fax:  [phone]

## 2014-12-07 DIAGNOSIS — J209 Acute bronchitis, unspecified: Secondary | ICD-10-CM | POA: Insufficient documentation

## 2014-12-07 NOTE — Assessment & Plan Note (Signed)
?:  Mild flare although no increased O2 demand or activity decline   Plan  Short pred burst   Increase Prednisone 42m daily for 1 week then back to 837mdaily  Mucinex DM Twice daily  As needed  Cough/congestion  Please contact office for sooner follow up if symptoms do not improve or worsen or seek emergency care  Follow up Dr. RaChase Callern 3 months and As needed

## 2014-12-07 NOTE — Assessment & Plan Note (Signed)
Flare   Plan  Zpack take as directed.  Increase Prednisone 2m daily for 1 week then back to 854mdaily  Mucinex DM Twice daily  As needed  Cough/congestion  Please contact office for sooner follow up if symptoms do not improve or worsen or seek emergency care  Follow up Dr. RaChase Callern 3 months and As needed

## 2014-12-07 NOTE — Assessment & Plan Note (Signed)
Compensated on O2

## 2014-12-11 ENCOUNTER — Encounter: Payer: Self-pay | Admitting: Physical Therapy

## 2014-12-11 ENCOUNTER — Ambulatory Visit: Payer: Medicare Other | Admitting: Physical Therapy

## 2014-12-11 DIAGNOSIS — Z7409 Other reduced mobility: Secondary | ICD-10-CM

## 2014-12-11 DIAGNOSIS — M810 Age-related osteoporosis without current pathological fracture: Secondary | ICD-10-CM

## 2014-12-11 DIAGNOSIS — R269 Unspecified abnormalities of gait and mobility: Secondary | ICD-10-CM

## 2014-12-11 NOTE — Therapy (Addendum)
[facility] [address], [place], [place], [address] Phone: [phone]   Fax:  [phone]  Physical Therapy Treatment  Patient Details  Name: Donna Watts MRN: [id number] Date of Birth: 1929/11/01 Referring Provider:  Delrae Rend, MD  Encounter Date: 12/11/2014      PT End of Session - 12/11/14 1234    Visit Number 19   Number of Visits 20  medicare   Date for PT Re-Evaluation 12/22/14   PT Start Time 1230   PT Stop Time 1310   PT Time Calculation (min) 40 min   Equipment Utilized During Treatment Oxygen   Activity Tolerance Patient tolerated treatment well   Behavior During Therapy [facility] for tasks assessed/performed      Past Medical History  Diagnosis Date  . Other and unspecified hyperlipidemia   . Esophageal reflux   . Pulmonary fibrosis   . Diabetes mellitus without complication   . Acid reflux   . Rheumatoid arthritis   . Cancer     Past Surgical History  Procedure Laterality Date  . Appendectomy      There were no vitals filed for this visit.  Visit Diagnosis:  Osteoporosis  Decreased mobility and endurance  Abnormality of gait      Subjective Assessment - 12/11/14 1234    Subjective I feel good.I feel stronger.     Pertinent History Fall 1 month ago    How long can you walk comfortably? About 15 minutes due to decreased endurance    Patient Stated Goals Get stronger in her legs    Currently in Pain? No/denies            [facility] PT Assessment - 12/11/14 0001    Standardized Balance Assessment   Standardized Balance Assessment Five Times Sit to Stand   Five times sit to stand comments  19 sec,   2nd 16 sec. no hand support                     [facility] Adult PT Treatment/Exercise - 12/11/14 0001    Knee/Hip Exercises: Aerobic   Nustep Level 2, seat 5, arms 3, 7.5 minutes   Knee/Hip Exercises: Standing   Hip Extension Stengthening;2 sets;10 reps  also hip  abduction 2x10, added 3# to ankles   Extension Limitations standing on blue thraband pods for increased challenge with abduction and extension   Knee/Hip Exercises: Seated   Long Arc Quad Strengthening;Both;3 sets;10 reps   Long Arc Quad Weight 3 lbs.   Marching Limitations seated 2x10 each   Marching Weights 3 lbs.   Abd/Adduction Limitations Adductor squeeze 2x20   Shoulder Exercises: Seated   Horizontal ABduction Strengthening;Both;20 reps;Theraband   Theraband Level (Shoulder Horizontal ABduction) Level 2 (Red)                PT Education - 12/11/14 1256    Education provided No          PT Short Term Goals - 12/11/14 1236    PT SHORT TERM GOAL #1   Title Initiate HEP focusing on managing osteoporosis postural deficits and strength deficits    Time 4   Period Weeks   Status Achieved   PT SHORT TERM GOAL #2   Title Will verbally understand correct body mechanics for home and work tasks to decrease strain on spine.    Time 4   Period Weeks   Status Achieved   PT SHORT TERM GOAL #3   Title  Will decrease time for 5x sit to stand to < or = 23 seconds without use of UE    Time 4   Period Weeks   Status Achieved           PT Long Term Goals - 2014-12-25 1236    PT LONG TERM GOAL #1   Title Will verbally understand do's and don'ts of osteoporosis management    Time 8   Period Weeks   Status Achieved   PT LONG TERM GOAL #2   Title Will verbally understand ways to manage osteoporosis with diet and reduce strain postures on the spine    Time 8   Period Weeks   Status Achieved   PT LONG TERM GOAL #3   Title Will verbally understand how to strengthen postural musculature    Time 8   Period Weeks   Status On-going  continue to bulid HEP   PT LONG TERM GOAL #4   Title Improve 5x sit to stand score to = or  < 15 seconds to decrease falls risk    Time 4   Period Weeks   Status On-going  16 sec.   PT LONG TERM GOAL #5   Title improve strength to report 50%  improvement in endurance with standing and walking tasks   Time 4   Period Weeks   Status Achieved               Plan - 12/25/14 1257    Clinical Impression Statement Patient is a 81 year old female with diagnosis of osteoporosis and frequent falls.  Patient reports her endurance is 50% better.  Patient is able to go from sit to stand 5 times in 16 seconds.  Patient is now able to use 3 pound weights on her ankles.  Patient monitors her oxygen levels as she is exercising.  Patient benefits from physical therapy to improve endurance and LE/UE strength.    Pt will benefit from skilled therapeutic intervention in order to improve on the following deficits Abnormal gait;Difficulty walking;Decreased endurance;Cardiopulmonary status limiting activity;Decreased activity tolerance;Decreased balance;Decreased strength   Rehab Potential Good   Clinical Impairments Affecting Rehab Potential None   PT Frequency 2x / week   PT Duration 4 weeks   PT Treatment/Interventions ADLs/Self Care Home Management;Energy conservation;Therapeutic exercise;Balance training;Moist Heat;Therapeutic activities;Neuromuscular re-education;Functional mobility training;Stair training;Gait training;Passive range of motion;Patient/family education;Orthotic Fit/Training;Manual techniques   PT Next Visit Plan UE/LE strength, balance and endurance training.; G-code and progress note   PT Home Exercise Plan progress as needed   Consulted and Agree with Plan of Care Patient        Problem List Patient Active Problem List   Diagnosis Date Noted  . Acute bronchitis 12/07/2014  . Dry eye 08/29/2013  . Chemosis 08/29/2013  . 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 Meibomian gland disease 07/07/2013  . Deformity of eyelid 07/07/2013  . Arthritis or polyarthritis, rheumatoid 07/07/2013  . Scar of eyelid 07/07/2013  . Diabetes 09/23/2012  . DYSPNEA 09/23/2012  . Hypoxemia 09/23/2012  . Awaiting organ transplant  09/23/2012  . Chronic respiratory failure 07/07/2011  . GERD 09/06/2009  . PULMONARY FIBROSIS 10/12/2008  . Cough 10/12/2008    GRAY,CHERYL,PT Dec 25, 2014, 1:06 PM G-codes:  Goal status CK                   D/C status CK: Other PT category PHYSICAL THERAPY DISCHARGE SUMMARY  Visits from Start of Care: 19  Current functional level related to  goals / functional outcomes: See above for current status.  Pt has well established HEP and walking program.  5x sit to stand was 16 seconds.     Remaining deficits: Pt with continued balance and endurance deficits of a chronic nature due to O2 dependency and osteoporosis.  Pt has HEP in place to improve these deficits.  Thank you for this referral.     Education / Equipment: HEP Plan: Patient agrees to discharge.  Patient goals were met. Patient is being discharged due to meeting the stated rehab goals.  ?????   Sigurd Sos, [place] 12/19/2014 7:29 AM  [facility] [address], [place], [place], [address] Phone: [phone]   Fax:  [phone]

## 2014-12-13 ENCOUNTER — Ambulatory Visit: Payer: Medicare Other

## 2014-12-20 ENCOUNTER — Ambulatory Visit: Payer: Self-pay

## 2015-04-13 ENCOUNTER — Ambulatory Visit (INDEPENDENT_AMBULATORY_CARE_PROVIDER_SITE_OTHER): Payer: Medicare Other | Admitting: Internal Medicine

## 2015-04-13 ENCOUNTER — Encounter: Payer: Self-pay | Admitting: Internal Medicine

## 2015-04-13 VITALS — BP 144/80 | HR 77 | Ht 59.0 in | Wt 110.0 lb

## 2015-04-13 DIAGNOSIS — Z23 Encounter for immunization: Secondary | ICD-10-CM

## 2015-04-13 DIAGNOSIS — J8410 Pulmonary fibrosis, unspecified: Secondary | ICD-10-CM

## 2015-04-13 DIAGNOSIS — J9611 Chronic respiratory failure with hypoxia: Secondary | ICD-10-CM

## 2015-04-13 MED ORDER — FLUTICASONE PROPIONATE 50 MCG/ACT NA SUSP: 2.0000 | Freq: Every day | NASAL | Status: DC

## 2015-04-13 NOTE — Progress Notes (Signed)
Subjective:     Patient ID: Donna Watts, female   DOB: October 22, 1929, 81 y.o.   MRN: [id number]  HPI   HPI  PCP FULP, CAMMIE, MD Rheum: Dr Gavin Pound  HPI  #RA-ILD/UIP pattern  -  h/o cough onset around 2005 with a cxr showing scarring per Dr Inda Merlin office records c/w PF -  - Diagnosed RA with arthritis and ? EN summer 2014  - First CT chest June 2010: R> L UIP pattern  -Followup CT chest June 2014 - R > L UIP pattern much worse   - - PFT's November 09, 2008 VC 2.03 ( 92%) DLC0 52%  - PFT's October 25, 2009 VC 2.0 (92%)   DLC0 58%  -PFT's  09/25/10            VC1.88 (86%)  DLCO 54 - Ex desat documented 11/09/08 x 1 lap > 84% p 2 laps October 25, 2009 > 84% p one lap 08/27/2010     > 87 p 2 laps RA 09/25/2010 > 83% p 2 laps at a very rapid pace 12/02/10 rec pace or wear 02, wants to try pacing - 07/04/2011   Walked RA x one lap @ 185 stopped due to  sats 87% at nl pace > 2lpm - 08/29/2011   Walked RA x one lap @ 185 stopped due to  desat to 88, improved but not eliminated on 2lpm so rec use 3lpm with ex - - PFT May 2014: FVC 1.3L/67%, DLCO 6.4/36%   - > started on CellCept in March 2015. [name] May 2015: FVC of 1.34/71% - PFT August 2015: VC 1.3. The/69%. FEV1 1.2 L/87%. Ratio of 92/125%. DLCO of 6.4/36%.    - Rx   - PPI since 2010  - Immuran/Pred since 2014   - O2 dependent x ? 2012     - Jan 2015: 3L at rest, 5L with exertion, 8L uphill   - started on CellCept in March 2015.    .................................................. OV 09/12/2013  Chief Complaint  Patient presents with  . Follow-up    Pt states her breathing has improved. C/o DOE, intermittent productive cough with white mucous. Denies CP.    Followup interstitial lung disease UIP pattern secondary to rheumatoid arthritis  Since last visit and her 2015 she has seen Dr. Gavin Pound of rheumatology. She was started on CellCept in March 2015. According to her and her son the higher doses causes side effects. Therefore she  is back down 500 milligrams twice a day. She says she is tolerating this well. She is attending pulmonary rehabilitation. Overall she's feeling much better than her previous baseline with reduced dyspnea, increased quality of life and increase physical conditioning. Apparently pulmonary rehabilitation helps her a lot. She was supposed to have pulmonary function test today but she did not fall unclear reasons perhaps due to scheduling. She continues to use oxygen 3 L at night at rest. At daytime she uses 6 L. And even with a 6 L when she overexerts herself evidence of the house she is transient desaturation to the 70s but this is baseline  SPirometry in office today shows FVC of 1.34/71%. FEV1 was 1.06 L or 76%. Ratio 79/104%. Stable since May 2014   OV 12/22/2013  Chief Complaint  Patient presents with  . Follow-up    Pt with PFT results. Pt states her breathing is unchanged since last OV. Pt c/o DOE. Pt denies cough and Cp/tightness.    Followup interstitial lung disease pulmonary  fibrosis UIP pattern secondary to rheumatoid arthritis  She is here with her son Gene. She reports overall stability in the sense of well-being. She takes CellCept 104m in day, 5048mat night (son verified this the following day). She did not bring a medication list with her today. She continues to attend pulmonary rehabilitation. She feels that this helps the with her dyspnea, quality of life and physical conditioning. Son notices that now at rest that she even manages well with 1 L oxygen and they think that she can get by with 2 L of oxygen at rest at night. In the daytime and apparently at rehabilitation with 6 L she does not desaturate. Pulmonary function test that shows continued stability of her lung function since May of 2014 and especially since starting CellCept in the spring of this year 2015. At this point in time she does not have any problems tolerating the CellCept   Pulmonary function test August 27 today  shows FVC 1.3. The/69%. FEV1 1.2 L/87%. Ratio of 92/125%. DLCO of 6.4/36%. The numbers are stable since May 2014.     Acute OV ILD /RA on cellcept/steroids  Presents with son for an acute office visit.  Complains of 2 weeks of increased DOE. O2 sats running low at pulm rehab.  Complains of  nasal congestion, increased SOB, wheezing and chest tightnes, and productive cough with white mucus. Denies f/c/s, n/v, hemoptysis, edema .  More winded than her usual. Wears out easily No chest pain,hemoptysis  orthopnea, edema or fever.  No otc used.  Remains on pred 48m76maily   12/06/2014 Acute OV  Presents for an acute office visit.  Complains of increased cough, congestion , with thick mucus , and more dypsnea/DOE.  Worse over last couple of months.  Denies any chest congestion/tightness, wheezing.  On oxygen 3-4 L O2 at rest and 8 lm with act.    Remains on Symbicort Twice daily  .  Remains on pred 48mg33mily  On cellcept. Twice daily  .  Follows with Rheumatology for RA with routine labs.  PVX is utd .  Discussed getting prevnar once she is well again.    OV 04/13/2015  Chief Complaint  Patient presents with  . Follow-up    Pt states her breathing is unchanged since last Ov. Pt states she has an increase in cough at night. Pt denies CP/tightness and f/c/s.    Follow-up pulmonary fibrosis secondary to rheumatoid arthritis. Maintain on oxygen, CellCept and prednisone. She gets a CellCept and prednisone through Dr. AngeGavin Pounde now follows with Dr. AngeGavin [facility]oundthe new practice [facility]. Overall she's doing well. Dyspnea stable. Son reports she has cough that is worse at night this is also stable. Its of moderate intensity. It is associated with chronic ear blockage symptoms. Apparently they're considering a ENT evaluation. She uses  To 4 L at rest and more at night. Son sometimes increases the oxygen when she is sleeping because of the cough but is not sure  if this is helping. She does not use nasal steroids as she is supposed to. She used it in pulmonary rehabilitation a few years ago. Son feels she is more deconditioned now and is wondering if going back that would benefit her  Immunization History  Administered Date(s) Administered  . Influenza Split 01/26/2013, 01/26/2014  . Influenza,inj,Quad PF,36+ Mos 02/05/2015  . Pneumococcal Polysaccharide-23 04/26/2011     Current outpatient prescriptions:  .  albuterol (VENTOLIN HFA) 108 (90  BASE) MCG/ACT inhaler, Inhale 2 puffs into the lungs every 6 (six) hours as needed., Disp: 1 Inhaler, Rfl: 6 .  aspirin 81 MG tablet, Take 81 mg by mouth daily.  , Disp: , Rfl:  .  budesonide-formoterol (SYMBICORT) 160-4.5 MCG/ACT inhaler, Take 2 puffs first thing in am and then another 2 puffs about 12 hours later., Disp: 1 Inhaler, Rfl: 6 .  Cholecalciferol (VITAMIN D) 2000 UNITS CAPS, Take 1 capsule by mouth daily., Disp: , Rfl:  .  dextromethorphan-guaiFENesin (MUCINEX DM) 30-600 MG per 12 hr tablet, Take 1 tablet by mouth every 12 (twelve) hours as needed. , Disp: , Rfl:  .  fluticasone (FLONASE) 50 MCG/ACT nasal spray, Place 2 sprays into both nostrils daily. (Patient taking differently: Place 2 sprays into both nostrils daily as needed. ), Disp: 16 g, Rfl: 5 .  metFORMIN (GLUCOPHAGE) 500 MG tablet, Take 500 mg by mouth 2 (two) times daily with a meal., Disp: , Rfl:  .  Multiple Vitamins-Minerals (CENTRUM SILVER PO), Take 1 tablet by mouth daily.  , Disp: , Rfl:  .  mycophenolate (CELLCEPT) 500 MG tablet, Take 500 mg by mouth 2 (two) times daily. 2 tablets in morning, 1 tablet in evening, Disp: , Rfl:  .  omeprazole (PRILOSEC) 20 MG capsule, Take 20 mg by mouth 2 (two) times daily as needed. , Disp: , Rfl:  .  predniSONE (DELTASONE) 1 MG tablet, Take 3 tablets by mouth at bedtime., Disp: , Rfl:  .  predniSONE (DELTASONE) 5 MG tablet, Take 5 mg by mouth. Take 1 tablet by mouth every morning., Disp: , Rfl:  .   simvastatin (ZOCOR) 20 MG tablet, Take 20 mg by mouth at bedtime.  , Disp: , Rfl:   Allergies  Allergen Reactions  . Tramadol Rash, Other (See Comments) and Hypertension    Dizziness      Review of Systems     Objective:   Physical Exam  Constitutional: She is oriented to person, place, and time. She appears well-developed and well-nourished. No distress.  Frail elderly female sitting comfortably  HENT:  Head: Normocephalic and atraumatic.  Right Ear: External ear normal.  Left Ear: External ear normal.  Mouth/Throat: Oropharynx is clear and moist. No oropharyngeal exudate.  Oxygen on  Eyes: Conjunctivae and EOM are normal. Pupils are equal, round, and reactive to light. Right eye exhibits no discharge. Left eye exhibits no discharge. No scleral icterus.  Neck: Normal range of motion. Neck supple. No JVD present. No tracheal deviation present. No thyromegaly present.  Cardiovascular: Normal rate, regular rhythm, normal heart sounds and intact distal pulses.  Exam reveals no gallop and no friction rub.   No murmur heard. Pulmonary/Chest: Effort normal. No respiratory distress. She has no wheezes. She has rales. She exhibits no tenderness.  Bilateral crackles present  Abdominal: Soft. Bowel sounds are normal. She exhibits no distension and no mass. There is no tenderness. There is no rebound and no guarding.  Musculoskeletal: Normal range of motion. She exhibits no edema or tenderness.  Lymphadenopathy:    She has no cervical adenopathy.  Neurological: She is alert and oriented to person, place, and time. She has normal reflexes. No cranial nerve deficit. She exhibits normal muscle tone. Coordination normal.  Skin: Skin is warm and dry. No rash noted. She is not diaphoretic. No erythema. No pallor.  Psychiatric: She has a normal mood and affect. Her behavior is normal. Judgment and thought content normal.  Vitals reviewed.   Filed Vitals:  04/13/15 1523  BP: 144/80  Pulse: 77   Height: _0  (1.499 m)  Weight: 110 lb (49.896 kg)  SpO2: 98%         Assessment:       ICD-9-CM ICD-10-CM   1. PULMONARY FIBROSIS 515 J84.10   2. Chronic respiratory failure with hypoxia (HCC) 518.83 J96.11    799.02         Plan:      Clinically stable Continue to use 4 L of oxygen at rest and higher if needed for exertion START generic fluticasone inhaler 2 squirts each nostril daily for nocturnal cough and ear blockage Cellcept and Prednisone per Dr Trudie Reed  PLAN - full PFT in 3 months -= re-refer pulmonary rehab - offer PREVNAR  Followup - 3 months after full PFT   Dr. Brand Males, M.D., [facility].C.P Pulmonary and Critical Care Medicine Staff Physician [facility] Pager: [phone], If no answer or between  15:00h - 7:00h: call [phone]  04/13/2015 3:45 PM

## 2015-04-13 NOTE — Patient Instructions (Addendum)
ICD-9-CM ICD-10-CM   1. PULMONARY FIBROSIS 515 J84.10   2. Chronic respiratory failure with hypoxia (HCC) 518.83 J96.11    799.02     Clinically stable Continue to use 4 L of oxygen at rest and higher if needed for exertion START generic fluticasone inhaler 2 squirts each nostril daily for nocturnal cough and ear blockage Cellcept and Prednisone per Dr Trudie Reed  PLAN - full PFT in 3 months -= re-refer pulmonary rehab - offer prevnar   Followup - 3 months after full PFT

## 2015-04-13 NOTE — Addendum Note (Signed)
Addended by: Maurice March on: 04/13/2015 05:38 PM   Modules accepted: Orders

## 2015-04-24 ENCOUNTER — Telehealth (HOSPITAL_COMMUNITY): Payer: Self-pay

## 2015-04-24 NOTE — Telephone Encounter (Signed)
Spoke with son in regards to Pulmonary Rehab referral from Dr. Chase Caller. Son stated that he wants to get mother into program but needs to discuss with wife to coordinate transportation. Son will follow up with [place].

## 2015-05-01 ENCOUNTER — Telehealth (HOSPITAL_COMMUNITY): Payer: Self-pay

## 2015-05-01 NOTE — Telephone Encounter (Signed)
Returned call from patient's son regarding Pulmonary Rehab referral. Left message to call back.

## 2015-05-21 ENCOUNTER — Ambulatory Visit (HOSPITAL_COMMUNITY): Payer: Medicare Other

## 2015-05-31 ENCOUNTER — Encounter: Payer: Self-pay | Admitting: Diagnostic Neuroimaging

## 2015-05-31 ENCOUNTER — Ambulatory Visit (INDEPENDENT_AMBULATORY_CARE_PROVIDER_SITE_OTHER): Payer: Medicare Other | Admitting: Diagnostic Neuroimaging

## 2015-05-31 VITALS — BP 134/72 | HR 86 | Ht 59.0 in | Wt 106.4 lb

## 2015-05-31 DIAGNOSIS — R208 Other disturbances of skin sensation: Secondary | ICD-10-CM | POA: Diagnosis not present

## 2015-05-31 DIAGNOSIS — R200 Anesthesia of skin: Secondary | ICD-10-CM

## 2015-05-31 NOTE — Patient Instructions (Signed)

## 2015-05-31 NOTE — Progress Notes (Signed)
[facility]  PATIENT: Donna Watts DOB: [birth year]  REFERRING CLINICIAN: Hawkes HISTORY FROM: patient and son REASON FOR VISIT: new consult    HISTORICAL  CHIEF COMPLAINT:  Chief Complaint  Patient presents with  . Numbness    rm 6, New Patient, "numbness L leg"    HISTORY OF PRESENT ILLNESS:   82 year old female with pulmonary fibrosis, diabetes, hypercholesterolemia, here for evaluation of left leg numbness.  July 2016 patient tripped and fell, twisting her left leg. Ever since that time she has had abnormal sensation in her left foot and leg. She describes a numbness sensation, sometimes colds, sometimes painful sensation. She has some milder abnormal sensation in her right leg.  Patient also has neck and low back pain. She denies any pain radiating from her low back down to her legs.  Patient still able to ambulate and do daily activities. Her symptoms seem to be aggravated more when she is sitting down for a long time.    REVIEW OF SYSTEMS: Full 14 system review of systems performed and notable only for shortness of breath cough hearing loss runny nose memory loss numbness.  ALLERGIES: Allergies  Allergen Reactions  . Tramadol Rash, Other (See Comments) and Hypertension    Dizziness     HOME MEDICATIONS: Outpatient Prescriptions Prior to Visit  Medication Sig Dispense Refill  . albuterol (VENTOLIN HFA) 108 (90 BASE) MCG/ACT inhaler Inhale 2 puffs into the lungs every 6 (six) hours as needed. 1 Inhaler 6  . aspirin 81 MG tablet Take 81 mg by mouth daily.      . budesonide-formoterol (SYMBICORT) 160-4.5 MCG/ACT inhaler Take 2 puffs first thing in am and then another 2 puffs about 12 hours later. 1 Inhaler 6  . Cholecalciferol (VITAMIN D) 2000 UNITS CAPS Take 1 capsule by mouth daily.    Marland Kitchen dextromethorphan-guaiFENesin (MUCINEX DM) 30-600 MG per 12 hr tablet Take 1 tablet by mouth every 12 (twelve) hours as needed.     . fluticasone (FLONASE) 50  MCG/ACT nasal spray Place 2 sprays into both nostrils daily. (Patient taking differently: Place 2 sprays into both nostrils daily as needed. ) 16 g 5  . fluticasone (FLONASE) 50 MCG/ACT nasal spray Place 2 sprays into both nostrils daily. 16 g 5  . metFORMIN (GLUCOPHAGE) 500 MG tablet Take 500 mg by mouth 2 (two) times daily with a meal.    . Multiple Vitamins-Minerals (CENTRUM SILVER PO) Take 1 tablet by mouth daily.      . mycophenolate (CELLCEPT) 500 MG tablet Take 500 mg by mouth 2 (two) times daily. 2 tablets in morning, 1 tablet in evening    . omeprazole (PRILOSEC) 20 MG capsule Take 20 mg by mouth 2 (two) times daily as needed.     . predniSONE (DELTASONE) 1 MG tablet Take 3 tablets by mouth at bedtime.    . predniSONE (DELTASONE) 5 MG tablet Take 5 mg by mouth. Take 1 tablet by mouth every morning.    . simvastatin (ZOCOR) 20 MG tablet Take 20 mg by mouth at bedtime.       No facility-administered medications prior to visit.    PAST MEDICAL HISTORY: Past Medical History  Diagnosis Date  . Other and unspecified hyperlipidemia   . Esophageal reflux   . Pulmonary fibrosis ([place])   . Diabetes mellitus without complication ([place])   . Acid reflux   . Rheumatoid arthritis ([place])   . Cancer ([place])     precancerous skin    PAST  SURGICAL HISTORY: Past Surgical History  Procedure Laterality Date  . Appendectomy  1960's    FAMILY HISTORY: Family History  Problem Relation Age of Onset  . Heart disease Brother     SOCIAL HISTORY:  Social History   Social History  . Marital Status: Widowed    Spouse Name: N/A  . Number of Children: 3  . Years of Education: 12   Occupational History  . Retired     na   Social History Main Topics  . Smoking status: Never Smoker   . Smokeless tobacco: Never Used  . Alcohol Use: No  . Drug Use: No  . Sexual Activity: Not on file   Other Topics Concern  . Not on file   Social History Narrative   Lives with son   Caffeine use- coffee-  2 cups daily     PHYSICAL EXAM  GENERAL EXAM/CONSTITUTIONAL: Vitals:  Filed Vitals:   05/31/15 1526  BP: 134/72  Pulse: 86  Height: 4' 11" (1.499 m)  Weight: 106 lb 6.4 oz (48.263 kg)     Body mass index is 21.48 kg/(m^2).  No exam data present  Patient is in no distress; well developed, nourished and groomed; neck is supple  ON OXYGEN VIA [place]; SLIGHT LABORED BREATHING  CARDIOVASCULAR:  Examination of carotid arteries is normal; no carotid bruits  Regular rate and rhythm, no murmurs  Examination of peripheral vascular system by observation and palpation is normal  EYES:  Ophthalmoscopic exam of optic discs and posterior segments is normal; no papilledema or hemorrhages  MUSCULOSKELETAL:  Gait, strength, tone, movements noted in Neurologic exam below  NEUROLOGIC: MENTAL STATUS:  No flowsheet data found.  awake, alert, oriented to person, place and time  recent and remote memory intact  normal attention and concentration  language fluent, comprehension intact, naming intact,   fund of knowledge appropriate  CRANIAL NERVE:   2nd - no papilledema on fundoscopic exam  2nd, 3rd, 4th, 6th - pupils equal and reactive to light, visual fields full to confrontation, extraocular muscles intact, no nystagmus  5th - facial sensation symmetric  7th - facial strength symmetric  8th - hearing intact  9th - palate elevates symmetrically, uvula midline  11th - shoulder shrug symmetric  12th - tongue protrusion midline  MOTOR:   normal bulk and tone, strength 4+ SYMM in the BUE, BLE  SENSORY:   normal and symmetric to light touch; DECR PP IN LEFT WORSE THAN RIGHT LEG BELOW KNEE; temperature, vibration normal  COORDINATION:   finger-nose-finger, fine finger movements normal  REFLEXES:   deep tendon reflexes BUE 2, KNEES TRACE, ANKLES 0  GAIT/STATION:   narrow based gait    DIAGNOSTIC DATA (LABS, IMAGING, TESTING) - I reviewed patient records,  labs, notes, testing and imaging myself where available.  Lab Results  Component Value Date   WBC 9.8 03/21/2013   HGB 11.0* 03/21/2013   HCT 34.7* 03/21/2013   MCV 89.0 03/21/2013   PLT 265 03/21/2013      Component Value Date/Time   NA 138 03/21/2013 1218   K 5.0 03/21/2013 1218   CL 98 03/21/2013 1218   CO2 30 03/21/2013 1218   GLUCOSE 161* 03/21/2013 1218   BUN 18 03/21/2013 1218   CREATININE 0.58 03/21/2013 1218   CALCIUM 9.5 03/21/2013 1218   PROT 7.6 03/21/2013 1218   ALBUMIN 3.5 03/21/2013 1218   AST 18 03/21/2013 1218   ALT 10 03/21/2013 1218   ALKPHOS 71 03/21/2013 1218  BILITOT 0.3 03/21/2013 1218   GFRNONAA 84* 03/21/2013 1218   GFRAA >90 03/21/2013 1218   No results found for: CHOL, HDL, LDLCALC, LDLDIRECT, TRIG, CHOLHDL No results found for: HGBA1C No results found for: VITAMINB12 No results found for: TSH     ASSESSMENT AND PLAN  82 y.o. year old female here with left leg numbness following mechanical trip/fall. Patient continues to have symptoms since July 2016. Most likely represents mechanical stress, trauma, nerve injury. At this time symptoms are mild and intermittent. Advised conservative management for now.  Dx: neuropathy (secondary to mechanical stress/trauma; autoimmune dz, vasculitis, or other causes less likely)  PLAN: - monitor symptoms - in future could consider EMG/NCS and MRI lumbar spine  Return if symptoms worsen or fail to improve, for return to Dr. Trudie Reed.    Penni Bombard, MD 10/27/1585, 2:76 PM Certified in Neurology, Neurophysiology and Neuroimaging  [facility] [address], [place],  [address] [phone]

## 2015-06-05 ENCOUNTER — Other Ambulatory Visit: Payer: Self-pay | Admitting: Internal Medicine

## 2015-06-08 ENCOUNTER — Ambulatory Visit: Payer: Medicare Other | Admitting: Neurology

## 2015-07-20 ENCOUNTER — Ambulatory Visit (INDEPENDENT_AMBULATORY_CARE_PROVIDER_SITE_OTHER): Payer: Medicare Other | Admitting: Internal Medicine

## 2015-07-20 ENCOUNTER — Encounter: Payer: Self-pay | Admitting: Internal Medicine

## 2015-07-20 ENCOUNTER — Encounter: Payer: Medicare Other | Admitting: Internal Medicine

## 2015-07-20 DIAGNOSIS — J8410 Pulmonary fibrosis, unspecified: Secondary | ICD-10-CM

## 2015-07-20 LAB — PULMONARY FUNCTION TEST
DL/VA % PRED: 38 %
DL/VA: 1.56 ml/min/mmHg/L
DLCO COR % PRED: 23 %
DLCO COR: 4.07 ml/min/mmHg
DLCO UNC % PRED: 24 %
DLCO UNC: 4.31 ml/min/mmHg
FEF 25-75 Post: 0.93 L/sec
FEF 25-75 Pre: 1 L/sec
FEF2575-%Change-Post: -7 %
FEF2575-%Pred-Post: 100 %
FEF2575-%Pred-Pre: 107 %
FEV1-%CHANGE-POST: -1 %
FEV1-%Pred-Post: 80 %
FEV1-%Pred-Pre: 81 %
FEV1-Post: 1.09 L
FEV1-Pre: 1.1 L
FEV1FVC-%Change-Post: -5 %
FEV1FVC-%Pred-Pre: 110 %
FEV6-%Change-Post: 3 %
FEV6-%PRED-POST: 82 %
FEV6-%PRED-PRE: 79 %
FEV6-PRE: 1.37 L
FEV6-Post: 1.42 L
FEV6FVC-%CHANGE-POST: 0 %
FEV6FVC-%PRED-PRE: 107 %
FEV6FVC-%Pred-Post: 107 %
FVC-%Change-Post: 3 %
FVC-%PRED-POST: 76 %
FVC-%Pred-Pre: 73 %
FVC-Post: 1.42 L
FVC-Pre: 1.37 L
POST FEV1/FVC RATIO: 76 %
POST FEV6/FVC RATIO: 100 %
Pre FEV1/FVC ratio: 80 %
Pre FEV6/FVC Ratio: 100 %

## 2015-07-20 NOTE — Progress Notes (Signed)
PFT done today. 

## 2015-07-23 ENCOUNTER — Telehealth: Payer: Self-pay | Admitting: Internal Medicine

## 2015-07-23 NOTE — Telephone Encounter (Signed)
Spoke with pt's son [name] (on pt's DPR). He is fine with waiting until MR's next available. OV has been scheduled for 08/28/15 at 3:15pm. He would like a copy of the pt's PFT mailed to the pt's home. Address was verified. Nothing further was needed.

## 2015-07-23 NOTE — Telephone Encounter (Signed)
Called and spoke to pt. Informed her of the results and recs per MR. Pt verbalized understanding and states she will need to contact her son to facilitate transportation. Will keep message open till appt has been made.

## 2015-07-23 NOTE — Telephone Encounter (Signed)
Diffusion or oxygen transfer capacity worse than  1 year ago. Give first avail to discuss

## 2015-07-23 NOTE — Telephone Encounter (Signed)
[phone] Donna Watts calling back for his mom first opening is 05/02 he said it sounds like it should be sooner

## 2015-08-14 ENCOUNTER — Other Ambulatory Visit: Payer: Self-pay | Admitting: Internal Medicine

## 2015-08-28 ENCOUNTER — Encounter: Payer: Self-pay | Admitting: Internal Medicine

## 2015-08-28 ENCOUNTER — Ambulatory Visit (INDEPENDENT_AMBULATORY_CARE_PROVIDER_SITE_OTHER): Payer: Medicare Other | Admitting: Internal Medicine

## 2015-08-28 VITALS — BP 124/68 | HR 92 | Ht 59.0 in | Wt 101.0 lb

## 2015-08-28 DIAGNOSIS — R0600 Dyspnea, unspecified: Secondary | ICD-10-CM

## 2015-08-28 DIAGNOSIS — J8410 Pulmonary fibrosis, unspecified: Secondary | ICD-10-CM | POA: Diagnosis not present

## 2015-08-28 DIAGNOSIS — J9611 Chronic respiratory failure with hypoxia: Secondary | ICD-10-CM | POA: Diagnosis not present

## 2015-08-28 NOTE — Progress Notes (Signed)
Subjective:    Patient ID: Donna Watts, female    DOB: [birth year], 82 y.o.   MRN: [id number]  HPI  #RA-ILD/UIP pattern - h/o cough onset around 2005 with a cxr showing scarring per Dr Inda Merlin office records c/w PF - - Diagnosed RA with arthritis and ? EN summer 2014  - First CT chest June 2010: R> L UIP pattern -Followup CT chest June 2014 - R > L UIP pattern much worse   - - PFT's November 09, 2008 VC 2.03 ( 92%) DLC0 52%  - PFT's October 25, 2009 VC 2.0 (92%) DLC0 58%  -PFT's 09/25/10 VC1.88 (86%) DLCO 54 - Ex desat documented 11/09/08 x 1 lap > 84% p 2 laps October 25, 2009 > 84% p one lap 08/27/2010   > 87 p 2 laps RA 09/25/2010 > 83% p 2 laps at a very rapid pace 12/02/10 rec pace or wear 02, wants to try pacing - 07/04/2011 Walked RA x one lap @ 185 stopped due to sats 87% at nl pace > 2lpm - 08/29/2011 Walked RA x one lap @ 185 stopped due to desat to 88, improved but not eliminated on 2lpm so rec use 3lpm with ex - - PFT May 2014: FVC 1.3L/67%, DLCO 6.4/36% - > started on CellCept in March 2015. [name] May 2015: FVC of 1.34/71% - PFT August 2015: VC 1.3. The/69%. FEV1 1.2 L/87%. Ratio of 92/125%. DLCO of 6.4/36%. - Rx  - PPI since 2010 - Immuran/Pred since 2014  - O2 dependent x ? 2012 - Jan 2015: 3L at rest, 5L with exertion, 8L uphill  - started on CellCept in March 2015.    08/28/2015 Follow up office Visit:  Patient returns for a 3 month follow up.Son has notices some shortness of breath with exertion. She has not been doing the physical therapy, but is trying to exercise at home and walk with her son..She does monitor her oxygen sats at home. She is wearing her home oxygen at 7L while at home and 5L when sleeping. She does increase to 8 L with exertion.She knows goal is 90%.She does have some secretions at night, white in color.No fever or  chest pain, no orthopnea, hemoptysis.She is concerned about the fact that her diffusion capacity has decreased since her last PFT's, and had questions about what her options are as her oxygen demands increase.   Current outpatient prescriptions:  .  aspirin 81 MG tablet, Take 81 mg by mouth daily.  , Disp: , Rfl:  .  Cholecalciferol (VITAMIN D) 2000 UNITS CAPS, Take 1 capsule by mouth daily., Disp: , Rfl:  .  dextromethorphan-guaiFENesin (MUCINEX DM) 30-600 MG per 12 hr tablet, Take 1 tablet by mouth every 12 (twelve) hours as needed. , Disp: , Rfl:  .  fluticasone (FLONASE) 50 MCG/ACT nasal spray, Place 2 sprays into both nostrils daily. (Patient taking differently: Place 2 sprays into both nostrils daily as needed. ), Disp: 16 g, Rfl: 5 .  metFORMIN (GLUCOPHAGE) 500 MG tablet, Take 500 mg by mouth 2 (two) times daily with a meal., Disp: , Rfl:  .  Multiple Vitamins-Minerals (CENTRUM SILVER PO), Take 1 tablet by mouth daily.  , Disp: , Rfl:  .  mycophenolate (CELLCEPT) 500 MG tablet, Take 500 mg by mouth 2 (two) times daily. , Disp: , Rfl:  .  omeprazole (PRILOSEC) 20 MG capsule, Take 20 mg by mouth 2 (two) times daily as needed. , Disp: , Rfl:  .  predniSONE (  DELTASONE) 1 MG tablet, Take 3 tablets by mouth at bedtime., Disp: , Rfl:  .  predniSONE (DELTASONE) 5 MG tablet, Take 5 mg by mouth. Take 1 tablet by mouth every morning., Disp: , Rfl:  .  simvastatin (ZOCOR) 20 MG tablet, Take 20 mg by mouth at bedtime.  , Disp: , Rfl:  .  SYMBICORT 160-4.5 MCG/ACT inhaler, INHALE 2 PUFFS INTO THE LUNGS FIRST THING IN THE MORNING AND THEN ANOTHER 2 PUFFS ABOUT 12 HOURS LATER, Disp: 10.2 g, Rfl: 5 .  VENTOLIN HFA 108 (90 Base) MCG/ACT inhaler, INHALE TWO PUFFS INTO LUNGS EVERY 6 HOURS AS NEEDED, Disp: 18 each, Rfl: 5   Past Medical History  Diagnosis Date  . Other and unspecified hyperlipidemia   . Esophageal reflux   . Pulmonary fibrosis ([place])   . Diabetes mellitus without complication ([place])   . Acid  reflux   . Rheumatoid arthritis ([place])   . Cancer ([place])     precancerous skin    Allergies  Allergen Reactions  . Tramadol Rash, Other (See Comments) and Hypertension    Dizziness     Review of Systems Constitutional:   No  weight loss, night sweats,  Fevers, chills, fatigue, or  lassitude.  HEENT:   No headaches,  Difficulty swallowing,  Tooth/dental problems, or  Sore throat,                No sneezing, itching, ear ache, nasal congestion, post nasal drip,   CV:  No chest pain,  Orthopnea, PND, swelling in lower extremities, anasarca, dizziness, palpitations, syncope.   GI  No heartburn, indigestion, abdominal pain, nausea, vomiting, diarrhea, change in bowel habits, loss of appetite, bloody stools.   Resp: + shortness of breath with exertion or at rest.  No excess mucus, no productive cough,  No non-productive cough,  No coughing up of blood.  No change in color of mucus.  No wheezing.  No chest wall deformity  Skin: no rash or lesions.  GU: no dysuria, change in color of urine, no urgency or frequency.  No flank pain, no hematuria   MS:  No joint pain or swelling.  No decreased range of motion.  No back pain.  Psych:  No change in mood or affect. No depression or anxiety.  No memory loss.        Objective:   Physical Exam  BP 124/68 mmHg  Pulse 92  Ht _0  (1.499 m)  Wt 101 lb (45.813 kg)  BMI 20.39 kg/m2  SpO2 96%  Physical Exam:  General- No distress,  A&Ox3, very thin, frail female. ENT: No sinus tenderness, TM clear, pale nasal mucosa, no oral exudate,no post nasal drip, no LAN Cardiac: S1, S2, regular rate and rhythm, no murmur Chest: No wheeze/ + rales/ no dullness; no accessory muscle use, no nasal flaring, no sternal retractions Abd.: Soft Non-tender Ext: No clubbing cyanosis, edema Neuro:  normal strength Skin: No rashes, warm and dry Psych: normal mood and behavior Magdalen Spatz, AGACNP-BC [facility]      Assessment & Plan:  STAFF NOTE: I, Dr Ann Lions have personally reviewed patient's available data, including medical history, events of note, physical examination and test results as part of my evaluation. I have discussed with resident/NP and other care providers such as pharmacist, RN and RRT.  In addition,  I personally evaluated patient and elicited key findings of   S: some worsening in dyspnea with increased o2 need  O:  looks same. 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 Need to rule out PAH.   P: echo - eval for pulm pressure - if high - will rec RHC - some apprehensive about invasive procedure will address it based on echo Flexible fu Q6 months is what they want Continue o2   .  Rest per NP/medical resident whose note is outlined above and that I agree with  Dr. Brand Males, M.D., [facility].C.P Pulmonary and Critical Care Medicine Staff Physician [place] [facility] Pager: [phone], If no answer or between  15:00h - 7:00h: call [phone]  08/28/2015 4:24 PM

## 2015-08-28 NOTE — Patient Instructions (Addendum)
It is nice to meet you today. Continue wearing your oxygen with a goal of saturations greater than 90%. Continue taking your prednisone 5 mg in the morning and 3 mg at night. Continue taking your Symbicort daily Use flonase as needed for allergies. Continue CellCept We will have [facility] evaluate if you qualify for a more portble oxygen tank. Follow up with Dr. Chase Caller in 6 months. Please contact office for sooner follow up if symptoms do not improve or worsen or seek emergency care  ................. Staff MD note - do ECHO anytime at your convenience - eval for pulmonary hypertension  - change fu to 6 months

## 2015-09-05 ENCOUNTER — Telehealth: Payer: Self-pay | Admitting: Internal Medicine

## 2015-09-05 DIAGNOSIS — R0600 Dyspnea, unspecified: Secondary | ICD-10-CM

## 2015-09-05 DIAGNOSIS — J8410 Pulmonary fibrosis, unspecified: Secondary | ICD-10-CM

## 2015-09-05 NOTE — Telephone Encounter (Signed)
That is fine to order EKG but if chest pain concerning ER best place

## 2015-09-05 NOTE — Telephone Encounter (Signed)
[phone], pt son cb and is wanting [place] to cb and speak to the pt herself

## 2015-09-05 NOTE — Telephone Encounter (Signed)
Spoke with pt's son and he states that pt is having occasional chest pain and L arm pain. They would like an EKG done at the same time as the echo. Pt does not have a cardiologist. Advised son that we will have to send request to MR for approval/denial and we will call him and let him know.   MR please advise if ok to order EKG. Thanks!

## 2015-09-05 NOTE — Telephone Encounter (Signed)
Spoke with patient Donna Watts - daughter-in-law,  Aware of upcoming appt for ECHO and that we are adding on EKG. Order placed. Nothing further needed.

## 2015-09-05 NOTE — Telephone Encounter (Signed)
Per chart pt already has echo scheduled.  LMTCB.

## 2015-09-11 ENCOUNTER — Ambulatory Visit (HOSPITAL_COMMUNITY): Payer: Medicare Other | Attending: Cardiology

## 2015-09-11 ENCOUNTER — Telehealth: Payer: Self-pay | Admitting: Pulmonary Disease

## 2015-09-11 ENCOUNTER — Other Ambulatory Visit: Payer: Self-pay

## 2015-09-11 DIAGNOSIS — I071 Rheumatic tricuspid insufficiency: Secondary | ICD-10-CM | POA: Insufficient documentation

## 2015-09-11 DIAGNOSIS — I272 Pulmonary hypertension, unspecified: Secondary | ICD-10-CM

## 2015-09-11 DIAGNOSIS — I059 Rheumatic mitral valve disease, unspecified: Secondary | ICD-10-CM | POA: Diagnosis not present

## 2015-09-11 DIAGNOSIS — E119 Type 2 diabetes mellitus without complications: Secondary | ICD-10-CM | POA: Insufficient documentation

## 2015-09-11 DIAGNOSIS — I351 Nonrheumatic aortic (valve) insufficiency: Secondary | ICD-10-CM | POA: Insufficient documentation

## 2015-09-11 DIAGNOSIS — J8410 Pulmonary fibrosis, unspecified: Secondary | ICD-10-CM | POA: Diagnosis not present

## 2015-09-11 DIAGNOSIS — I5189 Other ill-defined heart diseases: Secondary | ICD-10-CM | POA: Diagnosis not present

## 2015-09-11 DIAGNOSIS — I517 Cardiomegaly: Secondary | ICD-10-CM | POA: Insufficient documentation

## 2015-09-11 DIAGNOSIS — J9611 Chronic respiratory failure with hypoxia: Secondary | ICD-10-CM

## 2015-09-11 DIAGNOSIS — R0600 Dyspnea, unspecified: Secondary | ICD-10-CM | POA: Diagnosis not present

## 2015-09-11 DIAGNOSIS — I253 Aneurysm of heart: Secondary | ICD-10-CM | POA: Insufficient documentation

## 2015-09-11 NOTE — Telephone Encounter (Signed)
Spoke with [name]-Son  Pt had ECHO today and was supposed to have EKG which was not done.  Pt was told that they could see the notes in the computer that MR wanted an EKG but that there was not an order in and that they could not do it. An order was placed 09/05/15 for the EKG at [facility] location - under ECG tab. Advised the patient that we would work on getting this taken care of and completed soon.  MR, please advise what you would like to do.  Per our office protocol we do not do Nurse Visits any longer, please advise if you would like to bring the patient in this week, 09/13/15, to review ECHO results and do an EKG at that appt.

## 2015-09-11 NOTE — Telephone Encounter (Addendum)
Guys - just reviwed her ECHO report - her PASP is elevcated at 29mHg. She has some form of pulmonary hypertension that either related to her RA or to the fibrosis or both. She needs right heart cath. Therefore, please refer her to Dr DAlton MAundra Dubin. They can do EKG at that time when she sees him. Let her know if pulm htn confirmed there is possibility of medicines to help her dyspnea. If they want urgent EKG - it is urgent care or ER  Dr. MBrand Males M.D., FBaptist [facility] At [facility] BeachC.P Pulmonary and Critical Care Medicine Staff Physician [place] [place] [facility] Pager: [phone] If no answer or between  15:00h - 7:00h: call [phone]  09/11/2015 11:24 PM

## 2015-09-12 NOTE — Telephone Encounter (Signed)
Spoke with pt's son. He is aware of the below information. Orders will be placed for right heart cath and EKG. Nothing further was needed.

## 2015-09-14 ENCOUNTER — Telehealth (HOSPITAL_COMMUNITY): Payer: Self-pay | Admitting: Vascular Surgery

## 2015-09-14 NOTE — Telephone Encounter (Signed)
Pt referred for right heart cath

## 2015-09-17 NOTE — Telephone Encounter (Signed)
Pts son called and asked if pt could see Dr.Bensimhon not Dr.McLean also wanted to have a left heart cath with her right heart cath.  Per heather patient would have to have new patient appointment office visit and be evaluated by the doctor to do anything other than the [place].

## 2015-09-19 ENCOUNTER — Telehealth (HOSPITAL_COMMUNITY): Payer: Self-pay | Admitting: Surgery

## 2015-09-19 NOTE — Telephone Encounter (Signed)
Son called in reference to patients referral from Ramaswamy's office for [place].  Message sent to [facility] Coordinator for follow-up and to schedule RHC.

## 2015-09-20 ENCOUNTER — Telehealth: Payer: Self-pay | Admitting: Internal Medicine

## 2015-09-20 NOTE — Telephone Encounter (Signed)
Spoke with pt's son. He is aware that we are still working on getting the pt an appointment. Libby sent another staff message to the heart clinic to get this appointment schedule this AM. Pt's son was appreciative that we are working on this.

## 2015-09-21 ENCOUNTER — Telehealth: Payer: Self-pay | Admitting: Internal Medicine

## 2015-09-21 ENCOUNTER — Telehealth (HOSPITAL_COMMUNITY): Payer: Self-pay | Admitting: Cardiology

## 2015-09-21 NOTE — Telephone Encounter (Signed)
Patients son called to inquire about appt scheduling After reviewing patients record and speaking with clinic coordinator Kevan Rosebush, RN Patient was to have [place] scheduled per [facility] however prior to scheduling, patients son also requested that a LHC be done as well, patients son was then advised that if he is requesting additional procedure other than what was already ordered by Dr Lynford Citizen, patient will need a OV to discuss with CHF physicians  Patients son was to discuss with patient/ Ramasawmy office and return call  Patients son now checking status of appointment- he was again advised  We would be more than happy to schedule RHC only as ordered however if a LHC is still being requested patient will need a OV  Next available 10/25/15 @ 1140 New patient packet in the mail

## 2015-09-21 NOTE — Telephone Encounter (Signed)
Spoke with pt's son, [name]. He was calling about getting the pt scheduled for her right heart cath. His brother has been calling cardiology trying to have a right and left heart cath done. They were told that if they wanted to do that then they would need a consultation first. [name] states that his mother does not want to do that, she only wants to have a right heart cath done. Tretha Sciara that we have not changed any of the orders, all we have documented is a right heart cath. He states that cardiology advised him that they would be in touch with them on Tuesday to get this procedure scheduled. [name] did not need anything from [place], he just wanted to make sure we were up to date on what was going on. Nothing further was needed at this time.

## 2015-10-01 ENCOUNTER — Telehealth (HOSPITAL_COMMUNITY): Payer: Self-pay | Admitting: *Deleted

## 2015-10-01 ENCOUNTER — Other Ambulatory Visit (HOSPITAL_COMMUNITY): Payer: Self-pay | Admitting: *Deleted

## 2015-10-01 DIAGNOSIS — R0600 Dyspnea, unspecified: Secondary | ICD-10-CM

## 2015-10-01 NOTE — Telephone Encounter (Signed)
Pt's son [name] left a VM stating they wanted to sch RHC instead of waiting for pt to be seen by Dr Haroldine Laws.  Called and spoke w/pt herself she states needs test done and ask that I speak w/her son [name] and she put him on the phone.  RHC sch w/Dr Bensimhon for Thur 6/8 at 9 am.  Instructions reviewed w/him via phone

## 2015-10-03 ENCOUNTER — Telehealth (HOSPITAL_COMMUNITY): Payer: Self-pay | Admitting: Vascular Surgery

## 2015-10-03 NOTE — Telephone Encounter (Signed)
Pt son called he wants to cancel cath for 10/04/15 w/Dan , want to reschedule ASAP.Marland Kitchen PLEASE ADVISE

## 2015-10-03 NOTE — Telephone Encounter (Signed)
Spoke w/pt's daughter in law she states pt had bad panic attack today and does not want to have [place] tomorrow, she request we cx it for tomorrow and she will c/b next week to resch.  Cath sch for tomorrow

## 2015-10-04 ENCOUNTER — Encounter (HOSPITAL_COMMUNITY): Admission: RE | Payer: Self-pay | Source: Ambulatory Visit

## 2015-10-04 ENCOUNTER — Inpatient Hospital Stay (HOSPITAL_COMMUNITY): Admission: RE | Admit: 2015-10-04 | Payer: Medicare Other | Source: Ambulatory Visit

## 2015-10-04 ENCOUNTER — Ambulatory Visit (HOSPITAL_COMMUNITY): Admission: RE | Admit: 2015-10-04 | Payer: Medicare Other | Source: Ambulatory Visit | Admitting: Internal Medicine

## 2015-10-04 SURGERY — RIGHT HEART CATH

## 2015-10-15 ENCOUNTER — Telehealth: Payer: Self-pay | Admitting: Internal Medicine

## 2015-10-15 NOTE — Telephone Encounter (Signed)
Spoke with pt's son, [name]. Advised him that medication for PAH is not started until after the heart cath is done. Explained to him that we wait until the heart cath is done to prove that she has [place]. Insurance companies will need this documentation in order to cover medications. He verbalized understanding. Pt is scheduled for a heart cath on 10/25/15. Nothing further was needed.

## 2015-10-25 ENCOUNTER — Ambulatory Visit (HOSPITAL_COMMUNITY)
Admission: RE | Admit: 2015-10-25 | Discharge: 2015-10-25 | Disposition: A | Discharge status: Home / Self Care | Payer: Medicare Other | Source: Ambulatory Visit | Attending: Internal Medicine | Admitting: Internal Medicine

## 2015-10-25 ENCOUNTER — Encounter (HOSPITAL_COMMUNITY): Payer: Self-pay

## 2015-10-25 ENCOUNTER — Telehealth (HOSPITAL_COMMUNITY): Payer: Self-pay

## 2015-10-25 DIAGNOSIS — J9610 Chronic respiratory failure, unspecified whether with hypoxia or hypercapnia: Secondary | ICD-10-CM | POA: Insufficient documentation

## 2015-10-25 DIAGNOSIS — I272 Other secondary pulmonary hypertension: Secondary | ICD-10-CM | POA: Diagnosis not present

## 2015-10-25 DIAGNOSIS — R0902 Hypoxemia: Secondary | ICD-10-CM

## 2015-10-25 DIAGNOSIS — R0989 Other specified symptoms and signs involving the circulatory and respiratory systems: Secondary | ICD-10-CM

## 2015-10-25 DIAGNOSIS — R0600 Dyspnea, unspecified: Secondary | ICD-10-CM | POA: Diagnosis not present

## 2015-10-25 DIAGNOSIS — J9611 Chronic respiratory failure with hypoxia: Secondary | ICD-10-CM

## 2015-10-25 DIAGNOSIS — K219 Gastro-esophageal reflux disease without esophagitis: Secondary | ICD-10-CM | POA: Diagnosis not present

## 2015-10-25 DIAGNOSIS — I2510 Atherosclerotic heart disease of native coronary artery without angina pectoris: Secondary | ICD-10-CM | POA: Diagnosis not present

## 2015-10-25 DIAGNOSIS — J8410 Pulmonary fibrosis, unspecified: Secondary | ICD-10-CM

## 2015-10-25 DIAGNOSIS — J84112 Idiopathic pulmonary fibrosis: Secondary | ICD-10-CM | POA: Diagnosis not present

## 2015-10-25 DIAGNOSIS — R059 Cough, unspecified: Secondary | ICD-10-CM

## 2015-10-25 DIAGNOSIS — E785 Hyperlipidemia, unspecified: Secondary | ICD-10-CM | POA: Diagnosis not present

## 2015-10-25 DIAGNOSIS — M069 Rheumatoid arthritis, unspecified: Secondary | ICD-10-CM | POA: Insufficient documentation

## 2015-10-25 DIAGNOSIS — Z7982 Long term (current) use of aspirin: Secondary | ICD-10-CM | POA: Diagnosis not present

## 2015-10-25 DIAGNOSIS — R05 Cough: Secondary | ICD-10-CM | POA: Diagnosis not present

## 2015-10-25 DIAGNOSIS — Z7984 Long term (current) use of oral hypoglycemic drugs: Secondary | ICD-10-CM | POA: Diagnosis not present

## 2015-10-25 DIAGNOSIS — E119 Type 2 diabetes mellitus without complications: Secondary | ICD-10-CM | POA: Diagnosis not present

## 2015-10-25 DIAGNOSIS — Z85828 Personal history of other malignant neoplasm of skin: Secondary | ICD-10-CM | POA: Diagnosis not present

## 2015-10-25 DIAGNOSIS — Z7682 Awaiting organ transplant status: Secondary | ICD-10-CM

## 2015-10-25 LAB — COMPREHENSIVE METABOLIC PANEL
ALK PHOS: 45 U/L (ref 38–126)
ALT: 19 U/L (ref 14–54)
AST: 26 U/L (ref 15–41)
Albumin: 4.1 g/dL (ref 3.5–5.0)
Anion gap: 9 (ref 5–15)
BUN: 9 mg/dL (ref 6–20)
CALCIUM: 9.6 mg/dL (ref 8.9–10.3)
CO2: 30 mmol/L (ref 22–32)
CREATININE: 0.54 mg/dL (ref 0.44–1.00)
Chloride: 98 mmol/L — ABNORMAL LOW (ref 101–111)
Glucose, Bld: 110 mg/dL — ABNORMAL HIGH (ref 65–99)
Potassium: 4.9 mmol/L (ref 3.5–5.1)
Sodium: 137 mmol/L (ref 135–145)
Total Bilirubin: 0.7 mg/dL (ref 0.3–1.2)
Total Protein: 8.1 g/dL (ref 6.5–8.1)

## 2015-10-25 LAB — CBC
HCT: 40.6 % (ref 36.0–46.0)
HEMOGLOBIN: 12.3 g/dL (ref 12.0–15.0)
MCH: 28.1 pg (ref 26.0–34.0)
MCHC: 30.3 g/dL (ref 30.0–36.0)
MCV: 92.7 fL (ref 78.0–100.0)
Platelets: 196 10*3/uL (ref 150–400)
RBC: 4.38 MIL/uL (ref 3.87–5.11)
RDW: 14.4 % (ref 11.5–15.5)
WBC: 9.4 10*3/uL (ref 4.0–10.5)

## 2015-10-25 LAB — PROTIME-INR
INR: 0.88 (ref 0.00–1.49)
PROTHROMBIN TIME: 12.2 s (ref 11.6–15.2)

## 2015-10-25 NOTE — Patient Instructions (Addendum)
You have been scheduled for a left and right heart catheterization next week with Dr. Haroldine Laws. See attached instruction sheet for additional details.  Radiology will contact you to schedule a high resolution CT of your chest at [facility].  Follow up with Dr. Haroldine Laws in 2 months.  Do the following things EVERYDAY: 1) Weigh yourself in the morning before breakfast. Write it down and keep it in a log. 2) Take your medicines as prescribed 3) Eat low salt foods-Limit salt (sodium) to 2000 mg per day.  4) Stay as active as you can everyday 5) Limit all fluids for the day to less than 2 liters

## 2015-10-25 NOTE — Progress Notes (Signed)
Patient ID: Donna Watts, female   DOB: Feb 23, 1930, 82 y.o.   MRN: [id number]    Pulmonary Hypertension Clinic Note   Referring Physician: Dr. Chase Caller.  Primary Care: Primary Cardiologist: New.  HPI:  Donna Watts is a 82 y.o. female with history of DM2, RA and pulmonary fibrosis. She has been referred by Dr. Chase Caller for evaluation for [place] for further evaluation PAH.   Started on oxygen about 5 years ago. Recently more SOB with increased O2 demand with 3L at rest and up to 8-10L with minimal activity. Has some pressure in her chest. Worse when she coughs.  Last chest CT 2014. Reviewed personally. Marked progression of ILD with significant 3v coronary calcium    Echo 09/11/15 with LVEF 55-60%, grade 1 DD, PA peak pressure 65 mm Hg  PFTs 07/20/15 FVC 1.37 (73%), FEV1 1.10 81 %, DLCO 4.31 (25%)  Hx of pulmonary work up #RA-ILD/UIP pattern -h/o cough onset around 2005 with a cxr showing scarring per Dr Inda Merlin office records c/w PF -Diagnosed RA with arthritis and ? EN summer 2014 - First CT chest June 2010: R> L UIP pattern -Followup CT chest June 2014 - R > L UIP pattern much worse - PFT's November 09, 2008 VC 2.03 ( 92%) DLC0 52%  - PFT's October 25, 2009 VC 2.0 (92%) DLC0 58%  -PFT's 09/25/10 VC1.88 (86%) DLCO 54 - PFT May 2014: FVC 1.3L/67%, DLCO 6.4/36%- > started on CellCept in March 2015. Donna Watts May 2015: FVC of 1.34/71% - PFT August 2015: VC 1.3. The/69%. FEV1 1.2 L/87%. Ratio of 92/125%. DLCO of 6.4/36%.  Review of Systems: [y] = yes, _0  = no   General: Weight gain _1 ; Weight loss _2 ; Anorexia _3 ; Fatigue Blue.Reese ]; Fever _4 ; Chills _5 ; Weakness Blue.Reese ]  Cardiac: Chest pain/pressure _6 ; Resting SOB Blue.Reese ]; Exertional SOB [ y]; Orthopnea _7 ; Pedal Edema _8 ; Palpitations _9 ; Syncope _10 ; Presyncope _11 ; Paroxysmal nocturnal dyspnea_12   Pulmonary: Cough _13 ; Wheezing_14 ; Hemoptysis_15 ; Sputum _16 ; Snoring _17   GI: Vomiting_18 ; Dysphagia_19 ; Melena_20 ; Hematochezia  _21 ; Heartburn_22 ; Abdominal pain _23 ; Constipation _24 ; Diarrhea _25 ; BRBPR _26   GU: Hematuria_27 ; Dysuria _28 ; Nocturia_29   Vascular: Pain in legs with walking _30 ; Pain in feet with lying flat _31 ; Non-healing sores _32 ; Stroke _33 ; TIA _34 ; Slurred speech _35 ;  Neuro: Headaches_36 ; Vertigo_37 ; Seizures_38 ; Paresthesias_39 ;Blurred vision _40 ; Diplopia _41 ; Vision changes _42   Ortho/Skin: Arthritis Blue.Reese ]; Joint pain Blue.Reese ]; Muscle pain _43 ; Joint swelling _44 ; Back Pain _45 ; Rash _46   Psych: Depression_47 ; Anxiety_48   Heme: Bleeding problems _49 ; Clotting disorders _50 ; Anemia _51   Endocrine: Diabetes [ y]; Thyroid dysfunction_52    Past Medical History  Diagnosis Date  . Other and unspecified hyperlipidemia   . Esophageal reflux   . Pulmonary fibrosis ([place])   . Diabetes mellitus without complication ([place])   . Acid reflux   . Rheumatoid arthritis ([place])   . Cancer [facility])     precancerous skin    Current Outpatient Prescriptions  Medication Sig Dispense Refill  . albuterol (PROVENTIL HFA;VENTOLIN HFA) 108 (90 Base) MCG/ACT inhaler Inhale 2 puffs into the lungs every 6 (six) hours as needed for wheezing or shortness of breath.    . ALPRAZolam (XANAX) 0.25  MG tablet Take 0.25 mg by mouth as needed for anxiety.     Marland Kitchen aspirin EC 81 MG tablet Take 81 mg by mouth daily.    . budesonide-formoterol (SYMBICORT) 160-4.5 MCG/ACT inhaler Inhale 2 puffs into the lungs 2 (two) times daily.    . Cholecalciferol (VITAMIN D) 2000 UNITS CAPS Take 2,000 Units by mouth daily.     . clonazePAM (KLONOPIN) 0.5 MG tablet Take 0.5 mg by mouth at bedtime as needed for anxiety.     Marland Kitchen dextromethorphan-guaiFENesin (MUCINEX DM) 30-600 MG per 12 hr tablet Take 1 tablet by mouth every 12 (twelve) hours as needed for cough.     . ferrous sulfate 325 (65 FE) MG tablet Take 325 mg by mouth daily with breakfast.    . fluticasone (FLONASE) 50 MCG/ACT nasal spray Place 2 sprays into both nostrils daily as needed for allergies  or rhinitis.    Marland Kitchen HYDROcodone-acetaminophen (NORCO/VICODIN) 5-325 MG tablet Take 1 tablet by mouth every 6 (six) hours as needed (For pain.).     Marland Kitchen metFORMIN (GLUCOPHAGE-XR) 500 MG 24 hr tablet Take 1,000 mg by mouth daily with supper.     . Multiple Vitamins-Minerals (CENTRUM SILVER PO) Take 1 tablet by mouth daily.      . mycophenolate (CELLCEPT) 500 MG tablet Take 500 mg by mouth 2 (two) times daily.     Marland Kitchen omeprazole (PRILOSEC) 20 MG capsule Take 20 mg by mouth daily as needed (For heartburn or acid reflux.).     Marland Kitchen PARoxetine (PAXIL) 10 MG tablet Take 10 mg by mouth daily as needed (For depression.).     Marland Kitchen predniSONE (DELTASONE) 1 MG tablet Take 3 tablets by mouth daily.     . predniSONE (DELTASONE) 5 MG tablet Take 5 mg by mouth at bedtime.     . simvastatin (ZOCOR) 20 MG tablet Take 20 mg by mouth at bedtime.      Marland Kitchen ibuprofen (ADVIL,MOTRIN) 200 MG tablet Take 600 mg by mouth every 6 (six) hours as needed (For pain.). Reported on 10/25/2015     No current facility-administered medications for this encounter.    Allergies  Allergen Reactions  . Tramadol Rash, Other (See Comments) and Hypertension    Dizziness       Social History   Social History  . Marital Status: Widowed    Spouse Name: N/A  . Number of Children: 3  . Years of Education: 12   Occupational History  . Retired     na   Social History Main Topics  . Smoking status: Never Smoker   . Smokeless tobacco: Never Used  . Alcohol Use: No  . Drug Use: No  . Sexual Activity: Not on file   Other Topics Concern  . Not on file   Social History Narrative   Lives with son   Caffeine use- coffee- 2 cups daily      Family History  Problem Relation Age of Onset  . Heart disease Brother      PHYSICAL EXAM: 118/70  95  sats 93% on 3L  General:  Elderly. SOB at rest. Anxious HEENT: normal Neck: supple. JVP 9. Carotids 2+ bilat; no bruits. No lymphadenopathy or thryomegaly appreciated. Cor: PMI nondisplaced.  Regular rate & rhythm. 2/6TR increased p2 Lungs: diffuse crackles  Abdomen: soft, nontender, nondistended. No hepatosplenomegaly. No bruits or masses. Good bowel sounds. Extremities: no cyanosis, clubbing, rash, edema + swan neck deformity and ulnar deviation  Neuro: alert & oriented x 3, cranial  nerves grossly intact. moves all 4 extremities w/o difficulty. Affect pleasant.   ASSESSMENT & PLAN:  1. Chronic respiratory failure in setting of ILD/pulmonary fibrosis likely 2/2 RA 2. Severe coronary calcifications on CT chest  3. DM2 4. Worsening dyspnea  She has severe ILD and suspect most of her symptoms are related to this. However also evidence of PH on echo and severe CAD on chest CT. Will proceed with R & L heart cath next week. Will also repeat hi-res chest CT. I discussed with Dr. Chase Caller.   If she does have significant PAH will have to be very careful with initiation of selection pulmonary vasodilators due to risk of shunting with severe ILD. May consider Tyvaso.   Donna Lichtman,MD 4:07 PM

## 2015-10-25 NOTE — Progress Notes (Signed)
Advanced Heart Failure Medication Review by a Pharmacist  Does the patient  feel that his/her medications are working for him/her?  yes  Has the patient been experiencing any side effects to the medications prescribed?  no  Does the patient measure his/her own blood pressure or blood glucose at home?  no   Does the patient have any problems obtaining medications due to transportation or finances?   no  Understanding of regimen: good Understanding of indications: good Potential of compliance: good Patient understands to avoid NSAIDs. Patient understands to avoid decongestants.  Issues to address at subsequent visits: None   Pharmacist comments:  Donna Watts is pleasant 82 yo F from [place] presenting with her son and a current medication list. She reports good compliance with her regimen and did not have any specific medication-related questions or concerns for me at this time.   [name]. Velva Harman, PharmD, BCPS, CPP Clinical Pharmacist Pager: [phone] Phone: [phone] 10/25/2015 12:06 PM      Time with patient: 10 minutes Preparation and documentation time: 2 minutes Total time: 12 minutes

## 2015-10-25 NOTE — Telephone Encounter (Addendum)
Patient's son made aware of STAT high resolution CT chest scheduled for tomorrow 10/26/15 at 10:00am. Radiology made aware of lab results from today (Cr 0.54), Hx DM with metformin, and no known contrast allergy. Advised no prep is needed per radiology dept and to arrive at main entrance of [facility] for admissions check-in at 9:30 am tomorrow. Aware and agreeable.  Renee Pain

## 2015-10-26 ENCOUNTER — Ambulatory Visit (HOSPITAL_COMMUNITY)
Admission: RE | Admit: 2015-10-26 | Discharge: 2015-10-26 | Disposition: A | Discharge status: Home / Self Care | Payer: Medicare Other | Source: Ambulatory Visit | Attending: Internal Medicine | Admitting: Internal Medicine

## 2015-10-26 DIAGNOSIS — I700 Atherosclerosis of aorta: Secondary | ICD-10-CM | POA: Insufficient documentation

## 2015-10-26 DIAGNOSIS — R05 Cough: Secondary | ICD-10-CM

## 2015-10-26 DIAGNOSIS — R918 Other nonspecific abnormal finding of lung field: Secondary | ICD-10-CM | POA: Insufficient documentation

## 2015-10-26 DIAGNOSIS — I272 Other secondary pulmonary hypertension: Secondary | ICD-10-CM | POA: Diagnosis not present

## 2015-10-26 DIAGNOSIS — R059 Cough, unspecified: Secondary | ICD-10-CM

## 2015-10-26 DIAGNOSIS — I2510 Atherosclerotic heart disease of native coronary artery without angina pectoris: Secondary | ICD-10-CM | POA: Diagnosis not present

## 2015-10-26 DIAGNOSIS — I358 Other nonrheumatic aortic valve disorders: Secondary | ICD-10-CM | POA: Diagnosis not present

## 2015-10-29 ENCOUNTER — Encounter (HOSPITAL_COMMUNITY)
Admission: RE | Disposition: A | Discharge status: Home / Self Care | Payer: Self-pay | Source: Ambulatory Visit | Attending: Internal Medicine

## 2015-10-29 ENCOUNTER — Ambulatory Visit (HOSPITAL_COMMUNITY)
Admission: RE | Admit: 2015-10-29 | Discharge: 2015-10-29 | Disposition: A | Discharge status: Home / Self Care | Payer: Medicare Other | Source: Ambulatory Visit | Attending: Internal Medicine | Admitting: Internal Medicine

## 2015-10-29 DIAGNOSIS — Z8249 Family history of ischemic heart disease and other diseases of the circulatory system: Secondary | ICD-10-CM | POA: Insufficient documentation

## 2015-10-29 DIAGNOSIS — J84112 Idiopathic pulmonary fibrosis: Secondary | ICD-10-CM | POA: Diagnosis not present

## 2015-10-29 DIAGNOSIS — J9610 Chronic respiratory failure, unspecified whether with hypoxia or hypercapnia: Secondary | ICD-10-CM | POA: Insufficient documentation

## 2015-10-29 DIAGNOSIS — M069 Rheumatoid arthritis, unspecified: Secondary | ICD-10-CM | POA: Insufficient documentation

## 2015-10-29 DIAGNOSIS — I272 Other secondary pulmonary hypertension: Secondary | ICD-10-CM

## 2015-10-29 DIAGNOSIS — E785 Hyperlipidemia, unspecified: Secondary | ICD-10-CM | POA: Insufficient documentation

## 2015-10-29 DIAGNOSIS — Z7984 Long term (current) use of oral hypoglycemic drugs: Secondary | ICD-10-CM | POA: Insufficient documentation

## 2015-10-29 DIAGNOSIS — Z7982 Long term (current) use of aspirin: Secondary | ICD-10-CM | POA: Diagnosis not present

## 2015-10-29 DIAGNOSIS — I2510 Atherosclerotic heart disease of native coronary artery without angina pectoris: Secondary | ICD-10-CM | POA: Insufficient documentation

## 2015-10-29 DIAGNOSIS — E119 Type 2 diabetes mellitus without complications: Secondary | ICD-10-CM | POA: Insufficient documentation

## 2015-10-29 DIAGNOSIS — K219 Gastro-esophageal reflux disease without esophagitis: Secondary | ICD-10-CM | POA: Insufficient documentation

## 2015-10-29 DIAGNOSIS — R0600 Dyspnea, unspecified: Secondary | ICD-10-CM

## 2015-10-29 DIAGNOSIS — I2721 Secondary pulmonary arterial hypertension: Secondary | ICD-10-CM | POA: Insufficient documentation

## 2015-10-29 LAB — POCT I-STAT 3, ART BLOOD GAS (G3+)
ACID-BASE EXCESS: 8 mmol/L — AB (ref 0.0–2.0)
BICARBONATE: 33.7 meq/L — AB (ref 20.0–24.0)
O2 Saturation: 97 %
PO2 ART: 95 mmHg (ref 80.0–100.0)
TCO2: 35 mmol/L (ref 0–100)
pCO2 arterial: 54.6 mmHg — ABNORMAL HIGH (ref 35.0–45.0)
pH, Arterial: 7.398 (ref 7.350–7.450)

## 2015-10-29 LAB — POCT I-STAT 3, VENOUS BLOOD GAS (G3P V)
Acid-Base Excess: 12 mmol/L — ABNORMAL HIGH (ref 0.0–2.0)
Acid-Base Excess: 8 mmol/L — ABNORMAL HIGH (ref 0.0–2.0)
BICARBONATE: 35.1 meq/L — AB (ref 20.0–24.0)
BICARBONATE: 39.5 meq/L — AB (ref 20.0–24.0)
O2 Saturation: 63 %
O2 Saturation: 66 %
PCO2 VEN: 63.4 mmHg — AB (ref 45.0–50.0)
PO2 VEN: 36 mmHg (ref 31.0–45.0)
TCO2: 37 mmol/L (ref 0–100)
TCO2: 41 mmol/L (ref 0–100)
pCO2, Ven: 60.8 mmHg — ABNORMAL HIGH (ref 45.0–50.0)
pH, Ven: 7.369 — ABNORMAL HIGH (ref 7.250–7.300)
pH, Ven: 7.403 — ABNORMAL HIGH (ref 7.250–7.300)
pO2, Ven: 35 mmHg (ref 31.0–45.0)

## 2015-10-29 LAB — GLUCOSE, CAPILLARY
GLUCOSE-CAPILLARY: 120 mg/dL — AB (ref 65–99)
Glucose-Capillary: 102 mg/dL — ABNORMAL HIGH (ref 65–99)

## 2015-10-29 SURGERY — RIGHT HEART CATH AND CORONARY ANGIOGRAPHY

## 2015-10-29 MED ORDER — LIDOCAINE HCL (PF) 1 % IJ SOLN
INTRAMUSCULAR | Status: DC | PRN
  Administered 2015-10-29: 2 mL
  Administered 2015-10-29: 17 mL
  Administered 2015-10-29: 1 mL

## 2015-10-29 MED ORDER — IOPAMIDOL (ISOVUE-370) INJECTION 76%
INTRAVENOUS | Status: DC | PRN
  Administered 2015-10-29: 30 mL via INTRA_ARTERIAL

## 2015-10-29 MED ORDER — SODIUM CHLORIDE 0.9% FLUSH: 3.0000 mL | Freq: Two times a day (BID) | INTRAVENOUS | Status: DC

## 2015-10-29 MED ORDER — SODIUM CHLORIDE 0.9 % IV SOLN
INTRAVENOUS | Status: DC
  Administered 2015-10-29: 07:00:00 via INTRAVENOUS

## 2015-10-29 MED ORDER — ONDANSETRON HCL 4 MG/2ML IJ SOLN: 4.0000 mg | Freq: Four times a day (QID) | INTRAMUSCULAR | Status: DC | PRN

## 2015-10-29 MED ORDER — IOPAMIDOL (ISOVUE-370) INJECTION 76%
INTRAVENOUS | Status: AC
  Filled 2015-10-29: qty 100

## 2015-10-29 MED ORDER — SODIUM CHLORIDE 0.9 % IV SOLN: 250.0000 mL | INTRAVENOUS | Status: DC | PRN

## 2015-10-29 MED ORDER — SODIUM CHLORIDE 0.9 % IV SOLN: INTRAVENOUS | Status: AC

## 2015-10-29 MED ORDER — LIDOCAINE HCL (PF) 1 % IJ SOLN
INTRAMUSCULAR | Status: AC
  Filled 2015-10-29: qty 30

## 2015-10-29 MED ORDER — SODIUM CHLORIDE 0.9% FLUSH: 3.0000 mL | INTRAVENOUS | Status: DC | PRN

## 2015-10-29 MED ORDER — HEPARIN (PORCINE) IN NACL 2-0.9 UNIT/ML-% IJ SOLN
INTRAMUSCULAR | Status: DC | PRN
  Administered 2015-10-29: 1000 mL

## 2015-10-29 MED ORDER — HEPARIN (PORCINE) IN NACL 2-0.9 UNIT/ML-% IJ SOLN
INTRAMUSCULAR | Status: AC
  Filled 2015-10-29: qty 1000

## 2015-10-29 MED ORDER — ASPIRIN 81 MG PO CHEW
81.0000 mg | CHEWABLE_TABLET | ORAL | Status: AC
  Administered 2015-10-29: 81 mg via ORAL

## 2015-10-29 MED ORDER — ASPIRIN 81 MG PO CHEW
CHEWABLE_TABLET | ORAL | Status: AC
  Filled 2015-10-29: qty 1

## 2015-10-29 MED ORDER — VERAPAMIL HCL 2.5 MG/ML IV SOLN
INTRAVENOUS | Status: AC
  Filled 2015-10-29: qty 2

## 2015-10-29 MED ORDER — ACETAMINOPHEN 325 MG PO TABS: 650.0000 mg | ORAL_TABLET | ORAL | Status: DC | PRN

## 2015-10-29 SURGICAL SUPPLY — 13 items
CATH BALLN WEDGE 5F 110CM (CATHETERS) ×2 IMPLANT
CATH INFINITI 5FR MULTPACK ANG (CATHETERS) ×2 IMPLANT
GLIDESHEATH SLEND SS 6F .021 (SHEATH) ×2 IMPLANT
GUIDEWIRE .025 260CM (WIRE) ×2 IMPLANT
KIT HEART LEFT (KITS) ×3 IMPLANT
PACK CARDIAC CATHETERIZATION (CUSTOM PROCEDURE TRAY) ×3 IMPLANT
SHEATH FAST CATH BRACH 5F 5CM (SHEATH) ×2 IMPLANT
SHEATH PINNACLE 5F 10CM (SHEATH) ×3 IMPLANT
TRANSDUCER W/STOPCOCK (MISCELLANEOUS) ×4 IMPLANT
TUBING ART PRESS 72  MALE/FEM (TUBING) ×2
TUBING ART PRESS 72 MALE/FEM (TUBING) ×1 IMPLANT
TUBING CIL FLEX 10 FLL-RA (TUBING) ×3 IMPLANT
WIRE EMERALD 3MM-J .035X150CM (WIRE) ×3 IMPLANT

## 2015-10-29 NOTE — H&P (View-Only) (Signed)
Patient ID: Donna Watts, female   DOB: 11/27/1929, 82 y.o.   MRN: [id number]    Pulmonary Hypertension Clinic Note   Referring Physician: Dr. Ramaswamy.  Primary Care: Primary Cardiologist: New.  HPI:  Donna Watts is a 82 y.o. female with history of DM2, RA and pulmonary fibrosis. She has been referred by Dr. Ramaswamy for evaluation for RHC for further evaluation PAH.   Started on oxygen about 5 years ago. Recently more SOB with increased O2 demand with 3L at rest and up to 8-10L with minimal activity. Has some pressure in her chest. Worse when she coughs.  Last chest CT 2014. Reviewed personally. Marked progression of ILD with significant 3v coronary calcium    Echo 09/11/15 with LVEF 55-60%, grade 1 DD, PA peak pressure 65 mm Hg  PFTs 07/20/15 FVC 1.37 (73%), FEV1 1.10 81 %, DLCO 4.31 (25%)  Hx of pulmonary work up #RA-ILD/UIP pattern -h/o cough onset around 2005 with a cxr showing scarring per Dr Gates office records c/w PF -Diagnosed RA with arthritis and ? EN summer 2014 - First CT chest June 2010: R> L UIP pattern -Followup CT chest June 2014 - R > L UIP pattern much worse - PFT's November 09, 2008 VC 2.03 ( 92%) DLC0 52%  - PFT's October 25, 2009 VC 2.0 (92%) DLC0 58%  -PFT's 09/25/10 VC1.88 (86%) DLCO 54 - PFT May 2014: FVC 1.3L/67%, DLCO 6.4/36%- > started on CellCept in March 2015. - Spiro May 2015: FVC of 1.34/71% - PFT August 2015: VC 1.3. The/69%. FEV1 1.2 L/87%. Ratio of 92/125%. DLCO of 6.4/36%.  Review of Systems: [y] = yes, [ ] = no   General: Weight gain [ ]; Weight loss [ ]; Anorexia [ ]; Fatigue [y ]; Fever [ ]; Chills [ ]; Weakness [y ]  Cardiac: Chest pain/pressure [ ]; Resting SOB [y ]; Exertional SOB [ y]; Orthopnea [ ]; Pedal Edema [ ]; Palpitations [ ]; Syncope [ ]; Presyncope [ ]; Paroxysmal nocturnal dyspnea[ ]  Pulmonary: Cough [ ]; Wheezing[ ]; Hemoptysis[ ]; Sputum [ ]; Snoring [ ]  GI: Vomiting[ ]; Dysphagia[ ]; Melena[ ]; Hematochezia  [ ]; Heartburn[ ]; Abdominal pain [ ]; Constipation [ ]; Diarrhea [ ]; BRBPR [ ]  GU: Hematuria[ ]; Dysuria [ ]; Nocturia[ ]  Vascular: Pain in legs with walking [ ]; Pain in feet with lying flat [ ]; Non-healing sores [ ]; Stroke [ ]; TIA [ ]; Slurred speech [ ];  Neuro: Headaches[ ]; Vertigo[ ]; Seizures[ ]; Paresthesias[ ];Blurred vision [ ]; Diplopia [ ]; Vision changes [ ]  Ortho/Skin: Arthritis [y ]; Joint pain [y ]; Muscle pain [ ]; Joint swelling [ ]; Back Pain [ ]; Rash [ ]  Psych: Depression[ ]; Anxiety[ ]  Heme: Bleeding problems [ ]; Clotting disorders [ ]; Anemia [ ]  Endocrine: Diabetes [ y]; Thyroid dysfunction[ ]   Past Medical History  Diagnosis Date  . Other and unspecified hyperlipidemia   . Esophageal reflux   . Pulmonary fibrosis ([facility])   . Diabetes mellitus without complication ([facility])   . Acid reflux   . Rheumatoid arthritis ([facility])   . Cancer ([facility])     precancerous skin    Current Outpatient Prescriptions  Medication Sig Dispense Refill  . albuterol (PROVENTIL HFA;VENTOLIN HFA) 108 (90 Base) MCG/ACT inhaler Inhale 2 puffs into the lungs every 6 (six) hours as needed for wheezing or shortness of breath.    . ALPRAZolam (XANAX) 0.25   MG tablet Take 0.25 mg by mouth as needed for anxiety.     Marland Kitchen aspirin EC 81 MG tablet Take 81 mg by mouth daily.    . budesonide-formoterol (SYMBICORT) 160-4.5 MCG/ACT inhaler Inhale 2 puffs into the lungs 2 (two) times daily.    . Cholecalciferol (VITAMIN D) 2000 UNITS CAPS Take 2,000 Units by mouth daily.     . clonazePAM (KLONOPIN) 0.5 MG tablet Take 0.5 mg by mouth at bedtime as needed for anxiety.     Marland Kitchen dextromethorphan-guaiFENesin (MUCINEX DM) 30-600 MG per 12 hr tablet Take 1 tablet by mouth every 12 (twelve) hours as needed for cough.     . ferrous sulfate 325 (65 FE) MG tablet Take 325 mg by mouth daily with breakfast.    . fluticasone (FLONASE) 50 MCG/ACT nasal spray Place 2 sprays into both nostrils daily as needed for allergies  or rhinitis.    Marland Kitchen HYDROcodone-acetaminophen (NORCO/VICODIN) 5-325 MG tablet Take 1 tablet by mouth every 6 (six) hours as needed (For pain.).     Marland Kitchen metFORMIN (GLUCOPHAGE-XR) 500 MG 24 hr tablet Take 1,000 mg by mouth daily with supper.     . Multiple Vitamins-Minerals (CENTRUM SILVER PO) Take 1 tablet by mouth daily.      . mycophenolate (CELLCEPT) 500 MG tablet Take 500 mg by mouth 2 (two) times daily.     Marland Kitchen omeprazole (PRILOSEC) 20 MG capsule Take 20 mg by mouth daily as needed (For heartburn or acid reflux.).     Marland Kitchen PARoxetine (PAXIL) 10 MG tablet Take 10 mg by mouth daily as needed (For depression.).     Marland Kitchen predniSONE (DELTASONE) 1 MG tablet Take 3 tablets by mouth daily.     . predniSONE (DELTASONE) 5 MG tablet Take 5 mg by mouth at bedtime.     . simvastatin (ZOCOR) 20 MG tablet Take 20 mg by mouth at bedtime.      Marland Kitchen ibuprofen (ADVIL,MOTRIN) 200 MG tablet Take 600 mg by mouth every 6 (six) hours as needed (For pain.). Reported on 10/25/2015     No current facility-administered medications for this encounter.    Allergies  Allergen Reactions  . Tramadol Rash, Other (See Comments) and Hypertension    Dizziness       Social History   Social History  . Marital Status: Widowed    Spouse Name: N/A  . Number of Children: 3  . Years of Education: 12   Occupational History  . Retired     na   Social History Main Topics  . Smoking status: Never Smoker   . Smokeless tobacco: Never Used  . Alcohol Use: No  . Drug Use: No  . Sexual Activity: Not on file   Other Topics Concern  . Not on file   Social History Narrative   Lives with son   Caffeine use- coffee- 2 cups daily      Family History  Problem Relation Age of Onset  . Heart disease Brother      PHYSICAL EXAM: 118/70  95  sats 93% on 3L  General:  Elderly. SOB at rest. Anxious HEENT: normal Neck: supple. JVP 9. Carotids 2+ bilat; no bruits. No lymphadenopathy or thryomegaly appreciated. Cor: PMI nondisplaced.  Regular rate & rhythm. 2/6TR increased p2 Lungs: diffuse crackles  Abdomen: soft, nontender, nondistended. No hepatosplenomegaly. No bruits or masses. Good bowel sounds. Extremities: no cyanosis, clubbing, rash, edema + swan neck deformity and ulnar deviation  Neuro: alert & oriented x 3, cranial  nerves grossly intact. moves all 4 extremities w/o difficulty. Affect pleasant.   ASSESSMENT & PLAN:  1. Chronic respiratory failure in setting of ILD/pulmonary fibrosis likely 2/2 RA 2. Severe coronary calcifications on CT chest  3. DM2 4. Worsening dyspnea  She has severe ILD and suspect most of her symptoms are related to this. However also evidence of PH on echo and severe CAD on chest CT. Will proceed with R & L heart cath next week. Will also repeat hi-res chest CT. I discussed with Dr. Chase Caller.   If she does have significant PAH will have to be very careful with initiation of selection pulmonary vasodilators due to risk of shunting with severe ILD. May consider Tyvaso.   Doretha Goding,MD 4:07 PM

## 2015-10-29 NOTE — Interval H&P Note (Signed)
History and Physical Interval Note:  10/29/2015 8:00 AM  Donna Watts  has presented today for surgery, with the diagnosis of pulmonary hypertension  The various methods of treatment have been discussed with the patient and family. After consideration of risks, benefits and other options for treatment, the patient has consented to  Procedure(s): Right/Left Heart Cath and Coronary Angiography (N/A) and possible coronary angioplasty as a surgical intervention .  The patient's history has been reviewed, patient examined, no change in status, stable for surgery.  I have reviewed the patient's chart and labs.  Questions were answered to the patient's satisfaction.    Cath Lab Visit (complete for each Cath Lab visit)  Clinical Evaluation Leading to the Procedure:   ACS: No.  Non-ACS:    Anginal Classification: CCS IV  Anti-ischemic medical therapy: No Therapy  Non-Invasive Test Results: No non-invasive testing performed  Prior CABG: No previous CABG    Hawraa Stambaugh, Quillian Quince

## 2015-10-29 NOTE — Discharge Instructions (Signed)
Angiogram, Care After Refer to this sheet in the next few weeks. These instructions provide you with information about caring for yourself after your procedure. Your health care provider may also give you more specific instructions. Your treatment has been planned according to current medical practices, but problems sometimes occur. Call your health care provider if you have any problems or questions after your procedure. WHAT TO EXPECT AFTER THE PROCEDURE After your procedure, it is typical to have the following:  Bruising at the catheter insertion site that usually fades within 1-2 weeks.  Blood collecting in the tissue (hematoma) that may be painful to the touch. It should usually decrease in size and tenderness within 1-2 weeks. HOME CARE INSTRUCTIONS  Take medicines only as directed by your health care provider.  You may shower 24-48 hours after the procedure or as directed by your health care provider. Remove the bandage (dressing) and gently wash the site with plain soap and water. Pat the area dry with a clean towel. Do not rub the site, because this may cause bleeding.  Do not take baths, swim, or use a hot tub until your health care provider approves.  Check your insertion site every day for redness, swelling, or drainage.  Do not apply powder or lotion to the site.  Do not lift over 10 lb (4.5 kg) for 5 days after your procedure or as directed by your health care provider.  Ask your health care provider when it is okay to:  Return to work or school.  Resume usual physical activities or sports.  Resume sexual activity.  Do not drive home if you are discharged the same day as the procedure. Have someone else drive you.  You may drive 24 hours after the procedure unless otherwise instructed by your health care provider.  Do not operate machinery or power tools for 24 hours after the procedure or as directed by your health care provider.  If your procedure was done as an  outpatient procedure, which means that you went home the same day as your procedure, a responsible adult should be with you for the first 24 hours after you arrive home.  Keep all follow-up visits as directed by your health care provider. This is important. SEEK MEDICAL CARE IF:  You have a fever.  You have chills.  You have increased bleeding from the catheter insertion site. Hold pressure on the site. SEEK IMMEDIATE MEDICAL CARE IF:  You have unusual pain at the catheter insertion site.  You have redness, warmth, or swelling at the catheter insertion site.  You have drainage (other than a small amount of blood on the dressing) from the catheter insertion site.  The catheter insertion site is bleeding, and the bleeding does not stop after 30 minutes of holding steady pressure on the site.  The area near or just beyond the catheter insertion site becomes pale, cool, tingly, or numb.   This information is not intended to replace advice given to you by your health care provider. Make sure you discuss any questions you have with your health care provider.   Document Released: 10/31/2004 Document Revised: 05/05/2014 Document Reviewed: 09/15/2012 Elsevier Interactive Patient Education [employer].

## 2015-10-29 NOTE — Progress Notes (Signed)
5Fr. Sheath removed from right brachial vein.  Pressure held for 10 minutes.  Vital signs remained stable throughout.  Vascular site level 0.  Gauze, tegaderm and coban dressing applied.  6Fr. Sheath removed from right femoral artery.  Pressure held for 20 minutes.  Vascular site level 0.  Vital signs remained stable throughout.  Gauze and tegaderm dressing applied.  Patient has right radial vascular site, level 0, gauze and tegaderm dressing applied prior to coming to [facility].  Dressing remains clean, dry and intact.  Bedrest begins at 10:00am.  Patient transported on 6L O2 to procedural short stay and report given to [place], Therapist, sports.

## 2015-10-31 MED FILL — Verapamil HCl IV Soln 2.5 MG/ML: INTRAVENOUS | Qty: 2 | Status: AC

## 2015-11-05 ENCOUNTER — Telehealth: Payer: Self-pay | Admitting: Internal Medicine

## 2015-11-05 DIAGNOSIS — M0579 Rheumatoid arthritis with rheumatoid factor of multiple sites without organ or systems involvement: Secondary | ICD-10-CM

## 2015-11-05 DIAGNOSIS — J849 Interstitial pulmonary disease, unspecified: Secondary | ICD-10-CM

## 2015-11-05 DIAGNOSIS — J9611 Chronic respiratory failure with hypoxia: Secondary | ICD-10-CM

## 2015-11-05 NOTE — Telephone Encounter (Signed)
LMTCB

## 2015-11-05 NOTE — Telephone Encounter (Signed)
Pt son returning call and can be reached @ [phone].Hillery Hunter

## 2015-11-05 NOTE — Telephone Encounter (Signed)
lmtcb

## 2015-11-06 NOTE — Telephone Encounter (Signed)
Callback number listed below is incorrect, correct callback number is [phone]  Spoke with pt's daughter-in-law Donna Watts, states that pt had a R heart cath and coronary angiography recently, and that this was done regarding a possible study per pt's daughter in law.  Results in Epic.  They are requesting recs of this, and would like an update on the study.  Also, pt and her family would like MR's opinion on being referred to [facility] for consideration of a lung transplant.  Pt's daughter in law states that she was evaluated a few years ago for this but did not want to proceed at that time, but would like now to be reconsidered.    MR please advise.  Thanks!

## 2015-11-07 NOTE — Telephone Encounter (Signed)
Pt son returning call. pls call his cell [phone]

## 2015-11-07 NOTE — Telephone Encounter (Signed)
LMTCB

## 2015-11-07 NOTE — Telephone Encounter (Signed)
Spoke with pt's son, aware of below recs.  Pt's son states that he spoke to a referral coordinator at [facility] who told him that pt would not be excluded for her age.  Pt's son is asking MR to reconsider his decision to refer pt back to transplant center.  MR please advise.  Thanks.

## 2015-11-07 NOTE — Telephone Encounter (Signed)
lmtcb X1 for pt's son to relay below recs.

## 2015-11-07 NOTE — Telephone Encounter (Signed)
Pt son returning call again please call him on cell [phone].Donna Watts

## 2015-11-07 NOTE — Telephone Encounter (Signed)
1. DR BEnsimhon called me last week  - my recollectioin is that he was starting Rx for Pulm htn. He went over results of the cath with patient and son. I would direct family to ask him questions  2. Age 82 - she is not a candidate for transplant of lung

## 2015-11-07 NOTE — Telephone Encounter (Signed)
lmtcb for pt's son Cornelia Copa

## 2015-11-07 NOTE — Telephone Encounter (Signed)
Pt son returning call and can be reached @ [phone].Donna Watts

## 2015-11-07 NOTE — Telephone Encounter (Signed)
Donna Watts is the one who told us in meetings that upper limit is 70-74 and very rarely they do 24-82 -they do it if they think the person's body is otherwise 65. I am more than happy to make referral. Please apologize on my behalf for my lack of awarness on the latest understanding

## 2015-11-07 NOTE — Telephone Encounter (Signed)
lmomtcb for [employer]

## 2015-11-09 NOTE — Telephone Encounter (Addendum)
Called and spoke with pts son and he is aware of MR recs.  Donna Watts stated that he appreciated MR making the referral to [facility] for the pt.  MR will you please put in the referral for the pt to go to [facility]>  thanks

## 2015-11-12 NOTE — Addendum Note (Signed)
Addended by: Brand Males on: 11/12/2015 02:22 PM   Modules accepted: Orders

## 2015-11-12 NOTE — Telephone Encounter (Signed)
Order for transplatn referral done

## 2015-11-20 ENCOUNTER — Telehealth (HOSPITAL_COMMUNITY): Payer: Self-pay

## 2015-11-20 NOTE — Telephone Encounter (Signed)
Patient's son calling CHF clinic triage to ask about plan of care after recent heart cath with Dr. Haroldine Laws. Wondering if consultation has been made between Dr. Haroldine Laws and Dr. Lynford Citizen to disucss whether Tyvaso being started would be indicated or not. Will forward to provider to inquire about any updates.  Renee Pain

## 2015-11-21 NOTE — Telephone Encounter (Signed)
I talked to Dr. Chase Caller. We will start careful trial of Tyvaso. 3-6 puffs 4x/daily  Ellyson Rarick,MD 4:40 PM

## 2015-11-21 NOTE — Telephone Encounter (Signed)
Donna Watts can you please arrange, thanks

## 2015-11-22 ENCOUNTER — Telehealth (HOSPITAL_COMMUNITY): Payer: Self-pay | Admitting: *Deleted

## 2015-11-22 NOTE — Telephone Encounter (Signed)
Abigail Butts with [facility] called to tell us, that Tyvaso isn't covered under Hospice plan of care. She said that if she wanted to take it she has to come out of hospice and pay out of pocket. She said that she would call and let the son know this also.  Will forward message to DB and pulm. MD.

## 2015-11-22 NOTE — Telephone Encounter (Signed)
This would be correct based on my understanding of hospice rules  Thanks  Dr. Brand Males, M.D., [facility].C.P Pulmonary and Critical Care Medicine Staff Physician [place] Pulmonary and Critical Care Pager: [phone], If no answer or between  15:00h - 7:00h: call [phone]  11/22/2015 3:19 PM

## 2015-11-23 ENCOUNTER — Telehealth: Payer: Self-pay | Admitting: Internal Medicine

## 2015-11-23 NOTE — Telephone Encounter (Signed)
Spoke with pt's son Donna Watts, aware of [facility] recs. Donna Watts wants to know if pt is a candidate for Ofev or Esbriet.    MR please advise.  Thanks.

## 2015-11-23 NOTE — Telephone Encounter (Signed)
Let Donna Watts know that got letter from [facility] dated 11/13/15 - she is not a lung transplant candidate   Dr. Brand Males, M.D., [facility].C.P Pulmonary and Critical Care Medicine Staff Physician [place] Pulmonary and Critical Care Pager: [phone], If no answer or between  15:00h - 7:00h: call [phone]  11/23/2015 4:49 PM

## 2015-11-23 NOTE — Telephone Encounter (Signed)
Give her appt 11/30/15 to discuss.   Dr. Brand Males, M.D., [facility].C.P Pulmonary and Critical Care Medicine Staff Physician [place] [facility]monary and Critical Care Pager: [phone], If no answer or between  15:00h - 7:00h: call [phone]  11/23/2015 5:17 PM

## 2015-11-23 NOTE — Telephone Encounter (Signed)
Spoke with patient's son;he states he will need to speak with patient to see about what time of day will work best for appt on 11/30/15. Son is aware to call the office first thing Monday morning to set up appt. Hold in triage.

## 2015-11-25 NOTE — Telephone Encounter (Signed)
That is correct. I dont think it will help enough to come out of hospice. So would cancel   thanks

## 2015-11-26 ENCOUNTER — Telehealth: Payer: Self-pay | Admitting: Internal Medicine

## 2015-11-26 NOTE — Telephone Encounter (Signed)
Spoke with Abigail Butts, hospice RN  She states calling to let MR know that if we prescribe a med that Hospice can not afford, then she will have to be d/c'ed  Cards wanted her on Tyvaso, but Hospice does not pay for this  Will forward to MR as [facility]

## 2015-11-26 NOTE — Telephone Encounter (Signed)
Patient's son called, scheduled appt for pt on 08/04 at 1:30 pm, nothing further needed.

## 2015-11-30 ENCOUNTER — Ambulatory Visit (INDEPENDENT_AMBULATORY_CARE_PROVIDER_SITE_OTHER): Payer: Medicare Other | Admitting: Internal Medicine

## 2015-11-30 ENCOUNTER — Telehealth: Payer: Self-pay | Admitting: Internal Medicine

## 2015-11-30 ENCOUNTER — Encounter: Payer: Self-pay | Admitting: Internal Medicine

## 2015-11-30 VITALS — BP 142/62 | HR 85 | Ht <= 58 in | Wt 97.2 lb

## 2015-11-30 DIAGNOSIS — J9611 Chronic respiratory failure with hypoxia: Secondary | ICD-10-CM | POA: Diagnosis not present

## 2015-11-30 DIAGNOSIS — I272 Other secondary pulmonary hypertension: Secondary | ICD-10-CM

## 2015-11-30 DIAGNOSIS — H6122 Impacted cerumen, left ear: Secondary | ICD-10-CM

## 2015-11-30 DIAGNOSIS — M0579 Rheumatoid arthritis with rheumatoid factor of multiple sites without organ or systems involvement: Secondary | ICD-10-CM

## 2015-11-30 DIAGNOSIS — J849 Interstitial pulmonary disease, unspecified: Secondary | ICD-10-CM | POA: Diagnosis not present

## 2015-11-30 DIAGNOSIS — H6120 Impacted cerumen, unspecified ear: Secondary | ICD-10-CM | POA: Insufficient documentation

## 2015-11-30 NOTE — Telephone Encounter (Signed)
Donna Watts  She is coming off hospice. Can you send order again to starty tyvaso please. They want tyvaso. THey ordered hospice through their sister in law wendy for needing a aide but now they understand the inrticiancies of hospice she is comin off hospic eand will want to start tyvaso trial  Thanks  Dr. Brand Males, M.D., [facility].C.P Pulmonary and Critical Care Medicine Staff Physician [place] Pulmonary and Critical Care Pager: [phone], If no answer or between  15:00h - 7:00h: call [phone]  11/30/2015 2:14 PM

## 2015-11-30 NOTE — Telephone Encounter (Signed)
Called and lmom for wendy to let her know that MR is seeing pt today at 130 and he will discuss this with her at that time.

## 2015-11-30 NOTE — Progress Notes (Addendum)
Subjective:     Patient ID: Donna Watts, female   DOB: [birth year], 82 y.o.   MRN: [id number]  HPI   OV 11/30/2015  Chief Complaint  Patient presents with  . Follow-up    Pt here wanting to discuss IPF medication. Pt states her breathing has worsened since last OV in May 2017. Pt c/o cough that worsens at night. Pt denies CP/tightness.     PCP FULP, CAMMIE, MD Rheum: Dr Gavin Pound  HPI  #RA-ILD/UIP pattern  -  h/o cough onset around 2005 with a cxr showing scarring per Dr Inda Merlin office records c/w PF -  - Diagnosed RA with arthritis and ? EN summer 2014  - First CT chest June 2010: R> L UIP pattern  -Followup CT chest June 2014 - R > L UIP pattern much worse   - - PFT's November 09, 2008 VC 2.03 ( 92%) DLC0 52%  - PFT's October 25, 2009 VC 2.0 (92%)   DLC0 58%  -PFT's  09/25/10            VC1.88 (86%)  DLCO 54 - Ex desat documented 11/09/08 x 1 lap > 84% p 2 laps October 25, 2009 > 84% p one lap 08/27/2010     > 87 p 2 laps RA 09/25/2010 > 83% p 2 laps at a very rapid pace 12/02/10 rec pace or wear 02, wants to try pacing - 07/04/2011   Walked RA x one lap @ 185 stopped due to  sats 87% at nl pace > 2lpm - 08/29/2011   Walked RA x one lap @ 185 stopped due to  desat to 88, improved but not eliminated on 2lpm so rec use 3lpm with ex - - PFT May 2014: FVC 1.3L/67%, DLCO 6.4/36%   - > started on CellCept in March 2015. [name] May 2015: FVC of 1.34/71% - PFT August 2015: VC 1.3. The/69%. FEV1 1.2 L/87%. Ratio of 92/125%. DLCO of 6.4/36%.    - Rx   - PPI since 2010  - Immuran/Pred since 2014   - O2 dependent x ? 2012     - Jan 2015: 3L at rest, 5L with exertion, 8L uphill   - started on CellCept in March 2015.    .................................................. OV 09/12/2013  Chief Complaint  Patient presents with  . Follow-up    Pt states her breathing has improved. C/o DOE, intermittent productive cough with white mucous. Denies CP.    Followup interstitial lung disease UIP pattern  secondary to rheumatoid arthritis  Since last visit and her 2015 she has seen Dr. Gavin Pound of rheumatology. She was started on CellCept in March 2015. According to her and her son the higher doses causes side effects. Therefore she is back down 500 milligrams twice a day. She says she is tolerating this well. She is attending pulmonary rehabilitation. Overall she's feeling much better than her previous baseline with reduced dyspnea, increased quality of life and increase physical conditioning. Apparently pulmonary rehabilitation helps her a lot. She was supposed to have pulmonary function test today but she did not fall unclear reasons perhaps due to scheduling. She continues to use oxygen 3 L at night at rest. At daytime she uses 6 L. And even with a 6 L when she overexerts herself evidence of the house she is transient desaturation to the 70s but this is baseline  SPirometry in office today shows FVC of 1.34/71%. FEV1 was 1.06 L or 76%. Ratio 79/104%. Stable since May 2014  OV 12/22/2013  Chief Complaint  Patient presents with  . Follow-up    Pt with PFT results. Pt states her breathing is unchanged since last OV. Pt c/o DOE. Pt denies cough and Cp/tightness.    Followup interstitial lung disease pulmonary fibrosis UIP pattern secondary to rheumatoid arthritis  She is here with her son Gene. She reports overall stability in the sense of well-being. She takes CellCept 1051m in day, 5011mat night (son verified this the following day). She did not bring a medication list with her today. She continues to attend pulmonary rehabilitation. She feels that this helps the with her dyspnea, quality of life and physical conditioning. Son notices that now at rest that she even manages well with 1 L oxygen and they think that she can get by with 2 L of oxygen at rest at night. In the daytime and apparently at rehabilitation with 6 L she does not desaturate. Pulmonary function test that shows continued  stability of her lung function since May of 2014 and especially since starting CellCept in the spring of this year 2015. At this point in time she does not have any problems tolerating the CellCept   Pulmonary function test August 27 today shows FVC 1.3. The/69%. FEV1 1.2 L/87%. Ratio of 92/125%. DLCO of 6.4/36%. The numbers are stable since May 2014.     Acute OV ILD /RA on cellcept/steroids  Presents with son for an acute office visit.  Complains of 2 weeks of increased DOE. O2 sats running low at pulm rehab.  Complains of  nasal congestion, increased SOB, wheezing and chest tightnes, and productive cough with white mucus. Denies f/c/s, n/v, hemoptysis, edema .  More winded than her usual. Wears out easily No chest pain,hemoptysis  orthopnea, edema or fever.  No otc used.  Remains on pred 67m65maily   12/06/2014 Acute OV  Presents for an acute office visit.  Complains of increased cough, congestion , with thick mucus , and more dypsnea/DOE.  Worse over last couple of months.  Denies any chest congestion/tightness, wheezing.  On oxygen 3-4 L O2 at rest and 8 lm with act.    Remains on Symbicort Twice daily  .  Remains on pred 67mg93mily  On cellcept. Twice daily  .  Follows with Rheumatology for RA with routine labs.  PVX is utd .  Discussed getting prevnar once she is well again.    OV 04/13/2015  Chief Complaint  Patient presents with  . Follow-up    Pt states her breathing is unchanged since last Ov. Pt states she has an increase in cough at night. Pt denies CP/tightness and f/c/s.    Follow-up pulmonary fibrosis secondary to rheumatoid arthritis. Maintain on oxygen, CellCept and prednisone. She gets a CellCept and prednisone through Dr. AngeGavin Pounde now follows with Dr. AngeGavin Poundthe new practice [facility]. Overall she's doing well. Dyspnea stable. Son reports she has cough that is worse at night this is also stable. Its of moderate  intensity. It is associated with chronic ear blockage symptoms. Apparently they're considering a ENT evaluation. She uses  To 4 L at rest and more at night. Son sometimes increases the oxygen when she is sleeping because of the cough but is not sure if this is helping. She does not use nasal steroids as she is supposed to. She used it in pulmonary rehabilitation a few years ago. Son feels she is more deconditioned now and is wondering if going  back that would benefit her    08/28/2015 Follow up office Visit:  Patient returns for a 3 month follow up.Son has notices some shortness of breath with exertion. She has not been doing the physical therapy, but is trying to exercise at home and walk with her son..She does monitor her oxygen sats at home. She is wearing her home oxygen at 7L while at home and 5L when sleeping. She does increase to 8 L with exertion.She knows goal is 90%.She does have some secretions at night, white in color.No fever or chest pain, no orthopnea, hemoptysis.She is concerned about the fact that her diffusion capacity has decreased since her last PFT's, and had questions about what her options are as her oxygen demands increase.    OV 11/30/2015  Chief Complaint  Patient presents with  . Follow-up    Pt here wanting to discuss IPF medication. Pt states her breathing has worsened since last OV in May 2017. Pt c/o cough that worsens at night. Pt denies CP/tightness.     Presents with son [name]. Last visit was in May 2017. At that time there was decline in chronic hypoxemic resp failuire and woprsening dlco on PFT (though FVC unchanged). So CT chest 10/26/15 showed some progression in ILD since 2014. ECHO suggested elevated PASP. Then had righ heart cath 10/29/15 with elevated PAP to mean 65mgHg. Dr BJeffie Pollockand I discusssed; Tyvaso Rx wa sinitiated. Her sons wanted to see if lng transplant was an optino but [facility] rejected referral based on age alone. Then suddenly we got a call notifiying  she was now home hospice and still she was interested in tyvaso. So asked her to come in 11/30/2015. So ACristie Hemsays that his brother Gene's wife WAbigail Buttsis a [employer] nurse and recommended hospice as a way to get home help/aide. So they got hospice through PCP but were disappointed that she cannot be on tyvaso and hospice and confused as to why.  However, patient and son still interested in Tyvaso. Also, son wondering if esbriet/ofev - antifibrotic Rx for IPF are options. I discussed th efollowing about hospice  Discussed medicare hospice benefit  - a medicare paid benefit  - for people with terminal qualifying  illness such as IPF, COPD, cancer with statistical prognosis < 6 months for which there is no cure - utilization of hospice shows people live longer paradoxically than those without hospice due to improved attention  - explained hospice locations - home, residential etc.,   - explained respite care options for caregivers  - explained that she could still get treatment for non-hospice diagnosis and still come to office to see me for hospice related diagnosis that i provide support for  - explained that hospice provides nursing, MD, chaplain, volunteer and medications and supplies paid through medicare   Based on above they are not interested in hospice at this point but can be in future though they know she qualifies for hospice. They now understand that they cannot just get hospice for the end goal of just an aide at home. They want to try Tyvaso. They are ok with home palliative care - I explained this and differentiated this from hospice. Patient new issue is sudden decrease in left side hearing  Curent symptoms   K-BILD ILD QUESTIONNAIRE, Symptom score over prior 2 weeks  7-none, 6-rarely, 5-occ, 5-some times, 3-sev times, 2-most times, 1-every time 11/30/2015 Pre-tyvaso  Dyspnea for stairs, incline or hill 2  Chest Tightness 4  Worry about seriousness  of lung complaint 2  Avoided doing things  that make you dyspneic 2  Have you felt loss of control of lung condition (reversed from original) 2  Felt fed up due to lung condition 3  Felt urge to breathe aka air hunger 4  Has lung condition made you feel anxious 3  How often have you experienced wheezing or whistling sound 6  How much of the time have you felt your lung dz is getting worse 3  How much has your lung condition interfered with job or daily task 2  Were you expecting your lung condition to get worse 3  How much has your lung function limited you carrying things like groceris 2  How much has your lung function made you think of EOL? 3  Total 41 (avg 2.9)  Are you financially worse off 5  Grand Total 46      has a past medical history of Acid reflux; Cancer ([place]); Diabetes mellitus without complication ([place]); Esophageal reflux; Other and unspecified hyperlipidemia; Pulmonary fibrosis ([place]); and Rheumatoid arthritis ([place]).   reports that she has never smoked. She has never used smokeless tobacco.  Past Surgical History:  Procedure Laterality Date  . APPENDECTOMY  1960's    Allergies  Allergen Reactions  . Tramadol Rash and Hypertension    Dizziness     Immunization History  Administered Date(s) Administered  . Influenza Split 01/26/2013, 01/26/2014  . Influenza,inj,Quad PF,36+ Mos 02/05/2015  . Pneumococcal Conjugate-13 04/13/2015  . Pneumococcal Polysaccharide-23 04/26/2011    Family History  Problem Relation Age of Onset  . Heart disease Brother      Current Outpatient Prescriptions:  .  albuterol (PROVENTIL HFA;VENTOLIN HFA) 108 (90 Base) MCG/ACT inhaler, Inhale 2 puffs into the lungs every 6 (six) hours as needed for wheezing or shortness of breath., Disp: , Rfl:  .  ALPRAZolam (XANAX) 0.25 MG tablet, Take 0.25 mg by mouth as needed for anxiety. , Disp: , Rfl:  .  aspirin EC 81 MG tablet, Take 81 mg by mouth every morning. , Disp: , Rfl:  .  budesonide-formoterol (SYMBICORT) 160-4.5 MCG/ACT  inhaler, Inhale 2 puffs into the lungs 2 (two) times daily., Disp: , Rfl:  .  Cholecalciferol (VITAMIN D) 2000 UNITS CAPS, Take 2,000 Units by mouth daily. , Disp: , Rfl:  .  clonazePAM (KLONOPIN) 0.5 MG tablet, Take 0.5 mg by mouth at bedtime as needed for anxiety. , Disp: , Rfl:  .  dextromethorphan-guaiFENesin (MUCINEX DM) 30-600 MG per 12 hr tablet, Take 1 tablet by mouth every 12 (twelve) hours as needed for cough. , Disp: , Rfl:  .  ferrous sulfate 325 (65 FE) MG tablet, Take 325 mg by mouth daily with breakfast., Disp: , Rfl:  .  fluticasone (FLONASE) 50 MCG/ACT nasal spray, Place 2 sprays into both nostrils daily as needed for allergies or rhinitis., Disp: , Rfl:  .  ibuprofen (ADVIL,MOTRIN) 200 MG tablet, Take 600 mg by mouth every 6 (six) hours as needed (For pain.). Reported on 10/25/2015, Disp: , Rfl:  .  metFORMIN (GLUCOPHAGE-XR) 500 MG 24 hr tablet, Take 1,000 mg by mouth daily with supper. , Disp: , Rfl:  .  Multiple Vitamins-Minerals (CENTRUM SILVER PO), Take 1 tablet by mouth daily.  , Disp: , Rfl:  .  mycophenolate (CELLCEPT) 500 MG tablet, Take 500 mg by mouth 2 (two) times daily. , Disp: , Rfl:  .  omeprazole (PRILOSEC) 20 MG capsule, Take 20 mg by mouth daily as needed (  For heartburn or acid reflux.). , Disp: , Rfl:  .  PARoxetine (PAXIL) 10 MG tablet, Take 10 mg by mouth daily as needed (For depression.). , Disp: , Rfl:  .  predniSONE (DELTASONE) 1 MG tablet, Take 3 mg by mouth daily with breakfast. , Disp: , Rfl:  .  predniSONE (DELTASONE) 5 MG tablet, Take 5 mg by mouth at bedtime. , Disp: , Rfl:  .  simvastatin (ZOCOR) 20 MG tablet, Take 20 mg by mouth at bedtime.  , Disp: , Rfl:    Review of Systems     Objective:   Physical Exam Vitals:   11/30/15 1340  BP: (!) 142/62  Pulse: 85  SpO2: 93%  Weight: 97 lb 3.2 oz (44.1 kg)  Height: _0  (1.422 m)   Same frail female RA joints in hands On o2 - slightly more dyspneic talking thatn baseline Crackles on chest      Assessment:       ICD-9-CM ICD-10-CM   1. ILD (interstitial lung disease) ([place]) 515 J84.9 Ambulatory referral to Hospice  2. Rheumatoid arthritis involving multiple sites with positive rheumatoid factor (HCC) 714.0 M05.79   3. Chronic respiratory failure with hypoxia (HCC) 518.83 J96.11 Ambulatory referral to Hospice   799.02    4. Pulmonary hypertension (HCC) 416.8 I27.2 Ambulatory referral to Hospice  5. Excess ear wax, left 380.4 H61.22        Plan:      ILD (interstitial lung disease) (HCC) Rheumatoid arthritis involving multiple sites with positive rheumatoid factor (HCC) Chronic respiratory failure with hypoxia (HCC)  - cotninue o2 and cellcept - no role for OFev and Esbriet in this type of fibrosis unless further research is done - hold off on hospice due to goal of starting [place] treatment thiough you qualify for hospice on medical grounds - But will order home palliative care through [facility] for extrra layer of support   Pulmonary hypertension ([place]) - start tyvaso through Dr Jeffie Pollock; I have contacted him   Excess ear wax, left - call your PCP GATES,ROBERT NEVILL, MD  Followup   2 months or sooner if needed      > 50% of this > 25 min visit spent in face to face counseling or coordination of care    Dr. Brand Males, M.D., [facility].C.P Pulmonary and Critical Care Medicine Staff Physician [place] Pulmonary and Critical Care Pager: [phone], If no answer or between  15:00h - 7:00h: call [phone]  11/30/2015 6:38 PM

## 2015-11-30 NOTE — Telephone Encounter (Signed)
Dr Denita Lung got back and he feels not worth coming out of hospice for tyvaso but I think I am seeing her 11/30/2015 and can discuss  .

## 2015-11-30 NOTE — Patient Instructions (Addendum)
ICD-9-CM ICD-10-CM   1. ILD (interstitial lung disease) ([place]) 515 J84.9   2. Rheumatoid arthritis involving multiple sites with positive rheumatoid factor (HCC) 714.0 M05.79   3. Chronic respiratory failure with hypoxia (HCC) 518.83 J96.11    799.02    4. Pulmonary hypertension (HCC) 416.8 I27.2   5. Excess ear wax, left 380.4 H61.22    ILD (interstitial lung disease) (HCC) Rheumatoid arthritis involving multiple sites with positive rheumatoid factor (HCC) Chronic respiratory failure with hypoxia (HCC)  - cotninue o2 and cellcept - no role for OFev and Esbriet in this type of fibrosis unless further research is done - hold off on hospice due to below [place] treatment - But will order home palliative care through [facility] for extrra layer of support   Pulmonary hypertension ([place]) - start tyvaso through Dr Jeffie Pollock; I have contacted him   Excess ear wax, left - call your PCP GATES,ROBERT NEVILL, MD  Followup   2 months or sooner if needed

## 2015-12-01 ENCOUNTER — Emergency Department (HOSPITAL_COMMUNITY)

## 2015-12-01 ENCOUNTER — Emergency Department (HOSPITAL_COMMUNITY)
Admission: EM | Admit: 2015-12-01 | Discharge: 2015-12-01 | Disposition: A | Discharge status: Home / Self Care | Attending: Emergency Medicine | Admitting: Emergency Medicine

## 2015-12-01 ENCOUNTER — Encounter (HOSPITAL_COMMUNITY): Payer: Self-pay | Admitting: Emergency Medicine

## 2015-12-01 DIAGNOSIS — J9620 Acute and chronic respiratory failure, unspecified whether with hypoxia or hypercapnia: Secondary | ICD-10-CM

## 2015-12-01 DIAGNOSIS — R079 Chest pain, unspecified: Secondary | ICD-10-CM | POA: Diagnosis not present

## 2015-12-01 DIAGNOSIS — Z79899 Other long term (current) drug therapy: Secondary | ICD-10-CM | POA: Diagnosis not present

## 2015-12-01 DIAGNOSIS — K5730 Diverticulosis of large intestine without perforation or abscess without bleeding: Secondary | ICD-10-CM | POA: Insufficient documentation

## 2015-12-01 DIAGNOSIS — R0600 Dyspnea, unspecified: Secondary | ICD-10-CM

## 2015-12-01 DIAGNOSIS — E119 Type 2 diabetes mellitus without complications: Secondary | ICD-10-CM | POA: Diagnosis not present

## 2015-12-01 DIAGNOSIS — Z8709 Personal history of other diseases of the respiratory system: Secondary | ICD-10-CM

## 2015-12-01 DIAGNOSIS — Z7982 Long term (current) use of aspirin: Secondary | ICD-10-CM | POA: Diagnosis not present

## 2015-12-01 DIAGNOSIS — R0902 Hypoxemia: Secondary | ICD-10-CM

## 2015-12-01 DIAGNOSIS — Z85840 Personal history of malignant neoplasm of eye: Secondary | ICD-10-CM | POA: Diagnosis not present

## 2015-12-01 DIAGNOSIS — Z7984 Long term (current) use of oral hypoglycemic drugs: Secondary | ICD-10-CM | POA: Insufficient documentation

## 2015-12-01 DIAGNOSIS — R0602 Shortness of breath: Secondary | ICD-10-CM | POA: Diagnosis present

## 2015-12-01 DIAGNOSIS — Z85828 Personal history of other malignant neoplasm of skin: Secondary | ICD-10-CM | POA: Diagnosis not present

## 2015-12-01 LAB — BASIC METABOLIC PANEL
ANION GAP: 8 (ref 5–15)
BUN: 13 mg/dL (ref 6–20)
CHLORIDE: 91 mmol/L — AB (ref 101–111)
CO2: 34 mmol/L — AB (ref 22–32)
Calcium: 9.5 mg/dL (ref 8.9–10.3)
Creatinine, Ser: 0.45 mg/dL (ref 0.44–1.00)
GFR calc non Af Amer: >60 mL/min (ref >60)
GLUCOSE: 156 mg/dL — AB (ref 65–99)
POTASSIUM: 5.3 mmol/L — AB (ref 3.5–5.1)
Sodium: 133 mmol/L — ABNORMAL LOW (ref 135–145)

## 2015-12-01 LAB — CBC
HEMATOCRIT: 39.8 % (ref 36.0–46.0)
HEMOGLOBIN: 12.2 g/dL (ref 12.0–15.0)
MCH: 27.8 pg (ref 26.0–34.0)
MCHC: 30.7 g/dL (ref 30.0–36.0)
MCV: 90.7 fL (ref 78.0–100.0)
Platelets: 285 10*3/uL (ref 150–400)
RBC: 4.39 MIL/uL (ref 3.87–5.11)
RDW: 13.8 % (ref 11.5–15.5)
WBC: 11.3 10*3/uL — ABNORMAL HIGH (ref 4.0–10.5)

## 2015-12-01 LAB — HEPATIC FUNCTION PANEL
ALBUMIN: 3.9 g/dL (ref 3.5–5.0)
ALK PHOS: 55 U/L (ref 38–126)
ALT: 21 U/L (ref 14–54)
AST: 27 U/L (ref 15–41)
Bilirubin, Direct: <0.1 mg/dL — ABNORMAL LOW (ref 0.1–0.5)
TOTAL PROTEIN: 8.4 g/dL — AB (ref 6.5–8.1)
Total Bilirubin: 0.4 mg/dL (ref 0.3–1.2)

## 2015-12-01 LAB — LIPASE, BLOOD: Lipase: 37 U/L (ref 11–51)

## 2015-12-01 LAB — I-STAT TROPONIN, ED: TROPONIN I, POC: 0.02 ng/mL (ref 0.00–0.08)

## 2015-12-01 MED ORDER — LORAZEPAM 2 MG/ML IJ SOLN
1.0000 mg | Freq: Once | INTRAMUSCULAR | Status: AC
Start: 2015-12-01 — End: 2015-12-01
  Administered 2015-12-01: 1 mg via INTRAVENOUS
  Filled 2015-12-01: qty 1

## 2015-12-01 MED ORDER — IOPAMIDOL (ISOVUE-300) INJECTION 61%
INTRAVENOUS | Status: AC
  Administered 2015-12-01: 75 mL
  Filled 2015-12-01: qty 75

## 2015-12-01 NOTE — ED Triage Notes (Signed)
Pt c/o shortness of breath and chest pain that started 2 days ago-- pt on home O2-- states she wears it at 10L/[place] when up and around at home. On 6L/M/[place] at present-=- pt's sats are 94% at rest, when she startes talking , sats drop to 84-85%.

## 2015-12-01 NOTE — Consult Note (Signed)
Name: Donna Watts MRN: [id number] DOB: July 10, 1929    ADMISSION DATE:  12/01/2015 CONSULTATION DATE:  12/01/15  REFERRING MD :  EDP  CHIEF COMPLAINT:  Epigastric pain  BRIEF PATIENT DESCRIPTION: 82F w/ hx of RA-ILD/UIP pattern on 10L at rest, 15L with exertion who presents with complaints of discomfort in the epigastric region that is exacerbated by movement and coughing.  SIGNIFICANT EVENTS   STUDIES:  CTA chest -    HISTORY OF PRESENT ILLNESS:   Donna Watts is an 82F with history of long standing chronic hypoxemic respiratory failure secondary to underlying restrictive lung disease, specifically RA-ILD with UIP pattern on 10L at rest and 15L with exertion, mild pulmonary hypertension with mean PA pressure 36, CAD without anginal symptoms, DMII. Her rheumatoid arthritis is quiet and currently treated with CellCept 528m BID and 843mprednisone daily. Her lung disease has been largely stable. She reports using 10L O2 at home and 15L with exertion, but actual prescription is unclear from last office note.  She denies fever, chills, nausea, vomiting, diarrhea, constipation, bloody or tarry stools, urinary issues, joint complaints (swelling / warmth / erythema), no chest pain per se, no change in baseline shortness of breath, cough or sputum production. No hemoptysis. No reflux, per se. The pain is dull and fairly constant and located just under her sternum in the epigastric region. It is a little tender when she pushes on it and it does not radiate. She says it is aggravated by standing up and coughing.   PAST MEDICAL HISTORY :   has a past medical history of Acid reflux; [place] ([facility] Diabetes mellitus without complication ([place] Esophageal reflux; Other and unspecified hyperlipidemia; Pulmonary fibrosis ([place] and Rheumatoid arthritis ([facility]  has a past surgical history that includes Appendectomy (1960's). Prior to Admission medications   Medication Sig Start Date End Date Taking? Authorizing  Provider  albuterol (PROVENTIL HFA;VENTOLIN HFA) 108 (90 Base) MCG/ACT inhaler Inhale 2 puffs into the lungs every 6 (six) hours as needed for wheezing or shortness of breath.   Yes Historical Provider, MD  ALPRAZolam (XDuanne Moron0.25 MG tablet Take 0.25 mg by mouth as needed for anxiety.  08/27/15  Yes Historical Provider, MD  aspirin EC 81 MG tablet Take 81 mg by mouth every morning.    Yes Historical Provider, MD  budesonide-formoterol (SYMBICORT) 160-4.5 MCG/ACT inhaler Inhale 2 puffs into the lungs at bedtime as needed (for coughing).    Yes Historical Provider, MD  Cholecalciferol (VITAMIN D) 2000 UNITS CAPS Take 2,000 Units by mouth daily.    Yes Historical Provider, MD  clonazePAM (KLONOPIN) 0.5 MG tablet Take 0.5 mg by mouth at bedtime as needed for anxiety.  09/18/15  Yes Historical Provider, MD  ferrous sulfate 325 (65 FE) MG tablet Take 325 mg by mouth daily with breakfast.   Yes Historical Provider, MD  fluticasone (FLONASE) 50 MCG/ACT nasal spray Place 2 sprays into both nostrils daily as needed for allergies or rhinitis.   Yes Historical Provider, MD  guaifenesin (ROBITUSSIN) 100 MG/5ML syrup Take 200 mg by mouth 3 (three) times daily as needed for cough.   Yes Historical Provider, MD  ibuprofen (ADVIL,MOTRIN) 200 MG tablet Take 600 mg by mouth every 6 (six) hours as needed (For pain.). Reported on 10/25/2015   Yes Historical Provider, MD  metFORMIN (GLUCOPHAGE-XR) 500 MG 24 hr tablet Take 500 mg by mouth 2 (two) times daily.    Yes Historical Provider, MD  Multiple Vitamins-Minerals (CENTRUM SILVER PO) Take 1 tablet  by mouth daily.     Yes Historical Provider, MD  mycophenolate (CELLCEPT) 500 MG tablet Take 500 mg by mouth 2 (two) times daily.    Yes Historical Provider, MD  omeprazole (PRILOSEC) 20 MG capsule Take 20 mg by mouth daily as needed (For heartburn or acid reflux.).    Yes Historical Provider, MD  PARoxetine (PAXIL) 10 MG tablet Take 10 mg by mouth every morning.  09/18/15  Yes  Historical Provider, MD  predniSONE (DELTASONE) 1 MG tablet Take 3 mg by mouth daily with breakfast.  10/17/14  Yes Historical Provider, MD  predniSONE (DELTASONE) 5 MG tablet Take 5 mg by mouth at bedtime.    Yes Historical Provider, MD  simvastatin (ZOCOR) 20 MG tablet Take 20 mg by mouth every morning.    Yes Historical Provider, MD   Allergies  Allergen Reactions  . Tramadol Rash and Hypertension    Dizziness     FAMILY HISTORY:  family history includes Heart disease in her brother. SOCIAL HISTORY:  reports that she has never smoked. She has never used smokeless tobacco. She reports that she does not drink alcohol or use drugs.  REVIEW OF SYSTEMS:   Constitutional: Negative for fever, chills, weight loss, malaise/fatigue and diaphoresis.  HENT: Negative for hearing loss, ear pain, nosebleeds, congestion, sore throat, neck pain, tinnitus and ear discharge.   Eyes: Negative for blurred vision, double vision, photophobia, pain, discharge and redness.  Respiratory: see HPI Cardiovascular: see HPI Gastrointestinal: see HPI Genitourinary: Negative for dysuria, urgency, frequency, hematuria and flank pain.  Musculoskeletal: Negative for myalgias, back pain, joint pain and falls.  Skin: Negative for itching and rash.  Neurological: Negative for dizziness, tingling, tremors, sensory change, speech change, focal weakness, seizures, loss of consciousness, weakness and headaches.  Endo/Heme/Allergies: Hx diabetes. Negative for environmental allergies and polydipsia. Does not bruise/bleed easily.   SUBJECTIVE:   VITAL SIGNS: Temp:  [99.6 F (37.6 C)] 99.6 F (37.6 C) (08/05 1543) Pulse Rate:  [80-107] 88 (08/05 1930) Resp:  [25-36] 31 (08/05 1930) BP: (107-182)/(58-87) 114/62 (08/05 1930) SpO2:  [94 %-100 %] 98 % (08/05 1930) Weight:  [44 kg (97 lb)] 44 kg (97 lb) (08/05 1543)  PHYSICAL EXAMINATION: Physical Exam: Temp:  [99.6 F (37.6 C)] 99.6 F (37.6 C) (08/05 1543) Pulse  Rate:  [80-107] 88 (08/05 1930) Resp:  [25-36] 31 (08/05 1930) BP: (107-182)/(58-87) 114/62 (08/05 1930) SpO2:  [94 %-100 %] 98 % (08/05 1930) Weight:  [44 kg (97 lb)] 44 kg (97 lb) (08/05 1543)  General Well nourished, well developed, elderly thin female, no apparent distress  HEENT No gross abnormalities. Oropharynx clear. Mallampati III. Good dentition.   Pulmonary Dry rales throughout. Breath sounds L > R. No wheezes or ronchi. Symmetrical expansion. Good effort.   Cardiovascular Normal rate, regular rhythm. S1, s2. No m/r/g. Distal pulses palpable.  Abdomen Soft, non-distended, positive bowel sounds, mildly tender to palpation of the epigastric region. No rebound or guarding. No palpable organomegaly or masses. Normoresonant to percussion.  Musculoskeletal Changes of RA in hands. Otherwise grossly normal.  Lymphatics No cervical, supraclavicular or axillary adenopathy.   Neurologic Grossly intact. No focal deficits.   Skin/Integuement No rash, no cyanosis, no clubbing. Trace L pedal edema.      Recent Labs Lab 12/01/15 1539  NA 133*  K 5.3*  CL 91*  CO2 34*  BUN 13  CREATININE 0.45  GLUCOSE 156*    Recent Labs Lab 12/01/15 1539  HGB 12.2  HCT 39.8  WBC 11.3*  PLT 285   Ct Abdomen Pelvis W Contrast  Result Date: 12/01/2015 CLINICAL DATA:  82 year old female with respiratory failure. History of interstitial lung disease. Epigastric abdominal pain. EXAM: CT CHEST WITHOUT CONTRAST, AND CT ABDOMEN AND PELVIS WITH CONTRAST TECHNIQUE: Multidetector CT imaging of the chest was performed following the standard protocol without intravenous contrast. High resolution imaging of the lungs, as well as inspiratory and expiratory imaging, was performed. Multidetector CT imaging of the abdomen and pelvis was performed following the standard protocol during bolus administration of intravenous contrast. CONTRAST:  30m ISOVUE-300 IOPAMIDOL (ISOVUE-300) INJECTION 61% COMPARISON:   High-resolution chest CT 10/26/2015. CT the abdomen and pelvis 04/16/2014. FINDINGS: CT CHEST FINDINGS Cardiovascular: Heart size is normal. There is no significant pericardial fluid, thickening or pericardial calcification. There is aortic atherosclerosis, as well as atherosclerosis of the great vessels of the mediastinum and the coronary arteries, including calcified atherosclerotic plaque in the left main, left anterior descending, left circumflex and coronary arteries. Calcifications of the aortic valve. Mediastinum/Nodes: No pathologically enlarged mediastinal or hilar lymph nodes. Multiple densely calcified right hilar and mediastinal lymph nodes are incidentally noted. Esophagus is unremarkable in appearance. No axillary lymphadenopathy. Lungs/Pleura: Again noted is extensive subpleural reticulation, parenchymal banding, traction bronchiectasis and frank honeycombing. Findings are essentially unchanged compared to prior study from 10/09/2015, and are again considered diagnostic of usual interstitial pneumonia (UIP) from an imaging perspective. No acute consolidative airspace disease. No pleural effusions. Musculoskeletal: There are no aggressive appearing lytic or blastic lesions noted in the visualized portions of the skeleton. CT ABDOMEN AND PELVIS FINDINGS Hepatobiliary: Sub cm low-attenuation lesion in segment 4A of the liver is too small to definitively characterize, but is statistically likely a tiny cyst. No other suspicious hepatic lesions are noted. No intra or extrahepatic biliary ductal dilatation. Gallbladder is normal in appearance. Pancreas: No pancreatic mass. No pancreatic ductal dilatation. No pancreatic or peripancreatic fluid or inflammatory changes. Spleen: Unremarkable. Adrenals/Urinary Tract: A 2.1 cm simple cyst in the interpolar region of the right kidney. Sub cm low-attenuation lesion in the interpolar region of the left kidney is too small to characterize, but is statistically likely  a tiny cyst. Bilateral adrenal glands are normal in appearance. No hydroureteronephrosis. Urinary bladder is normal in appearance. Stomach/Bowel: The appearance of the stomach is normal. There is no pathologic dilatation of small bowel or colon. Numerous colonic diverticulae are noted, without surrounding inflammatory changes to suggest an acute diverticulitis at this time. Vascular/Lymphatic: Aortic atherosclerosis, without evidence of aneurysm or dissection in the abdominal or pelvic vasculature. No lymphadenopathy noted in the abdomen or pelvis. Reproductive: Uterus and ovaries are atrophic. Other: No significant volume of ascites.  No pneumoperitoneum. Musculoskeletal: Severe dextroscoliosis of the lumbar spine convex to the right at the level of L2. Multilevel degenerative disc disease and lumbar spondylosis. There are no aggressive appearing lytic or blastic lesions noted in the visualized portions of the skeleton. IMPRESSION: 1. No significant change in the appearance of the chest compared to recent prior high-resolution chest CT 10/26/2015, with imaging findings which remain compatible with usual interstitial pneumonia. 2. No acute findings in the abdomen or pelvis to account for the patient's symptoms. 3. Severe colonic diverticulosis without evidence of acute diverticulitis at this time. 4. Aortic atherosclerosis, in addition to left main and 3 vessel coronary artery disease. 5. There are calcifications of the aortic valve. Echocardiographic correlation for evaluation of potential valvular dysfunction may be warranted if clinically indicated. 6. Additional incidental findings, as above.  Electronically Signed   By: Vinnie Langton M.D.   On: 12/01/2015 21:03   Ct Chest High Resolution  Result Date: 12/01/2015 CLINICAL DATA:  82 year old female with respiratory failure. History of interstitial lung disease. Epigastric abdominal pain. EXAM: CT CHEST WITHOUT CONTRAST, AND CT ABDOMEN AND PELVIS WITH  CONTRAST TECHNIQUE: Multidetector CT imaging of the chest was performed following the standard protocol without intravenous contrast. High resolution imaging of the lungs, as well as inspiratory and expiratory imaging, was performed. Multidetector CT imaging of the abdomen and pelvis was performed following the standard protocol during bolus administration of intravenous contrast. CONTRAST:  19m ISOVUE-300 IOPAMIDOL (ISOVUE-300) INJECTION 61% COMPARISON:  High-resolution chest CT 10/26/2015. CT the abdomen and pelvis 04/16/2014. FINDINGS: CT CHEST FINDINGS Cardiovascular: Heart size is normal. There is no significant pericardial fluid, thickening or pericardial calcification. There is aortic atherosclerosis, as well as atherosclerosis of the great vessels of the mediastinum and the coronary arteries, including calcified atherosclerotic plaque in the left main, left anterior descending, left circumflex and coronary arteries. Calcifications of the aortic valve. Mediastinum/Nodes: No pathologically enlarged mediastinal or hilar lymph nodes. Multiple densely calcified right hilar and mediastinal lymph nodes are incidentally noted. Esophagus is unremarkable in appearance. No axillary lymphadenopathy. Lungs/Pleura: Again noted is extensive subpleural reticulation, parenchymal banding, traction bronchiectasis and frank honeycombing. Findings are essentially unchanged compared to prior study from 10/09/2015, and are again considered diagnostic of usual interstitial pneumonia (UIP) from an imaging perspective. No acute consolidative airspace disease. No pleural effusions. Musculoskeletal: There are no aggressive appearing lytic or blastic lesions noted in the visualized portions of the skeleton. CT ABDOMEN AND PELVIS FINDINGS Hepatobiliary: Sub cm low-attenuation lesion in segment 4A of the liver is too small to definitively characterize, but is statistically likely a tiny cyst. No other suspicious hepatic lesions are  noted. No intra or extrahepatic biliary ductal dilatation. Gallbladder is normal in appearance. Pancreas: No pancreatic mass. No pancreatic ductal dilatation. No pancreatic or peripancreatic fluid or inflammatory changes. Spleen: Unremarkable. Adrenals/Urinary Tract: A 2.1 cm simple cyst in the interpolar region of the right kidney. Sub cm low-attenuation lesion in the interpolar region of the left kidney is too small to characterize, but is statistically likely a tiny cyst. Bilateral adrenal glands are normal in appearance. No hydroureteronephrosis. Urinary bladder is normal in appearance. Stomach/Bowel: The appearance of the stomach is normal. There is no pathologic dilatation of small bowel or colon. Numerous colonic diverticulae are noted, without surrounding inflammatory changes to suggest an acute diverticulitis at this time. Vascular/Lymphatic: Aortic atherosclerosis, without evidence of aneurysm or dissection in the abdominal or pelvic vasculature. No lymphadenopathy noted in the abdomen or pelvis. Reproductive: Uterus and ovaries are atrophic. Other: No significant volume of ascites.  No pneumoperitoneum. Musculoskeletal: Severe dextroscoliosis of the lumbar spine convex to the right at the level of L2. Multilevel degenerative disc disease and lumbar spondylosis. There are no aggressive appearing lytic or blastic lesions noted in the visualized portions of the skeleton. IMPRESSION: 1. No significant change in the appearance of the chest compared to recent prior high-resolution chest CT 10/26/2015, with imaging findings which remain compatible with usual interstitial pneumonia. 2. No acute findings in the abdomen or pelvis to account for the patient's symptoms. 3. Severe colonic diverticulosis without evidence of acute diverticulitis at this time. 4. Aortic atherosclerosis, in addition to left main and 3 vessel coronary artery disease. 5. There are calcifications of the aortic valve. Echocardiographic  correlation for evaluation of potential valvular dysfunction may be warranted  if clinically indicated. 6. Additional incidental findings, as above. Electronically Signed   By: Vinnie Langton M.D.   On: 12/01/2015 21:03   Dg Chest Portable 1 View  Result Date: 12/01/2015 CLINICAL DATA:  Shortness of breath. EXAM: PORTABLE CHEST 1 VIEW COMPARISON:  None. FINDINGS: No pneumothorax. Findings of UIP, right greater than left, again identified and similar in the interval given in difference in technique. No definitive acute abnormality or change. IMPRESSION: Findings of UIP with no definitive acute abnormality. Electronically Signed   By: Dorise Bullion III M.D   On: 12/01/2015 17:40    ASSESSMENT / PLAN:  Donna Watts is an 10F with history of RA-ILD / UIP, mild pulmonary hypertension, chronic hypoxemic respiratory failure and diabetes who presented with epigastric pain. Her shortness of breath is at baseline, no increase in cough or sputum. She saturates adequately on what she is reporting for her current home O2 use (10L at rest). Her troponin is 0.02 with no acute ECG changes. Her CXR and Chest CT are unchanged from prior without evidence for acute ILD flare (no significant ground glass), suggesting that there would NOT be a role for increased immunosuppression. I do not feel that her pulmonary disease is acutely exacerbated. I cannot exclude that her epigastric pain is related to her pulmonary fibrosis, but other etiologies should be investigated including pancreatitis, GERD, and atypical cardiac pain.  From a pulmonary perspective, I do not feel she warrants admission or would benefit from an inpatient stay. However, should she be admitted for other reasons we will happily follow along.    Follow up as scheduled with pulmonology  Follow up with Dr. Haroldine Laws regarding trial of inhaled treprostinil  Continue current immunosuppression at current doses  Continue supplemental oxygen as needed to  maintain sats >88%  Please let us know if we can be of further assistance   Yisroel Ramming, MD [facility] [place] Pager: [phone]  12/01/2015, 8:56 PM

## 2015-12-01 NOTE — ED Notes (Signed)
RN tried to transition patient back to 6L nasal canula. Pt. Dropped her oxygen saturation down into mid 80's. Pt. Placed back on NRB mask. EDP notified.

## 2015-12-01 NOTE — Telephone Encounter (Signed)
We will see if we can get it approved.

## 2015-12-01 NOTE — ED Notes (Signed)
Updated son [name] via telephone at this time

## 2015-12-01 NOTE — ED Provider Notes (Signed)
[facility] DEPT Provider Note   CSN: [id number] Arrival date & time: 12/01/15  1532  First Provider Contact:  None       History   Chief Complaint Chief Complaint  Patient presents with  . Shortness of Breath  . Chest Pain    HPI Donna Watts is a 82 y.o. female.  Patient is an 82 year old female with past medical history of interstitial lung disease/pulmonary fibrosis followed by Dr. Chase Caller. She presents today for evaluation of worsening breathing. This has worsened over the past 2 weeks. She denies any fevers or chills. She denies any cough. She denies any chest discomfort. She was seen in the pulmonary clinic yesterday and different therapeutic regimens were discussed. From reading the note, does sound as though the patient may be approaching hospice and it appears as though her lung disease is end-stage.   The history is provided by the patient.  Shortness of Breath  This is a chronic problem. The average episode lasts 2 weeks. The problem occurs continuously.The problem has been gradually worsening. Associated symptoms include chest pain. Pertinent negatives include no fever. She has tried nothing for the symptoms.  Chest Pain   Associated symptoms include shortness of breath. Pertinent negatives include no fever.    Past Medical History:  Diagnosis Date  . Acid reflux   . Cancer ([place])    precancerous skin  . Diabetes mellitus without complication ([place])   . Esophageal reflux   . Other and unspecified hyperlipidemia   . Pulmonary fibrosis ([place])   . Rheumatoid arthritis [facility])     Patient Active Problem List   Diagnosis Date Noted  . Pulmonary hypertension ([place]) 11/30/2015  . ILD (interstitial lung disease) ([place]) 11/30/2015  . Excess ear wax 11/30/2015  . PAH (pulmonary artery hypertension) ([place])   . Acute bronchitis 12/07/2014  . Dry eye 08/29/2013  . Chemosis 08/29/2013  . 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 Meibomian gland disease  07/07/2013  . Deformity of eyelid 07/07/2013  . Rheumatoid arthritis involving multiple sites with positive rheumatoid factor ([place]) 07/07/2013  . Scar of eyelid 07/07/2013  . Diabetes ([place]) 09/23/2012  . Dyspnea 09/23/2012  . Hypoxemia 09/23/2012  . Awaiting organ transplant 09/23/2012  . Chronic respiratory failure ([place]) 07/07/2011  . GERD 09/06/2009  . PULMONARY FIBROSIS 10/12/2008  . Cough 10/12/2008    Past Surgical History:  Procedure Laterality Date  . APPENDECTOMY  1960's    OB History    No data available       Home Medications    Prior to Admission medications   Medication Sig Start Date End Date Taking? Authorizing Provider  ALPRAZolam Duanne Moron) 0.25 MG tablet Take 0.25 mg by mouth as needed for anxiety.  08/27/15  Yes Historical Provider, MD  aspirin EC 81 MG tablet Take 81 mg by mouth every morning.    Yes Historical Provider, MD  budesonide-formoterol (SYMBICORT) 160-4.5 MCG/ACT inhaler Inhale 2 puffs into the lungs at bedtime as needed (for coughing).    Yes Historical Provider, MD  Cholecalciferol (VITAMIN D) 2000 UNITS CAPS Take 2,000 Units by mouth daily.    Yes Historical Provider, MD  clonazePAM (KLONOPIN) 0.5 MG tablet Take 0.5 mg by mouth at bedtime as needed for anxiety.  09/18/15  Yes Historical Provider, MD  albuterol (PROVENTIL HFA;VENTOLIN HFA) 108 (90 Base) MCG/ACT inhaler Inhale 2 puffs into the lungs every 6 (six) hours as needed for wheezing or shortness of breath.    Historical Provider, MD  dextromethorphan-guaiFENesin (  MUCINEX DM) 30-600 MG per 12 hr tablet Take 1 tablet by mouth every 12 (twelve) hours as needed for cough.     Historical Provider, MD  ferrous sulfate 325 (65 FE) MG tablet Take 325 mg by mouth daily with breakfast.    Historical Provider, MD  fluticasone (FLONASE) 50 MCG/ACT nasal spray Place 2 sprays into both nostrils daily as needed for allergies or rhinitis.    Historical Provider, MD  ibuprofen (ADVIL,MOTRIN) 200 MG tablet Take  600 mg by mouth every 6 (six) hours as needed (For pain.). Reported on 10/25/2015    Historical Provider, MD  metFORMIN (GLUCOPHAGE-XR) 500 MG 24 hr tablet Take 1,000 mg by mouth daily with supper.     Historical Provider, MD  Multiple Vitamins-Minerals (CENTRUM SILVER PO) Take 1 tablet by mouth daily.      Historical Provider, MD  mycophenolate (CELLCEPT) 500 MG tablet Take 500 mg by mouth 2 (two) times daily.     Historical Provider, MD  omeprazole (PRILOSEC) 20 MG capsule Take 20 mg by mouth daily as needed (For heartburn or acid reflux.).     Historical Provider, MD  PARoxetine (PAXIL) 10 MG tablet Take 10 mg by mouth daily as needed (For depression.).  09/18/15   Historical Provider, MD  predniSONE (DELTASONE) 1 MG tablet Take 3 mg by mouth daily with breakfast.  10/17/14   Historical Provider, MD  predniSONE (DELTASONE) 5 MG tablet Take 5 mg by mouth at bedtime.     Historical Provider, MD  simvastatin (ZOCOR) 20 MG tablet Take 20 mg by mouth at bedtime.      Historical Provider, MD    Family History Family History  Problem Relation Age of Onset  . Heart disease Brother     Social History Social History  Substance Use Topics  . Smoking status: Never Smoker  . Smokeless tobacco: Never Used  . Alcohol use No     Allergies   Tramadol   Review of Systems Review of Systems  Constitutional: Negative for fever.  Respiratory: Positive for shortness of breath.   Cardiovascular: Positive for chest pain.  All other systems reviewed and are negative.    Physical Exam Updated Vital Signs BP 110/64   Pulse 92   Temp (S) 99.6 F (37.6 C) (Oral)   Resp (!) 34   Ht _0  (1.448 m)   Wt 97 lb (44 kg)   SpO2 100%   BMI 20.99 kg/m   Physical Exam  Constitutional: She is oriented to person, place, and time. She appears well-developed and well-nourished. No distress.  Patient is a thin 82 year old female. She appears anxious, but in no acute distress.  HENT:  Head: Normocephalic  and atraumatic.  Neck: Normal range of motion. Neck supple.  Cardiovascular: Normal rate and regular rhythm.  Exam reveals no gallop and no friction rub.   No murmur heard. Pulmonary/Chest: She is in respiratory distress. She has no wheezes. She has rales.  She was initially dyspneic and in moderate respiratory distress. This improved with a nonrebreather.  Abdominal: Soft. Bowel sounds are normal. She exhibits no distension. There is no tenderness.  Musculoskeletal: Normal range of motion.  Neurological: She is alert and oriented to person, place, and time.  Skin: Skin is warm and dry. She is not diaphoretic.  Nursing note and vitals reviewed.    ED Treatments / Results  Labs (all labs ordered are listed, but only abnormal results are displayed) Labs Reviewed  BASIC METABOLIC PANEL -  Abnormal; Notable for the following:       Result Value   Sodium 133 (*)    Potassium 5.3 (*)    Chloride 91 (*)    CO2 34 (*)    Glucose, Bld 156 (*)    All other components within normal limits  CBC - Abnormal; Notable for the following:    WBC 11.3 (*)    All other components within normal limits  I-STAT TROPOININ, ED    EKG  EKG Interpretation  Date/Time:  Saturday December 01 2015 16:46:18 EDT Ventricular Rate:  83 PR Interval:  114 QRS Duration: 87 QT Interval:  360 QTC Calculation: 423 R Axis:   -57 Text Interpretation:  Sinus rhythm Left axis deviation Left anterior fasicular block Left ventricular hypertrophy with repolarization abnormality No significant change from 10/29/15 Confirmed by Keldan Eplin  MD, [place] ([id number]) on 12/01/2015 4:50:43 PM       Radiology Dg Chest Portable 1 View  Result Date: 12/01/2015 CLINICAL DATA:  Shortness of breath. EXAM: PORTABLE CHEST 1 VIEW COMPARISON:  None. FINDINGS: No pneumothorax. Findings of UIP, right greater than left, again identified and similar in the interval given in difference in technique. No definitive acute abnormality or change. IMPRESSION:  Findings of UIP with no definitive acute abnormality. Electronically Signed   By: Dorise Bullion III M.D   On: 12/01/2015 17:40    Procedures Procedures (including critical care time)  Medications Ordered in ED Medications  LORazepam (ATIVAN) injection 1 mg (1 mg Intravenous Given 12/01/15 1740)     Initial Impression / Assessment and Plan / ED Course  I have reviewed the triage vital signs and the nursing notes.  Pertinent labs & imaging results that were available during my care of the patient were reviewed by me and considered in my medical decision making (see chart for details).  Clinical Course      Final Clinical Impressions(s) / ED Diagnoses   Final diagnoses:  Acute on chronic respiratory failure [facility])   Patient is an 82 year old female history of pulmonary fibrosis. She presents for evaluation of difficulty breathing and tightness in her epigastrium.  Her workup today reveals no significant change on her CT scan from prior study. There is also no intra-abdominal cause of her discomfort. She had a heart cath one month ago which revealed no significant stenoses in her troponin is negative with an unchanged EKG.  I had an extensive discussion with her son who is a PA with cardiology. He did not feel as though her lung issues needed any further workup and was requesting no imaging be done. Her chest CT had are been performed prior to this conversation. This reveals no changes from prior studies.  At this point, she will be discharged to home, with instructions to return as needed for any problems.  New Prescriptions New Prescriptions   No medications on file     Veryl Speak, MD 12/01/15 2333

## 2015-12-01 NOTE — ED Notes (Signed)
Dr. Gerilyn Nestle speaking with son via phone regarding patient's condition.

## 2015-12-01 NOTE — ED Notes (Signed)
[phone] [name] (son) please call with updates

## 2015-12-01 NOTE — ED Notes (Signed)
Pt. Arrived at the room on 6L nasal canula with a good wave form at 58% oxygen saturation. Pt. Placed on NRB and EDP advised.

## 2015-12-01 NOTE — ED Notes (Addendum)
Patient transported to CT via stretcher with NRB. O2 sats 100% on NRB. No resp distess noted.

## 2015-12-03 ENCOUNTER — Telehealth: Payer: Self-pay | Admitting: Internal Medicine

## 2015-12-03 NOTE — Telephone Encounter (Signed)
Donna Watts can you please get this started

## 2015-12-03 NOTE — Telephone Encounter (Signed)
Overall labs ok . Key feaures  . - sodium on low normal side - talk to pcp GATES,ROBERT NEVILL, MD  - potassium on high normal side - talk to pcp GATES,ROBERT NEVILL, MD   - ct chest/abd - nil acute - all chronic lung, heart, and diverticulosis   PULMONARY No results for input(s): PHART, PCO2ART, PO2ART, HCO3, TCO2, O2SAT in the last 168 hours.  Invalid input(s): PCO2, PO2  CBC  Recent Labs Lab 12/01/15 1539  HGB 12.2  HCT 39.8  WBC 11.3*  PLT 285    COAGULATION No results for input(s): INR in the last 168 hours.  CARDIAC  No results for input(s): TROPONINI in the last 168 hours. No results for input(s): PROBNP in the last 168 hours.   CHEMISTRY  Recent Labs Lab 12/01/15 1539  NA 133*  K 5.3*  CL 91*  CO2 34*  GLUCOSE 156*  BUN 13  CREATININE 0.45  CALCIUM 9.5   Estimated Creatinine Clearance: 31.9 mL/min (by C-G formula based on SCr of 0.8 mg/dL).   LIVER  Recent Labs Lab 12/01/15 1600  AST 27  ALT 21  ALKPHOS 55  BILITOT 0.4  PROT 8.4*  ALBUMIN 3.9     INFECTIOUS No results for input(s): LATICACIDVEN, PROCALCITON in the last 168 hours.   ENDOCRINE CBG (last 3)  No results for input(s): GLUCAP in the last 72 hours.     Ct Abdomen Pelvis W Contrast  Result Date: 12/01/2015 CLINICAL DATA:  82 year old female with respiratory failure. History of interstitial lung disease. Epigastric abdominal pain. EXAM: CT CHEST WITHOUT CONTRAST, AND CT ABDOMEN AND PELVIS WITH CONTRAST TECHNIQUE: Multidetector CT imaging of the chest was performed following the standard protocol without intravenous contrast. High resolution imaging of the lungs, as well as inspiratory and expiratory imaging, was performed. Multidetector CT imaging of the abdomen and pelvis was performed following the standard protocol during bolus administration of intravenous contrast. CONTRAST:  38m ISOVUE-300 IOPAMIDOL (ISOVUE-300) INJECTION 61% COMPARISON:  High-resolution chest CT  10/26/2015. CT the abdomen and pelvis 04/16/2014. FINDINGS: CT CHEST FINDINGS Cardiovascular: Heart size is normal. There is no significant pericardial fluid, thickening or pericardial calcification. There is aortic atherosclerosis, as well as atherosclerosis of the great vessels of the mediastinum and the coronary arteries, including calcified atherosclerotic plaque in the left main, left anterior descending, left circumflex and coronary arteries. Calcifications of the aortic valve. Mediastinum/Nodes: No pathologically enlarged mediastinal or hilar lymph nodes. Multiple densely calcified right hilar and mediastinal lymph nodes are incidentally noted. Esophagus is unremarkable in appearance. No axillary lymphadenopathy. Lungs/Pleura: Again noted is extensive subpleural reticulation, parenchymal banding, traction bronchiectasis and frank honeycombing. Findings are essentially unchanged compared to prior study from 10/09/2015, and are again considered diagnostic of usual interstitial pneumonia (UIP) from an imaging perspective. No acute consolidative airspace disease. No pleural effusions. Musculoskeletal: There are no aggressive appearing lytic or blastic lesions noted in the visualized portions of the skeleton. CT ABDOMEN AND PELVIS FINDINGS Hepatobiliary: Sub cm low-attenuation lesion in segment 4A of the liver is too small to definitively characterize, but is statistically likely a tiny cyst. No other suspicious hepatic lesions are noted. No intra or extrahepatic biliary ductal dilatation. Gallbladder is normal in appearance. Pancreas: No pancreatic mass. No pancreatic ductal dilatation. No pancreatic or peripancreatic fluid or inflammatory changes. Spleen: Unremarkable. Adrenals/Urinary Tract: A 2.1 cm simple cyst in the interpolar region of the right kidney. Sub cm low-attenuation lesion in the interpolar region of the left kidney is too small  to characterize, but is statistically likely a tiny cyst. Bilateral  adrenal glands are normal in appearance. No hydroureteronephrosis. Urinary bladder is normal in appearance. Stomach/Bowel: The appearance of the stomach is normal. There is no pathologic dilatation of small bowel or colon. Numerous colonic diverticulae are noted, without surrounding inflammatory changes to suggest an acute diverticulitis at this time. Vascular/Lymphatic: Aortic atherosclerosis, without evidence of aneurysm or dissection in the abdominal or pelvic vasculature. No lymphadenopathy noted in the abdomen or pelvis. Reproductive: Uterus and ovaries are atrophic. Other: No significant volume of ascites.  No pneumoperitoneum. Musculoskeletal: Severe dextroscoliosis of the lumbar spine convex to the right at the level of L2. Multilevel degenerative disc disease and lumbar spondylosis. There are no aggressive appearing lytic or blastic lesions noted in the visualized portions of the skeleton. IMPRESSION: 1. No significant change in the appearance of the chest compared to recent prior high-resolution chest CT 10/26/2015, with imaging findings which remain compatible with usual interstitial pneumonia. 2. No acute findings in the abdomen or pelvis to account for the patient's symptoms. 3. Severe colonic diverticulosis without evidence of acute diverticulitis at this time. 4. Aortic atherosclerosis, in addition to left main and 3 vessel coronary artery disease. 5. There are calcifications of the aortic valve. Echocardiographic correlation for evaluation of potential valvular dysfunction may be warranted if clinically indicated. 6. Additional incidental findings, as above. Electronically Signed   By: Vinnie Langton M.D.   On: 12/01/2015 21:03   Ct Chest High Resolution  Result Date: 12/01/2015 CLINICAL DATA:  82 year old female with respiratory failure. History of interstitial lung disease. Epigastric abdominal pain. EXAM: CT CHEST WITHOUT CONTRAST, AND CT ABDOMEN AND PELVIS WITH CONTRAST TECHNIQUE:  Multidetector CT imaging of the chest was performed following the standard protocol without intravenous contrast. High resolution imaging of the lungs, as well as inspiratory and expiratory imaging, was performed. Multidetector CT imaging of the abdomen and pelvis was performed following the standard protocol during bolus administration of intravenous contrast. CONTRAST:  12m ISOVUE-300 IOPAMIDOL (ISOVUE-300) INJECTION 61% COMPARISON:  High-resolution chest CT 10/26/2015. CT the abdomen and pelvis 04/16/2014. FINDINGS: CT CHEST FINDINGS Cardiovascular: Heart size is normal. There is no significant pericardial fluid, thickening or pericardial calcification. There is aortic atherosclerosis, as well as atherosclerosis of the great vessels of the mediastinum and the coronary arteries, including calcified atherosclerotic plaque in the left main, left anterior descending, left circumflex and coronary arteries. Calcifications of the aortic valve. Mediastinum/Nodes: No pathologically enlarged mediastinal or hilar lymph nodes. Multiple densely calcified right hilar and mediastinal lymph nodes are incidentally noted. Esophagus is unremarkable in appearance. No axillary lymphadenopathy. Lungs/Pleura: Again noted is extensive subpleural reticulation, parenchymal banding, traction bronchiectasis and frank honeycombing. Findings are essentially unchanged compared to prior study from 10/09/2015, and are again considered diagnostic of usual interstitial pneumonia (UIP) from an imaging perspective. No acute consolidative airspace disease. No pleural effusions. Musculoskeletal: There are no aggressive appearing lytic or blastic lesions noted in the visualized portions of the skeleton. CT ABDOMEN AND PELVIS FINDINGS Hepatobiliary: Sub cm low-attenuation lesion in segment 4A of the liver is too small to definitively characterize, but is statistically likely a tiny cyst. No other suspicious hepatic lesions are noted. No intra or  extrahepatic biliary ductal dilatation. Gallbladder is normal in appearance. Pancreas: No pancreatic mass. No pancreatic ductal dilatation. No pancreatic or peripancreatic fluid or inflammatory changes. Spleen: Unremarkable. Adrenals/Urinary Tract: A 2.1 cm simple cyst in the interpolar region of the right kidney. Sub cm low-attenuation lesion in the  interpolar region of the left kidney is too small to characterize, but is statistically likely a tiny cyst. Bilateral adrenal glands are normal in appearance. No hydroureteronephrosis. Urinary bladder is normal in appearance. Stomach/Bowel: The appearance of the stomach is normal. There is no pathologic dilatation of small bowel or colon. Numerous colonic diverticulae are noted, without surrounding inflammatory changes to suggest an acute diverticulitis at this time. Vascular/Lymphatic: Aortic atherosclerosis, without evidence of aneurysm or dissection in the abdominal or pelvic vasculature. No lymphadenopathy noted in the abdomen or pelvis. Reproductive: Uterus and ovaries are atrophic. Other: No significant volume of ascites.  No pneumoperitoneum. Musculoskeletal: Severe dextroscoliosis of the lumbar spine convex to the right at the level of L2. Multilevel degenerative disc disease and lumbar spondylosis. There are no aggressive appearing lytic or blastic lesions noted in the visualized portions of the skeleton. IMPRESSION: 1. No significant change in the appearance of the chest compared to recent prior high-resolution chest CT 10/26/2015, with imaging findings which remain compatible with usual interstitial pneumonia. 2. No acute findings in the abdomen or pelvis to account for the patient's symptoms. 3. Severe colonic diverticulosis without evidence of acute diverticulitis at this time. 4. Aortic atherosclerosis, in addition to left main and 3 vessel coronary artery disease. 5. There are calcifications of the aortic valve. Echocardiographic correlation for  evaluation of potential valvular dysfunction may be warranted if clinically indicated. 6. Additional incidental findings, as above. Electronically Signed   By: Vinnie Langton M.D.   On: 12/01/2015 21:03   Dg Chest Portable 1 View  Result Date: 12/01/2015 CLINICAL DATA:  Shortness of breath. EXAM: PORTABLE CHEST 1 VIEW COMPARISON:  None. FINDINGS: No pneumothorax. Findings of UIP, right greater than left, again identified and similar in the interval given in difference in technique. No definitive acute abnormality or change. IMPRESSION: Findings of UIP with no definitive acute abnormality. Electronically Signed   By: Dorise Bullion III M.D   On: 12/01/2015 17:40

## 2015-12-03 NOTE — Telephone Encounter (Signed)
Called and spoke to pt's son, [name]. [name] wanted to inform MR that pt went to [facility] ED on 8/5 and a CT abdomen and chest were obtained and wanted MR to be aware.   Will send to MR as FYI.

## 2015-12-04 NOTE — Telephone Encounter (Signed)
Spoke with pt's son, aware of results/recs.  Pt's son states that he was calling as a FYI and not for results.  I apologized for the misunderstanding.  Nothing further needed.

## 2015-12-12 DIAGNOSIS — M359 Systemic involvement of connective tissue, unspecified: Secondary | ICD-10-CM

## 2015-12-12 DIAGNOSIS — I272 Other secondary pulmonary hypertension: Secondary | ICD-10-CM | POA: Diagnosis not present

## 2015-12-27 ENCOUNTER — Encounter (HOSPITAL_COMMUNITY): Payer: Medicare Other | Admitting: Internal Medicine

## 2015-12-30 ENCOUNTER — Encounter (HOSPITAL_COMMUNITY): Payer: Self-pay

## 2015-12-30 ENCOUNTER — Emergency Department (HOSPITAL_COMMUNITY): Payer: Medicare Other

## 2015-12-30 ENCOUNTER — Observation Stay (HOSPITAL_COMMUNITY)
Admission: EM | Admit: 2015-12-30 | Discharge: 2015-12-31 | Disposition: A | Discharge status: Hospice | Payer: Medicare Other | Attending: Internal Medicine | Admitting: Internal Medicine

## 2015-12-30 DIAGNOSIS — Z85820 Personal history of malignant melanoma of skin: Secondary | ICD-10-CM | POA: Diagnosis not present

## 2015-12-30 DIAGNOSIS — Z7952 Long term (current) use of systemic steroids: Secondary | ICD-10-CM | POA: Insufficient documentation

## 2015-12-30 DIAGNOSIS — Z79899 Other long term (current) drug therapy: Secondary | ICD-10-CM | POA: Insufficient documentation

## 2015-12-30 DIAGNOSIS — Z9981 Dependence on supplemental oxygen: Secondary | ICD-10-CM | POA: Diagnosis not present

## 2015-12-30 DIAGNOSIS — J84112 Idiopathic pulmonary fibrosis: Principal | ICD-10-CM | POA: Insufficient documentation

## 2015-12-30 DIAGNOSIS — I2721 Secondary pulmonary arterial hypertension: Secondary | ICD-10-CM | POA: Diagnosis present

## 2015-12-30 DIAGNOSIS — D539 Nutritional anemia, unspecified: Secondary | ICD-10-CM | POA: Diagnosis not present

## 2015-12-30 DIAGNOSIS — R748 Abnormal levels of other serum enzymes: Secondary | ICD-10-CM | POA: Diagnosis not present

## 2015-12-30 DIAGNOSIS — K219 Gastro-esophageal reflux disease without esophagitis: Secondary | ICD-10-CM | POA: Insufficient documentation

## 2015-12-30 DIAGNOSIS — Z515 Encounter for palliative care: Secondary | ICD-10-CM

## 2015-12-30 DIAGNOSIS — F411 Generalized anxiety disorder: Secondary | ICD-10-CM

## 2015-12-30 DIAGNOSIS — R0602 Shortness of breath: Secondary | ICD-10-CM | POA: Diagnosis present

## 2015-12-30 DIAGNOSIS — Z7982 Long term (current) use of aspirin: Secondary | ICD-10-CM | POA: Insufficient documentation

## 2015-12-30 DIAGNOSIS — I272 Other secondary pulmonary hypertension: Secondary | ICD-10-CM | POA: Insufficient documentation

## 2015-12-30 DIAGNOSIS — Z66 Do not resuscitate: Secondary | ICD-10-CM | POA: Insufficient documentation

## 2015-12-30 DIAGNOSIS — E785 Hyperlipidemia, unspecified: Secondary | ICD-10-CM | POA: Diagnosis not present

## 2015-12-30 DIAGNOSIS — E8770 Fluid overload, unspecified: Secondary | ICD-10-CM | POA: Diagnosis not present

## 2015-12-30 DIAGNOSIS — Z7189 Other specified counseling: Secondary | ICD-10-CM

## 2015-12-30 DIAGNOSIS — J9621 Acute and chronic respiratory failure with hypoxia: Secondary | ICD-10-CM | POA: Diagnosis not present

## 2015-12-30 DIAGNOSIS — M069 Rheumatoid arthritis, unspecified: Secondary | ICD-10-CM | POA: Insufficient documentation

## 2015-12-30 DIAGNOSIS — E871 Hypo-osmolality and hyponatremia: Secondary | ICD-10-CM | POA: Insufficient documentation

## 2015-12-30 DIAGNOSIS — E119 Type 2 diabetes mellitus without complications: Secondary | ICD-10-CM | POA: Diagnosis not present

## 2015-12-30 DIAGNOSIS — Z7984 Long term (current) use of oral hypoglycemic drugs: Secondary | ICD-10-CM | POA: Insufficient documentation

## 2015-12-30 DIAGNOSIS — J849 Interstitial pulmonary disease, unspecified: Secondary | ICD-10-CM

## 2015-12-30 LAB — I-STAT TROPONIN, ED: Troponin i, poc: 1.11 ng/mL (ref 0.00–0.08)

## 2015-12-30 LAB — COMPREHENSIVE METABOLIC PANEL
ALK PHOS: 52 U/L (ref 38–126)
ALT: 35 U/L (ref 14–54)
ANION GAP: 8 (ref 5–15)
AST: 44 U/L — ABNORMAL HIGH (ref 15–41)
Albumin: 3.3 g/dL — ABNORMAL LOW (ref 3.5–5.0)
BILIRUBIN TOTAL: 0.5 mg/dL (ref 0.3–1.2)
BUN: 13 mg/dL (ref 6–20)
CALCIUM: 9 mg/dL (ref 8.9–10.3)
CO2: 34 mmol/L — AB (ref 22–32)
CREATININE: 0.48 mg/dL (ref 0.44–1.00)
Chloride: 88 mmol/L — ABNORMAL LOW (ref 101–111)
GFR calc non Af Amer: >60 mL/min (ref >60)
Glucose, Bld: 174 mg/dL — ABNORMAL HIGH (ref 65–99)
Potassium: 4.4 mmol/L (ref 3.5–5.1)
Sodium: 130 mmol/L — ABNORMAL LOW (ref 135–145)
TOTAL PROTEIN: 7 g/dL (ref 6.5–8.1)

## 2015-12-30 LAB — I-STAT CHEM 8, ED
BUN: 17 mg/dL (ref 6–20)
CALCIUM ION: 1.08 mmol/L — AB (ref 1.15–1.40)
CREATININE: 0.5 mg/dL (ref 0.44–1.00)
Chloride: 85 mmol/L — ABNORMAL LOW (ref 101–111)
Glucose, Bld: 175 mg/dL — ABNORMAL HIGH (ref 65–99)
HEMATOCRIT: 38 % (ref 36.0–46.0)
HEMOGLOBIN: 12.9 g/dL (ref 12.0–15.0)
Potassium: 4.3 mmol/L (ref 3.5–5.1)
SODIUM: 130 mmol/L — AB (ref 135–145)
TCO2: 35 mmol/L (ref 0–100)

## 2015-12-30 LAB — CBC
HEMATOCRIT: 34.5 % — AB (ref 36.0–46.0)
HEMOGLOBIN: 10.4 g/dL — AB (ref 12.0–15.0)
MCH: 27.3 pg (ref 26.0–34.0)
MCHC: 30.1 g/dL (ref 30.0–36.0)
MCV: 90.6 fL (ref 78.0–100.0)
Platelets: 245 10*3/uL (ref 150–400)
RBC: 3.81 MIL/uL — AB (ref 3.87–5.11)
RDW: 13.9 % (ref 11.5–15.5)
WBC: 10.5 10*3/uL (ref 4.0–10.5)

## 2015-12-30 LAB — CBC WITH DIFFERENTIAL/PLATELET
Basophils Absolute: 0 10*3/uL (ref 0.0–0.1)
Basophils Relative: 0 %
EOS ABS: 0.3 10*3/uL (ref 0.0–0.7)
Eosinophils Relative: 3 %
HEMATOCRIT: 36.2 % (ref 36.0–46.0)
HEMOGLOBIN: 10.9 g/dL — AB (ref 12.0–15.0)
LYMPHS ABS: 1.1 10*3/uL (ref 0.7–4.0)
LYMPHS PCT: 11 %
MCH: 27.4 pg (ref 26.0–34.0)
MCHC: 30.1 g/dL (ref 30.0–36.0)
MCV: 91 fL (ref 78.0–100.0)
MONOS PCT: 6 %
Monocytes Absolute: 0.6 10*3/uL (ref 0.1–1.0)
NEUTROS ABS: 8.4 10*3/uL — AB (ref 1.7–7.7)
NEUTROS PCT: 80 %
Platelets: 244 10*3/uL (ref 150–400)
RBC: 3.98 MIL/uL (ref 3.87–5.11)
RDW: 13.9 % (ref 11.5–15.5)
WBC: 10.5 10*3/uL (ref 4.0–10.5)

## 2015-12-30 LAB — I-STAT ARTERIAL BLOOD GAS, ED
ACID-BASE EXCESS: 12 mmol/L — AB (ref 0.0–2.0)
BICARBONATE: 39.4 mmol/L — AB (ref 20.0–28.0)
O2 SAT: 99 %
PO2 ART: 137 mmHg — AB (ref 83.0–108.0)
TCO2: 41 mmol/L (ref 0–100)
pCO2 arterial: 65.2 mmHg (ref 32.0–48.0)
pH, Arterial: 7.391 (ref 7.350–7.450)

## 2015-12-30 LAB — I-STAT CG4 LACTIC ACID, ED
Lactic Acid, Venous: 1.72 mmol/L (ref 0.5–1.9)
Lactic Acid, Venous: 1.69 mmol/L (ref 0.5–1.9)

## 2015-12-30 LAB — BRAIN NATRIURETIC PEPTIDE: B Natriuretic Peptide: 635.4 pg/mL — ABNORMAL HIGH (ref 0.0–100.0)

## 2015-12-30 LAB — CREATININE, SERUM: CREATININE: 0.49 mg/dL (ref 0.44–1.00)

## 2015-12-30 LAB — TROPONIN I: TROPONIN I: 1.19 ng/mL — AB (ref <0.03)

## 2015-12-30 LAB — D-DIMER, QUANTITATIVE (NOT AT ARMC): D DIMER QUANT: 1.4 ug{FEU}/mL — AB (ref 0.00–0.50)

## 2015-12-30 MED ORDER — ALPRAZOLAM 0.5 MG PO TABS
0.2500 mg | ORAL_TABLET | Freq: Three times a day (TID) | ORAL | Status: DC | PRN
Start: 2015-12-30 — End: 2015-12-31
  Administered 2015-12-30 – 2015-12-31 (×2): 0.25 mg via ORAL
  Filled 2015-12-30 (×2): qty 1

## 2015-12-30 MED ORDER — ONDANSETRON HCL 4 MG PO TABS: 4.0000 mg | ORAL_TABLET | Freq: Four times a day (QID) | ORAL | Status: DC | PRN

## 2015-12-30 MED ORDER — MYCOPHENOLATE MOFETIL 250 MG PO CAPS
500.0000 mg | ORAL_CAPSULE | Freq: Two times a day (BID) | ORAL | Status: DC
  Administered 2015-12-31: 500 mg via ORAL
  Filled 2015-12-30 (×2): qty 2

## 2015-12-30 MED ORDER — INSULIN ASPART 100 UNIT/ML ~~LOC~~ SOLN
0.0000 [IU] | Freq: Three times a day (TID) | SUBCUTANEOUS | Status: DC
Start: 2015-12-31 — End: 2015-12-31
  Administered 2015-12-31: 2 [IU] via SUBCUTANEOUS

## 2015-12-30 MED ORDER — PAROXETINE HCL 10 MG PO TABS
10.0000 mg | ORAL_TABLET | Freq: Every day | ORAL | Status: DC
  Administered 2015-12-31: 10 mg via ORAL
  Filled 2015-12-30: qty 0.5

## 2015-12-30 MED ORDER — ENOXAPARIN SODIUM 30 MG/0.3ML ~~LOC~~ SOLN
30.0000 mg | Freq: Every day | SUBCUTANEOUS | Status: DC
  Administered 2015-12-31: 30 mg via SUBCUTANEOUS
  Filled 2015-12-30: qty 0.3

## 2015-12-30 MED ORDER — CLONAZEPAM 0.5 MG PO TABS: 0.5000 mg | ORAL_TABLET | Freq: Every evening | ORAL | Status: DC | PRN

## 2015-12-30 MED ORDER — PANTOPRAZOLE SODIUM 40 MG PO TBEC
40.0000 mg | DELAYED_RELEASE_TABLET | Freq: Every day | ORAL | Status: DC
  Administered 2015-12-31: 40 mg via ORAL
  Filled 2015-12-30: qty 1

## 2015-12-30 MED ORDER — ONDANSETRON HCL 4 MG/2ML IJ SOLN: 4.0000 mg | Freq: Four times a day (QID) | INTRAMUSCULAR | Status: DC | PRN

## 2015-12-30 MED ORDER — ALBUTEROL SULFATE (2.5 MG/3ML) 0.083% IN NEBU: 2.5000 mg | INHALATION_SOLUTION | Freq: Four times a day (QID) | RESPIRATORY_TRACT | Status: DC | PRN

## 2015-12-30 MED ORDER — FLUTICASONE PROPIONATE 50 MCG/ACT NA SUSP: 2.0000 | Freq: Every day | NASAL | Status: DC | PRN

## 2015-12-30 MED ORDER — PREDNISONE 1 MG PO TABS
3.0000 mg | ORAL_TABLET | Freq: Every day | ORAL | Status: DC
  Administered 2015-12-31: 3 mg via ORAL
  Filled 2015-12-30: qty 3

## 2015-12-30 MED ORDER — ASPIRIN EC 81 MG PO TBEC
81.0000 mg | DELAYED_RELEASE_TABLET | Freq: Every day | ORAL | Status: DC
Start: 2015-12-31 — End: 2015-12-31

## 2015-12-30 MED ORDER — PREDNISONE 5 MG PO TABS
5.0000 mg | ORAL_TABLET | Freq: Every day | ORAL | Status: DC
  Filled 2015-12-30: qty 1

## 2015-12-30 MED ORDER — MOMETASONE FURO-FORMOTEROL FUM 200-5 MCG/ACT IN AERO
2.0000 | INHALATION_SPRAY | Freq: Two times a day (BID) | RESPIRATORY_TRACT | Status: DC
  Administered 2015-12-31: 2 via RESPIRATORY_TRACT
  Filled 2015-12-30: qty 8.8

## 2015-12-30 MED ORDER — SIMVASTATIN 20 MG PO TABS
20.0000 mg | ORAL_TABLET | Freq: Every day | ORAL | Status: DC
  Administered 2015-12-31: 20 mg via ORAL
  Filled 2015-12-30: qty 1

## 2015-12-30 MED ORDER — FERROUS SULFATE 325 (65 FE) MG PO TABS
325.0000 mg | ORAL_TABLET | Freq: Every day | ORAL | Status: DC
  Administered 2015-12-31: 325 mg via ORAL
  Filled 2015-12-30: qty 1

## 2015-12-30 MED ORDER — OXYMETAZOLINE HCL 0.05 % NA SOLN
2.0000 | Freq: Once | NASAL | Status: AC
  Administered 2015-12-30: 2 via NASAL
  Filled 2015-12-30: qty 15

## 2015-12-30 MED ORDER — ACETAMINOPHEN 325 MG PO TABS: 650.0000 mg | ORAL_TABLET | Freq: Four times a day (QID) | ORAL | Status: DC | PRN

## 2015-12-30 MED ORDER — ACETAMINOPHEN 650 MG RE SUPP: 650.0000 mg | Freq: Four times a day (QID) | RECTAL | Status: DC | PRN

## 2015-12-30 MED ORDER — ASPIRIN 81 MG PO CHEW
324.0000 mg | CHEWABLE_TABLET | Freq: Once | ORAL | Status: AC
  Administered 2015-12-30: 324 mg via ORAL
  Filled 2015-12-30 (×2): qty 4

## 2015-12-30 MED ORDER — VITAMIN D 1000 UNITS PO TABS
2000.0000 [IU] | ORAL_TABLET | Freq: Every day | ORAL | Status: DC
  Administered 2015-12-31: 2000 [IU] via ORAL
  Filled 2015-12-30: qty 2

## 2015-12-30 MED ORDER — FUROSEMIDE 10 MG/ML IJ SOLN
20.0000 mg | Freq: Once | INTRAMUSCULAR | Status: AC
  Administered 2015-12-30: 20 mg via INTRAVENOUS
  Filled 2015-12-30: qty 2

## 2015-12-30 NOTE — ED Triage Notes (Signed)
To room via EMS from home.  Pt has end stage pulmonary fibrosis.  Has been taking Xanax for past month for panic attacks.  On home O2 @ 5-6 liters and increased to 8-9 liters.  When EMS arrived to home pt was on 15L, c/o nose feeling stuffy, was changed to NRB.  Initial respirations 68, decreased to 48.  Pt was on hospice until around 3 weeks ago when she was started on Tyvasco inhaler, this was stopped 3 days ago.

## 2015-12-30 NOTE — ED Notes (Signed)
Report attempted.

## 2015-12-30 NOTE — ED Provider Notes (Signed)
[facility] DEPT Provider Note   CSN: [id number] Arrival date & time: 12/30/15  1720     History   Chief Complaint Chief Complaint  Patient presents with  . Shortness of Breath    HPI Donna Watts is a 82 y.o. female.  82 year old female with past medical history of severe pulmonary fibrosis secondary to rheumatoid arthritis who presents with shortness of breath. Patient states that over the last several weeks. She's had progressively worsening shortness of breath. She is on 10-15 L of oxygen at baseline and has had persistently worsening desaturations with any movement, down to as low as the 60s with any walking. Over the last 24 hours. She's had progressively worsening bilateral nasal congestion and drainage. Earlier today the patient began to state that she could not breathe through her nose and felt extremely short of breath. She became panicked and agitated and family was unable to calm her breathing down. She began to desaturate to the 60s and 70s despite being on 15 L. She subsequent brought to the ED for evaluation. Currently, she endorses severe nasal congestion, shortness of breath. She has mild chest pressure, but this is chronic. Denies any fevers or chills. Denies any recent increase in sputum production.   The history is provided by the patient, a relative and medical records.  Shortness of Breath  Associated symptoms include cough. Pertinent negatives include no fever, no sore throat, no chest pain, no vomiting, no abdominal pain and no rash.    Past Medical History:  Diagnosis Date  . Acid reflux   . Cancer ([place])    precancerous skin  . Diabetes mellitus without complication ([place])   . Esophageal reflux   . Other and unspecified hyperlipidemia   . Pulmonary fibrosis ([place])   . Rheumatoid arthritis [facility])     Patient Active Problem List   Diagnosis Date Noted  . SOB (shortness of breath) 12/30/2015  . Diabetes mellitus type 2 in nonobese ([place]) 12/30/2015  .  Pulmonary hypertension ([place]) 11/30/2015  . ILD (interstitial lung disease) ([place]) 11/30/2015  . Excess ear wax 11/30/2015  . PAH (pulmonary artery hypertension) ([place])   . Acute bronchitis 12/07/2014  . Dry eye 08/29/2013  . Chemosis 08/29/2013  . 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 Meibomian gland disease 07/07/2013  . Deformity of eyelid 07/07/2013  . Rheumatoid arthritis involving multiple sites with positive rheumatoid factor ([place]) 07/07/2013  . Scar of eyelid 07/07/2013  . Diabetes ([place]) 09/23/2012  . Dyspnea 09/23/2012  . Hypoxemia 09/23/2012  . Awaiting organ transplant 09/23/2012  . Acute and chronic respiratory failure with hypoxia ([place]) 07/07/2011  . GERD 09/06/2009  . PULMONARY FIBROSIS 10/12/2008  . Cough 10/12/2008    Past Surgical History:  Procedure Laterality Date  . APPENDECTOMY  1960's    OB History    No data available       Home Medications    Prior to Admission medications   Medication Sig Start Date End Date Taking? Authorizing Provider  albuterol (PROVENTIL HFA;VENTOLIN HFA) 108 (90 Base) MCG/ACT inhaler Inhale 2 puffs into the lungs every 6 (six) hours as needed for wheezing or shortness of breath.   Yes Historical Provider, MD  aspirin EC 81 MG tablet Take 81 mg by mouth every morning.    Yes Historical Provider, MD  budesonide-formoterol (SYMBICORT) 160-4.5 MCG/ACT inhaler Inhale 2 puffs into the lungs at bedtime as needed (for coughing).    Yes Historical Provider, MD  Cholecalciferol (VITAMIN D) 2000 UNITS CAPS  Take 2,000 Units by mouth daily.    Yes Historical Provider, MD  clonazePAM (KLONOPIN) 0.5 MG tablet Take 0.5 mg by mouth at bedtime as needed for anxiety.  09/18/15  Yes Historical Provider, MD  ferrous sulfate 325 (65 FE) MG tablet Take 325 mg by mouth daily with breakfast.   Yes Historical Provider, MD  fluticasone (FLONASE) 50 MCG/ACT nasal spray Place 2 sprays into both nostrils daily as needed for allergies or rhinitis.    Yes Historical Provider, MD  guaifenesin (ROBITUSSIN) 100 MG/5ML syrup Take 200 mg by mouth 3 (three) times daily as needed for cough.   Yes Historical Provider, MD  ibuprofen (ADVIL,MOTRIN) 200 MG tablet Take 600 mg by mouth every 6 (six) hours as needed (For pain.). Reported on 10/25/2015   Yes Historical Provider, MD  metFORMIN (GLUCOPHAGE-XR) 500 MG 24 hr tablet Take 500 mg by mouth 2 (two) times daily.    Yes Historical Provider, MD  Multiple Vitamins-Minerals (CENTRUM SILVER PO) Take 1 tablet by mouth daily.     Yes Historical Provider, MD  mycophenolate (CELLCEPT) 500 MG tablet Take 500 mg by mouth 2 (two) times daily.    Yes Historical Provider, MD  omeprazole (PRILOSEC) 20 MG capsule Take 20 mg by mouth daily as needed (For heartburn or acid reflux.).    Yes Historical Provider, MD  PARoxetine (PAXIL) 10 MG tablet Take 10 mg by mouth every morning.  09/18/15  Yes Historical Provider, MD  predniSONE (DELTASONE) 1 MG tablet Take 3 mg by mouth daily with breakfast.  10/17/14  Yes Historical Provider, MD  predniSONE (DELTASONE) 5 MG tablet Take 5 mg by mouth at bedtime.    Yes Historical Provider, MD  simvastatin (ZOCOR) 20 MG tablet Take 20 mg by mouth every morning.    Yes Historical Provider, MD  ALPRAZolam (XANAX) 0.25 MG tablet Take 1 tablet (0.25 mg total) by mouth as needed for anxiety. 12/31/15   Verlee Monte, MD  Morphine Sulfate (MORPHINE CONCENTRATE) 10 mg / 0.5 ml concentrated solution Take 0.25 mLs (5 mg total) by mouth every 4 (four) hours as needed for severe pain or shortness of breath. 12/31/15   Verlee Monte, MD    Family History Family History  Problem Relation Age of Onset  . Heart disease Brother     Social History Social History  Substance Use Topics  . Smoking status: Never Smoker  . Smokeless tobacco: Never Used  . Alcohol use No     Allergies   Tramadol   Review of Systems Review of Systems  Constitutional: Positive for fatigue. Negative for chills and  fever.  HENT: Positive for congestion and sinus pressure. Negative for sore throat and trouble swallowing.   Eyes: Negative for visual disturbance.  Respiratory: Positive for cough, chest tightness and shortness of breath.   Cardiovascular: Negative for chest pain.  Gastrointestinal: Negative for abdominal pain, nausea and vomiting.  Genitourinary: Negative for dysuria and flank pain.  Musculoskeletal: Negative for neck stiffness.  Skin: Negative for rash.  Neurological: Positive for weakness. Negative for syncope.     Physical Exam Updated Vital Signs BP 120/69 (BP Location: Left Arm)   Pulse 89   Temp 98.1 F (36.7 C) (Oral)   Resp (!) 37   Ht _0  (1.473 m)   Wt 96 lb 5.5 oz (43.7 kg)   SpO2 98%   BMI 20.14 kg/m   Physical Exam  Constitutional: She appears well-developed. She appears distressed.  HENT:  Head: Normocephalic and  atraumatic.  Mouth/Throat: Oropharynx is clear and moist.  Eyes: Conjunctivae are normal. Pupils are equal, round, and reactive to light.  Neck: Normal range of motion. Neck supple.  Cardiovascular: Regular rhythm, normal heart sounds and intact distal pulses.  Tachycardia present.  Exam reveals no friction rub.   No murmur heard. Pulmonary/Chest: Accessory muscle usage present. Tachypnea noted. She is in respiratory distress. She has decreased breath sounds. She has rhonchi in the right upper field, the right middle field, the right lower field, the left upper field, the left middle field and the left lower field.  Abdominal: Soft. She exhibits no distension. There is no tenderness.  Musculoskeletal: She exhibits no edema.  Neurological: She is alert. She exhibits normal muscle tone.  Skin: Skin is warm. Capillary refill takes less than 2 seconds.  Nursing note and vitals reviewed.    ED Treatments / Results  Labs (all labs ordered are listed, but only abnormal results are displayed) Labs Reviewed  CBC WITH DIFFERENTIAL/PLATELET - Abnormal;  Notable for the following:       Result Value   Hemoglobin 10.9 (*)    Neutro Abs 8.4 (*)    All other components within normal limits  COMPREHENSIVE METABOLIC PANEL - Abnormal; Notable for the following:    Sodium 130 (*)    Chloride 88 (*)    CO2 34 (*)    Glucose, Bld 174 (*)    Albumin 3.3 (*)    AST 44 (*)    All other components within normal limits  BRAIN NATRIURETIC PEPTIDE - Abnormal; Notable for the following:    B Natriuretic Peptide 635.4 (*)    All other components within normal limits  TROPONIN I - Abnormal; Notable for the following:    Troponin I 1.19 (*)    All other components within normal limits  TROPONIN I - Abnormal; Notable for the following:    Troponin I 1.07 (*)    All other components within normal limits  BASIC METABOLIC PANEL - Abnormal; Notable for the following:    Chloride 90 (*)    CO2 40 (*)    Glucose, Bld 126 (*)    All other components within normal limits  CBC - Abnormal; Notable for the following:    RBC 3.67 (*)    Hemoglobin 9.8 (*)    HCT 33.0 (*)    MCHC 29.7 (*)    All other components within normal limits  CBC - Abnormal; Notable for the following:    RBC 3.81 (*)    Hemoglobin 10.4 (*)    HCT 34.5 (*)    All other components within normal limits  D-DIMER, QUANTITATIVE (NOT AT [facility]) - Abnormal; Notable for the following:    D-Dimer, Quant 1.40 (*)    All other components within normal limits  GLUCOSE, CAPILLARY - Abnormal; Notable for the following:    Glucose-Capillary 151 (*)    All other components within normal limits  I-STAT CHEM 8, ED - Abnormal; Notable for the following:    Sodium 130 (*)    Chloride 85 (*)    Glucose, Bld 175 (*)    Calcium, Ion 1.08 (*)    All other components within normal limits  I-STAT TROPOININ, ED - Abnormal; Notable for the following:    Troponin i, poc 1.11 (*)    All other components within normal limits  I-STAT ARTERIAL BLOOD GAS, ED - Abnormal; Notable for the following:    pCO2  arterial 65.2 (*)  pO2, Arterial 137.0 (*)    Bicarbonate 39.4 (*)    Acid-Base Excess 12.0 (*)    All other components within normal limits  MRSA PCR SCREENING  CULTURE, BLOOD (ROUTINE X 2)  CULTURE, BLOOD (ROUTINE X 2)  CREATININE, SERUM  TROPONIN I  I-STAT CG4 LACTIC ACID, ED  I-STAT CG4 LACTIC ACID, ED    EKG  EKG Interpretation  Date/Time:  Sunday December 30 2015 18:32:41 EDT Ventricular Rate:  104 PR Interval:    QRS Duration: 86 QT Interval:  316 QTC Calculation: 416 R Axis:   -60 Text Interpretation:  Sinus tachycardia LAE, consider biatrial enlargement Left anterior fascicular block Abnormal R-wave progression, late transition Probable left ventricular hypertrophy Non-specific ST changes, likely demand No reciprocal cahnges in lateral leads Confirmed by Zetta Stoneman MD, Lysbeth Galas [phone]) on 12/30/2015 8:47:08 PM       Radiology Dg Chest Portable 1 View  Result Date: 12/30/2015 CLINICAL DATA:  Worsening shortness of breath today. History of pulmonary fibrosis. EXAM: PORTABLE CHEST 1 VIEW COMPARISON:  Chest CT 12/01/2015 FINDINGS: The cardiac silhouette, mediastinal and hilar contours are within normal limits and stable. There is mild tortuosity and calcification of the thoracic aorta. Stable severe chronic interstitial lung disease/ pulmonary fibrosis without definite acute overlying pulmonary process. IMPRESSION: Severe chronic lung disease but no definite acute overlying pulmonary findings. Electronically Signed   By: Marijo Sanes M.D.   On: 12/30/2015 18:28    Procedures Procedures (including critical care time)  Medications Ordered in ED Medications  albuterol (PROVENTIL) (2.5 MG/3ML) 0.083% nebulizer solution 2.5 mg (not administered)  mometasone-formoterol (DULERA) 200-5 MCG/ACT inhaler 2 puff (2 puffs Inhalation Given 12/31/15 0848)  ferrous sulfate tablet 325 mg (325 mg Oral Given 12/31/15 0846)  fluticasone (FLONASE) 50 MCG/ACT nasal spray 2 spray (not administered)    ALPRAZolam (XANAX) tablet 0.25 mg (0.25 mg Oral Given 12/31/15 0745)  clonazePAM (KLONOPIN) tablet 0.5 mg (not administered)  PARoxetine (PAXIL) tablet 10 mg (10 mg Oral Given 12/31/15 0850)  predniSONE (DELTASONE) tablet 3 mg (3 mg Oral Given 12/31/15 0849)  mycophenolate (CELLCEPT) capsule 500 mg (500 mg Oral Given 12/31/15 0845)  cholecalciferol (VITAMIN D) tablet 2,000 Units (2,000 Units Oral Given 12/31/15 0847)  pantoprazole (PROTONIX) EC tablet 40 mg (40 mg Oral Given 12/31/15 0846)  predniSONE (DELTASONE) tablet 5 mg (not administered)  simvastatin (ZOCOR) tablet 20 mg (20 mg Oral Given 12/31/15 0847)  acetaminophen (TYLENOL) tablet 650 mg (not administered)    Or  acetaminophen (TYLENOL) suppository 650 mg (not administered)  ondansetron (ZOFRAN) tablet 4 mg (not administered)    Or  ondansetron (ZOFRAN) injection 4 mg (not administered)  insulin aspart (novoLOG) injection 0-9 Units (2 Units Subcutaneous Given 12/31/15 0846)  enoxaparin (LOVENOX) injection 30 mg (30 mg Subcutaneous Given 12/31/15 0847)  aspirin tablet 325 mg (325 mg Oral Given 12/31/15 0847)  morphine CONCENTRATE 10 MG/0.5ML oral solution 1 mg (not administered)  LORazepam (ATIVAN) tablet 0.5 mg (0.5 mg Sublingual Given 12/31/15 1041)  oxymetazoline (AFRIN) 0.05 % nasal spray 2 spray (2 sprays Each Nare Given 12/30/15 1757)  aspirin chewable tablet 324 mg (324 mg Oral Given 12/30/15 1948)  furosemide (LASIX) injection 20 mg (20 mg Intravenous Given 12/30/15 1959)  furosemide (LASIX) injection 20 mg (20 mg Intravenous Given 12/31/15 0859)     Initial Impression / Assessment and Plan / ED Course  I have reviewed the triage vital signs and the nursing notes.  Pertinent labs & imaging results that were available during my  care of the patient were reviewed by me and considered in my medical decision making (see chart for details).  Clinical Course   82 yo F with extensive PMHx including chronic, end-stage ILD 2/2 RA-related pulmonary  fibrosis, on 10-15L at baseline, who presents with acute on chronic SOB. On arrival, pt tachycardic, tachypneic, in obvious respiratory distress, requiring NRB for O2 Sats >90%. Suspect acute on chronic dyspnea 2/2 ILD, with strong component of anxiety/air hunger likely 2/2 nasal congestion and hypoxia. Pt DNR/DNI but would otherwise like full supportive measures. Will continue supplemental O2, obtain stat labs, and blood gas. DDx includes ACS, PE ,PNA. Family does not wish for pt to undergo CT scans re: possible PE, and at this time I do no think pt could tolerate given her WOB, dyspnea. Otherwise, EKG shows sinus tachycardia with non-specific changes, but no acute ST elevations.  Labs, imaging as above. Pt has marked improvement in dyspnea following administration of Afrin and removal of large, thick secretions from left nares. CXR shows no focal PNA or PTX. CBC shows no leukocytosis or anemia. CMP at baseline. BNP elevated at 635 and trop 1.11. I discussed troponin, EKG with Dr. Radford Pax of cardiology - no signs of STEMI. Wll give ASA., monitor. Pt would not desire intervention. Otherwise, I remain concerned for PE but pt declines CT. I had a long discussion re: goals of care with son (POA) and daughter in [employer] [place]. DIL desires full hospice care but son seems resistant to this at this time. Pt and son would like to be admitted for symptom control. Will admit to Step Down. IV lasix given for possible CHF component, known pulmonary HTN.  Final Clinical Impressions(s) / ED Diagnoses   Final diagnoses:  SOB (shortness of breath)  ILD (interstitial lung disease) ([place])  Acute on chronic respiratory failure with hypoxia ([facility])      Duffy Bruce, MD 12/31/15 1109

## 2015-12-30 NOTE — H&P (Signed)
History and Physical    Donna Watts CLT:[id number] DOB: Jul 04, 1929 DOA: 12/30/2015  PCP: Henrine Screws, MD  Patient coming from: Home.  Chief Complaint: Shortness of breath.  HPI: Donna Watts is a 82 y.o. female with chronic respiratory failure with 10 L home oxygen secondary to interstitial lung disease who was recently placed on treprostinil which patient has stopped taking for last 3 days since it did not improve her breathing was brought to the ER after patient was having worsening shortness of breath over the last 3 days. Patient also noticed increasing swelling of the lower extremities. Chest x-ray in the ER shows chronic findings. Since patient has lower extremity edema Lasix 20 mg IV was given. Labs show mildly elevated troponin. Patient denies any chest pain. Patient is being admitted for further workup of acute respiratory failure. Patient at this time is requiring 15 L of oxygen to maintain sats. Patient denies any chest pain nausea vomiting abdominal pain or diarrhea.  ED Course: Lasix 20 mg IV was given.  Review of Systems: As per HPI, rest all negative.   Past Medical History:  Diagnosis Date  . Acid reflux   . Cancer ([place])    precancerous skin  . Diabetes mellitus without complication ([place])   . Esophageal reflux   . Other and unspecified hyperlipidemia   . Pulmonary fibrosis ([place])   . Rheumatoid arthritis [facility])     Past Surgical History:  Procedure Laterality Date  . APPENDECTOMY  1960's     reports that she has never smoked. She has never used smokeless tobacco. She reports that she does not drink alcohol or use drugs.  Allergies  Allergen Reactions  . Tramadol Rash and Hypertension    Dizziness     Family History  Problem Relation Age of Onset  . Heart disease Brother     Prior to Admission medications   Medication Sig Start Date End Date Taking? Authorizing Provider  albuterol (PROVENTIL HFA;VENTOLIN HFA) 108 (90 Base) MCG/ACT inhaler Inhale  2 puffs into the lungs every 6 (six) hours as needed for wheezing or shortness of breath.   Yes Historical Provider, MD  ALPRAZolam Duanne Moron) 0.25 MG tablet Take 0.25 mg by mouth as needed for anxiety.  08/27/15  Yes Historical Provider, MD  aspirin EC 81 MG tablet Take 81 mg by mouth every morning.    Yes Historical Provider, MD  budesonide-formoterol (SYMBICORT) 160-4.5 MCG/ACT inhaler Inhale 2 puffs into the lungs at bedtime as needed (for coughing).    Yes Historical Provider, MD  Cholecalciferol (VITAMIN D) 2000 UNITS CAPS Take 2,000 Units by mouth daily.    Yes Historical Provider, MD  clonazePAM (KLONOPIN) 0.5 MG tablet Take 0.5 mg by mouth at bedtime as needed for anxiety.  09/18/15  Yes Historical Provider, MD  ferrous sulfate 325 (65 FE) MG tablet Take 325 mg by mouth daily with breakfast.   Yes Historical Provider, MD  fluticasone (FLONASE) 50 MCG/ACT nasal spray Place 2 sprays into both nostrils daily as needed for allergies or rhinitis.   Yes Historical Provider, MD  guaifenesin (ROBITUSSIN) 100 MG/5ML syrup Take 200 mg by mouth 3 (three) times daily as needed for cough.   Yes Historical Provider, MD  ibuprofen (ADVIL,MOTRIN) 200 MG tablet Take 600 mg by mouth every 6 (six) hours as needed (For pain.). Reported on 10/25/2015   Yes Historical Provider, MD  metFORMIN (GLUCOPHAGE-XR) 500 MG 24 hr tablet Take 500 mg by mouth 2 (two) times daily.  Yes Historical Provider, MD  Multiple Vitamins-Minerals (CENTRUM SILVER PO) Take 1 tablet by mouth daily.     Yes Historical Provider, MD  mycophenolate (CELLCEPT) 500 MG tablet Take 500 mg by mouth 2 (two) times daily.    Yes Historical Provider, MD  omeprazole (PRILOSEC) 20 MG capsule Take 20 mg by mouth daily as needed (For heartburn or acid reflux.).    Yes Historical Provider, MD  PARoxetine (PAXIL) 10 MG tablet Take 10 mg by mouth every morning.  09/18/15  Yes Historical Provider, MD  predniSONE (DELTASONE) 1 MG tablet Take 3 mg by mouth daily with  breakfast.  10/17/14  Yes Historical Provider, MD  predniSONE (DELTASONE) 5 MG tablet Take 5 mg by mouth at bedtime.    Yes Historical Provider, MD  simvastatin (ZOCOR) 20 MG tablet Take 20 mg by mouth every morning.    Yes Historical Provider, MD    Physical Exam: Vitals:   12/30/15 2100 12/30/15 2130 12/30/15 2200 12/30/15 2230  BP: 95/59 126/64 112/62 121/72  Pulse: 89 102 99 98  Resp: (!) 36 (!) 36 (!) 39 (!) 46  Temp:      TempSrc:      SpO2: 97% 99% 99% 96%      Constitutional: Patient appears mildly short of breath. Vitals:   12/30/15 2100 12/30/15 2130 12/30/15 2200 12/30/15 2230  BP: 95/59 126/64 112/62 121/72  Pulse: 89 102 99 98  Resp: (!) 36 (!) 36 (!) 39 (!) 46  Temp:      TempSrc:      SpO2: 97% 99% 99% 96%   Eyes: Anicteric. no pallor. ENMT: No discharge from the ears eyes nose or mouth. Neck: Elevated JVD. No mass felt. Respiratory: No rhonchi or crepitations. Cardiovascular: S1 and S2 heard. Abdomen: Soft nontender bowel sounds present. Musculoskeletal: Bilateral lower extremity edema. Skin: No rash. Neurologic: Alert awake oriented to time place and person. Moves all extremities. Psychiatric: Appears normal.   Labs on Admission: I have personally reviewed following labs and imaging studies  CBC:  Recent Labs Lab 12/30/15 1735 12/30/15 1800  WBC 10.5  --   NEUTROABS 8.4*  --   HGB 10.9* 12.9  HCT 36.2 38.0  MCV 91.0  --   PLT 244  --    Basic Metabolic Panel:  Recent Labs Lab 12/30/15 1735 12/30/15 1800  NA 130* 130*  K 4.4 4.3  CL 88* 85*  CO2 34*  --   GLUCOSE 174* 175*  BUN 13 17  CREATININE 0.48 0.50  CALCIUM 9.0  --    GFR: CrCl cannot be calculated (Unknown ideal weight.). Liver Function Tests:  Recent Labs Lab 12/30/15 1735  AST 44*  ALT 35  ALKPHOS 52  BILITOT 0.5  PROT 7.0  ALBUMIN 3.3*   No results for input(s): LIPASE, AMYLASE in the last 168 hours. No results for input(s): AMMONIA in the last 168  hours. Coagulation Profile: No results for input(s): INR, PROTIME in the last 168 hours. Cardiac Enzymes: No results for input(s): CKTOTAL, CKMB, CKMBINDEX, TROPONINI in the last 168 hours. BNP (last 3 results) No results for input(s): PROBNP in the last 8760 hours. HbA1C: No results for input(s): HGBA1C in the last 72 hours. CBG: No results for input(s): GLUCAP in the last 168 hours. Lipid Profile: No results for input(s): CHOL, HDL, LDLCALC, TRIG, CHOLHDL, LDLDIRECT in the last 72 hours. Thyroid Function Tests: No results for input(s): TSH, T4TOTAL, FREET4, T3FREE, THYROIDAB in the last 72 hours. Anemia Panel:  No results for input(s): VITAMINB12, FOLATE, FERRITIN, TIBC, IRON, RETICCTPCT in the last 72 hours. Urine analysis:    Component Value Date/Time   COLORURINE YELLOW 03/21/2013 [place] 03/21/2013 1333   LABSPEC 1.007 03/21/2013 1333   PHURINE 7.5 03/21/2013 1333   GLUCOSEU NEGATIVE 03/21/2013 1333   HGBUR NEGATIVE 03/21/2013 1333   BILIRUBINUR NEGATIVE 03/21/2013 1333   KETONESUR NEGATIVE 03/21/2013 1333   PROTEINUR NEGATIVE 03/21/2013 1333   UROBILINOGEN 0.2 03/21/2013 1333   NITRITE NEGATIVE 03/21/2013 1333   LEUKOCYTESUR TRACE (A) 03/21/2013 1333   Sepsis Labs: _0 (procalcitonin:4,lacticidven:4) )No results found for this or any previous visit (from the past 240 hour(s)).   Radiological Exams on Admission: Dg Chest Portable 1 View  Result Date: 12/30/2015 CLINICAL DATA:  Worsening shortness of breath today. History of pulmonary fibrosis. EXAM: PORTABLE CHEST 1 VIEW COMPARISON:  Chest CT 12/01/2015 FINDINGS: The cardiac silhouette, mediastinal and hilar contours are within normal limits and stable. There is mild tortuosity and calcification of the thoracic aorta. Stable severe chronic interstitial lung disease/ pulmonary fibrosis without definite acute overlying pulmonary process. IMPRESSION: Severe chronic lung disease but no definite acute  overlying pulmonary findings. Electronically Signed   By: Marijo Sanes M.D.   On: 12/30/2015 18:28    EKG: Independently reviewed. Normal sinus rhythm with nonspecific ST changes in the inferior leads.  Assessment/Plan Principal Problem:   Acute and chronic respiratory failure with hypoxia (HCC) Active Problems:   PAH (pulmonary artery hypertension) (HCC)   ILD (interstitial lung disease) (HCC)   SOB (shortness of breath)   Diabetes mellitus type 2 in nonobese ([place])    1. Acute on chronic respiratory failure with history of interstitial lung disease - probably a flareup of interstitial lung disease. Patient does have lower extremity edema for which patient was given Lasix 20 mg IV. Further doses to be based on response. Check d-dimer. Continue prednisone and inhalers. I have consulted pulmonary critical care. 2. Elevated troponin - probably a stress from #1. Cycle cardiac markers patient is on aspirin. Denies any chest pain. Check 2-D echo. Follow d-dimer. 3. Diabetes mellitus type 2 - hold metformin while inpatient. Patient is on sliding scale coverage. 4. Chronic anemia on iron supplements. 5. History of rheumatoid arthritis on CellCept. 6. Hyponatremia probably from mild fluid overload. Follow metabolic panel.   DVT prophylaxis: Lovenox. Code Status: DO NOT RESUSCITATE.  Family Communication: Discussed with patient.  Disposition Plan: To be determined.  Consults called: Pulmonary critical care and palliative care.  Admission status: Observation. Stepdown.    Rise Patience MD [facility] Pager [phone].  If 7PM-7AM, please contact night-coverage [email or web address] Password [email or web address]  12/30/2015, 11:03 PM

## 2015-12-31 ENCOUNTER — Encounter (HOSPITAL_COMMUNITY): Payer: Self-pay | Admitting: *Deleted

## 2015-12-31 DIAGNOSIS — Z789 Other specified health status: Secondary | ICD-10-CM

## 2015-12-31 DIAGNOSIS — I272 Other secondary pulmonary hypertension: Secondary | ICD-10-CM | POA: Diagnosis not present

## 2015-12-31 DIAGNOSIS — R0600 Dyspnea, unspecified: Secondary | ICD-10-CM | POA: Diagnosis not present

## 2015-12-31 DIAGNOSIS — J8410 Pulmonary fibrosis, unspecified: Secondary | ICD-10-CM | POA: Diagnosis not present

## 2015-12-31 DIAGNOSIS — E119 Type 2 diabetes mellitus without complications: Secondary | ICD-10-CM | POA: Diagnosis not present

## 2015-12-31 DIAGNOSIS — Z515 Encounter for palliative care: Secondary | ICD-10-CM

## 2015-12-31 DIAGNOSIS — F411 Generalized anxiety disorder: Secondary | ICD-10-CM

## 2015-12-31 DIAGNOSIS — J849 Interstitial pulmonary disease, unspecified: Secondary | ICD-10-CM | POA: Diagnosis not present

## 2015-12-31 DIAGNOSIS — Z7189 Other specified counseling: Secondary | ICD-10-CM

## 2015-12-31 DIAGNOSIS — J84112 Idiopathic pulmonary fibrosis: Secondary | ICD-10-CM | POA: Diagnosis not present

## 2015-12-31 DIAGNOSIS — R0602 Shortness of breath: Secondary | ICD-10-CM

## 2015-12-31 DIAGNOSIS — J9621 Acute and chronic respiratory failure with hypoxia: Secondary | ICD-10-CM | POA: Diagnosis not present

## 2015-12-31 LAB — TROPONIN I
TROPONIN I: 0.93 ng/mL — AB (ref <0.03)
Troponin I: 1.07 ng/mL (ref <0.03)

## 2015-12-31 LAB — CBC
HCT: 33 % — ABNORMAL LOW (ref 36.0–46.0)
Hemoglobin: 9.8 g/dL — ABNORMAL LOW (ref 12.0–15.0)
MCH: 26.7 pg (ref 26.0–34.0)
MCHC: 29.7 g/dL — ABNORMAL LOW (ref 30.0–36.0)
MCV: 89.9 fL (ref 78.0–100.0)
PLATELETS: 231 10*3/uL (ref 150–400)
RBC: 3.67 MIL/uL — AB (ref 3.87–5.11)
RDW: 14.1 % (ref 11.5–15.5)
WBC: 10 10*3/uL (ref 4.0–10.5)

## 2015-12-31 LAB — GLUCOSE, CAPILLARY
GLUCOSE-CAPILLARY: 151 mg/dL — AB (ref 65–99)
Glucose-Capillary: 136 mg/dL — ABNORMAL HIGH (ref 65–99)

## 2015-12-31 LAB — MRSA PCR SCREENING: MRSA BY PCR: NEGATIVE

## 2015-12-31 LAB — BASIC METABOLIC PANEL
Anion gap: 6 (ref 5–15)
BUN: 8 mg/dL (ref 6–20)
CALCIUM: 8.9 mg/dL (ref 8.9–10.3)
CHLORIDE: 90 mmol/L — AB (ref 101–111)
CO2: 40 mmol/L — ABNORMAL HIGH (ref 22–32)
CREATININE: 0.44 mg/dL (ref 0.44–1.00)
Glucose, Bld: 126 mg/dL — ABNORMAL HIGH (ref 65–99)
Potassium: 4.3 mmol/L (ref 3.5–5.1)
SODIUM: 136 mmol/L (ref 135–145)

## 2015-12-31 MED ORDER — ALPRAZOLAM 0.25 MG PO TABS: 0.2500 mg | ORAL_TABLET | ORAL | 0 refills | Status: AC | PRN

## 2015-12-31 MED ORDER — LORAZEPAM 0.5 MG PO TABS
0.5000 mg | ORAL_TABLET | ORAL | Status: DC | PRN
  Administered 2015-12-31: 0.5 mg via SUBLINGUAL
  Filled 2015-12-31: qty 1

## 2015-12-31 MED ORDER — ASPIRIN 325 MG PO TABS
325.0000 mg | ORAL_TABLET | Freq: Every day | ORAL | Status: DC
  Administered 2015-12-31: 325 mg via ORAL
  Filled 2015-12-31: qty 1

## 2015-12-31 MED ORDER — MORPHINE SULFATE (CONCENTRATE) 10 MG /0.5 ML PO SOLN: 5.0000 mg | ORAL | 0 refills | Status: AC | PRN

## 2015-12-31 MED ORDER — MORPHINE SULFATE (CONCENTRATE) 10 MG/0.5ML PO SOLN: 1.0000 mg | ORAL | Status: DC | PRN

## 2015-12-31 MED ORDER — FUROSEMIDE 10 MG/ML IJ SOLN
20.0000 mg | Freq: Once | INTRAMUSCULAR | Status: AC
  Administered 2015-12-31: 20 mg via INTRAVENOUS
  Filled 2015-12-31: qty 2

## 2015-12-31 NOTE — Discharge Summary (Addendum)
Physician Discharge Summary  Donna Watts ZOX:[id number] DOB: [birth year] DOA: 12/30/2015  PCP: Donna Screws, MD  Admit date: 12/30/2015 Discharge date: 12/31/2015  Admitted From: Home Disposition:  Home with hospice  Recommendations for Outpatient Follow-up:  1. Follow up with PCP in 1-2 weeks 2. Please obtain BMP/CBC in one week. 3. Hospice to follow pt at home.  Home Health: Hospice Equipment/Devices: On oxygen high flow nasal cannula  Discharge Condition: Hospice CODE STATUS: DO NOT RESUSCITATE Diet recommendation: Heart Healthy  Brief/Interim Summary: Donna Watts is a 82 y.o. female with chronic respiratory failure with 10 L home oxygen secondary to interstitial lung disease who was recently placed on treprostinil which patient has stopped taking for last 3 days since it did not improve her breathing was brought to the ER after patient was having worsening shortness of breath over the last 3 days. Patient also noticed increasing swelling of the lower extremities. Chest x-ray in the ER shows chronic findings. Since patient has lower extremity edema Lasix 20 mg IV was given. Labs show mildly elevated troponin. Patient denies any chest pain. Patient is being admitted for further workup of acute respiratory failure. Patient at this time is requiring 15 L of oxygen to maintain sats. Patient denies any chest pain nausea vomiting abdominal pain or diarrhea.  Discharge Diagnoses:  Principal Problem:   Acute and chronic respiratory failure with hypoxia (HCC) Active Problems:   PAH (pulmonary artery hypertension) (HCC)   ILD (interstitial lung disease) (HCC)   SOB (shortness of breath)   Diabetes mellitus type 2 in nonobese (HCC)   Acute on chronic respiratory failure with history of interstitial lung disease - probably a flareup of interstitial lung disease. -Patient does have lower extremity edema for which patient was given Lasix 20 mg IV. Further doses to be based on response.  -Prednisone and her inhaler can continued, PCCM consulted and reported this is might be worsening of her disease. -Patient is on high flow nasal cannula continued. -I spoke with her daughter-in-law who is a hospice nurse and reported she is back to her baseline.  Elevated d-dimer -Slightly elevated at 1.4, patient is chronically hypoxic secondary to interstitial lung disease. -I doubt this is PE, no chest pain, this is discussed with her daughter-in-law Donna Watts, and requested no further workup.  Elevated troponin  -Pobably a stress from #1. Cycle cardiac markers patient is on aspirin.  -Denies any chest pain, 2-D echo ordered but her daughter-in-law Donna Watts requested to be canceled as she is going to hospice.  Diabetes mellitus type 2 - hold metformin while inpatient. Patient is on sliding scale coverage.  Chronic anemia on iron supplements.  History of rheumatoid arthritis on CellCept.  Hyponatremia probably from mild fluid overload. Follow metabolic panel.  Palliative/goals of care -Patient seen by palliative, met with the family (daughter-in-law Donna Watts is a hospice RN hand son Donna Watts is cardiology PA). -Family wants take her to hospice, I wanted to keep patient overnight but they requested discharge today if possible. -Per daughter-in-law at bedside she is back to her baseline.  Discharge Instructions  Discharge Instructions    Diet - low sodium heart healthy    Complete by:  As directed   Increase activity slowly    Complete by:  As directed       Medication List    TAKE these medications   albuterol 108 (90 Base) MCG/ACT inhaler Commonly known as:  PROVENTIL HFA;VENTOLIN HFA Inhale 2 puffs into the lungs every 6 (six) hours as needed  for wheezing or shortness of breath.   ALPRAZolam 0.25 MG tablet Commonly known as:  XANAX Take 1 tablet (0.25 mg total) by mouth as needed for anxiety.   aspirin EC 81 MG tablet Take 81 mg by mouth every morning.   budesonide-formoterol  160-4.5 MCG/ACT inhaler Commonly known as:  SYMBICORT Inhale 2 puffs into the lungs at bedtime as needed (for coughing).   CENTRUM SILVER PO Take 1 tablet by mouth daily.   clonazePAM 0.5 MG tablet Commonly known as:  KLONOPIN Take 0.5 mg by mouth at bedtime as needed for anxiety.   ferrous sulfate 325 (65 FE) MG tablet Take 325 mg by mouth daily with breakfast.   fluticasone 50 MCG/ACT nasal spray Commonly known as:  FLONASE Place 2 sprays into both nostrils daily as needed for allergies or rhinitis.   guaifenesin 100 MG/5ML syrup Commonly known as:  ROBITUSSIN Take 200 mg by mouth 3 (three) times daily as needed for cough.   ibuprofen 200 MG tablet Commonly known as:  ADVIL,MOTRIN Take 600 mg by mouth every 6 (six) hours as needed (For pain.). Reported on 10/25/2015   metFORMIN 500 MG 24 hr tablet Commonly known as:  GLUCOPHAGE-XR Take 500 mg by mouth 2 (two) times daily.   morphine CONCENTRATE 10 mg / 0.5 ml concentrated solution Take 0.25 mLs (5 mg total) by mouth every 4 (four) hours as needed for severe pain or shortness of breath.   mycophenolate 500 MG tablet Commonly known as:  CELLCEPT Take 500 mg by mouth 2 (two) times daily.   omeprazole 20 MG capsule Commonly known as:  PRILOSEC Take 20 mg by mouth daily as needed (For heartburn or acid reflux.).   PARoxetine 10 MG tablet Commonly known as:  PAXIL Take 10 mg by mouth every morning.   predniSONE 1 MG tablet Commonly known as:  DELTASONE Take 3 mg by mouth daily with breakfast.   predniSONE 5 MG tablet Commonly known as:  DELTASONE Take 5 mg by mouth at bedtime.   simvastatin 20 MG tablet Commonly known as:  ZOCOR Take 20 mg by mouth every morning.   Vitamin D 2000 units Caps Take 2,000 Units by mouth daily.       Allergies  Allergen Reactions  . Tramadol Rash and Hypertension    Dizziness     Consultations:  PCCM.  Palliative medicine   Procedures/Studies: Ct Abdomen Pelvis W  Contrast  Result Date: 12/01/2015 CLINICAL DATA:  82 year old female with respiratory failure. History of interstitial lung disease. Epigastric abdominal pain. EXAM: CT CHEST WITHOUT CONTRAST, AND CT ABDOMEN AND PELVIS WITH CONTRAST TECHNIQUE: Multidetector CT imaging of the chest was performed following the standard protocol without intravenous contrast. High resolution imaging of the lungs, as well as inspiratory and expiratory imaging, was performed. Multidetector CT imaging of the abdomen and pelvis was performed following the standard protocol during bolus administration of intravenous contrast. CONTRAST:  3m ISOVUE-300 IOPAMIDOL (ISOVUE-300) INJECTION 61% COMPARISON:  High-resolution chest CT 10/26/2015. CT the abdomen and pelvis 04/16/2014. FINDINGS: CT CHEST FINDINGS Cardiovascular: Heart size is normal. There is no significant pericardial fluid, thickening or pericardial calcification. There is aortic atherosclerosis, as well as atherosclerosis of the great vessels of the mediastinum and the coronary arteries, including calcified atherosclerotic plaque in the left main, left anterior descending, left circumflex and coronary arteries. Calcifications of the aortic valve. Mediastinum/Nodes: No pathologically enlarged mediastinal or hilar lymph nodes. Multiple densely calcified right hilar and mediastinal lymph nodes are incidentally noted. Esophagus  is unremarkable in appearance. No axillary lymphadenopathy. Lungs/Pleura: Again noted is extensive subpleural reticulation, parenchymal banding, traction bronchiectasis and frank honeycombing. Findings are essentially unchanged compared to prior study from 10/09/2015, and are again considered diagnostic of usual interstitial pneumonia (UIP) from an imaging perspective. No acute consolidative airspace disease. No pleural effusions. Musculoskeletal: There are no aggressive appearing lytic or blastic lesions noted in the visualized portions of the skeleton. CT  ABDOMEN AND PELVIS FINDINGS Hepatobiliary: Sub cm low-attenuation lesion in segment 4A of the liver is too small to definitively characterize, but is statistically likely a tiny cyst. No other suspicious hepatic lesions are noted. No intra or extrahepatic biliary ductal dilatation. Gallbladder is normal in appearance. Pancreas: No pancreatic mass. No pancreatic ductal dilatation. No pancreatic or peripancreatic fluid or inflammatory changes. Spleen: Unremarkable. Adrenals/Urinary Tract: A 2.1 cm simple cyst in the interpolar region of the right Watts. Sub cm low-attenuation lesion in the interpolar region of the left Watts is too small to characterize, but is statistically likely a tiny cyst. Bilateral adrenal glands are normal in appearance. No hydroureteronephrosis. Urinary bladder is normal in appearance. Stomach/Bowel: The appearance of the stomach is normal. There is no pathologic dilatation of small bowel or colon. Numerous colonic diverticulae are noted, without surrounding inflammatory changes to suggest an acute diverticulitis at this time. Vascular/Lymphatic: Aortic atherosclerosis, without evidence of aneurysm or dissection in the abdominal or pelvic vasculature. No lymphadenopathy noted in the abdomen or pelvis. Reproductive: Uterus and ovaries are atrophic. Other: No significant volume of ascites.  No pneumoperitoneum. Musculoskeletal: Severe dextroscoliosis of the lumbar spine convex to the right at the level of L2. Multilevel degenerative disc disease and lumbar spondylosis. There are no aggressive appearing lytic or blastic lesions noted in the visualized portions of the skeleton. IMPRESSION: 1. No significant change in the appearance of the chest compared to recent prior high-resolution chest CT 10/26/2015, with imaging findings which remain compatible with usual interstitial pneumonia. 2. No acute findings in the abdomen or pelvis to account for the patient's symptoms. 3. Severe colonic  diverticulosis without evidence of acute diverticulitis at this time. 4. Aortic atherosclerosis, in addition to left main and 3 vessel coronary artery disease. 5. There are calcifications of the aortic valve. Echocardiographic correlation for evaluation of potential valvular dysfunction may be warranted if clinically indicated. 6. Additional incidental findings, as above. Electronically Signed   By: Vinnie Langton M.D.   On: 12/01/2015 21:03   Ct Chest High Resolution  Result Date: 12/01/2015 CLINICAL DATA:  82 year old female with respiratory failure. History of interstitial lung disease. Epigastric abdominal pain. EXAM: CT CHEST WITHOUT CONTRAST, AND CT ABDOMEN AND PELVIS WITH CONTRAST TECHNIQUE: Multidetector CT imaging of the chest was performed following the standard protocol without intravenous contrast. High resolution imaging of the lungs, as well as inspiratory and expiratory imaging, was performed. Multidetector CT imaging of the abdomen and pelvis was performed following the standard protocol during bolus administration of intravenous contrast. CONTRAST:  41m ISOVUE-300 IOPAMIDOL (ISOVUE-300) INJECTION 61% COMPARISON:  High-resolution chest CT 10/26/2015. CT the abdomen and pelvis 04/16/2014. FINDINGS: CT CHEST FINDINGS Cardiovascular: Heart size is normal. There is no significant pericardial fluid, thickening or pericardial calcification. There is aortic atherosclerosis, as well as atherosclerosis of the great vessels of the mediastinum and the coronary arteries, including calcified atherosclerotic plaque in the left main, left anterior descending, left circumflex and coronary arteries. Calcifications of the aortic valve. Mediastinum/Nodes: No pathologically enlarged mediastinal or hilar lymph nodes. Multiple densely calcified right hilar  and mediastinal lymph nodes are incidentally noted. Esophagus is unremarkable in appearance. No axillary lymphadenopathy. Lungs/Pleura: Again noted is extensive  subpleural reticulation, parenchymal banding, traction bronchiectasis and frank honeycombing. Findings are essentially unchanged compared to prior study from 10/09/2015, and are again considered diagnostic of usual interstitial pneumonia (UIP) from an imaging perspective. No acute consolidative airspace disease. No pleural effusions. Musculoskeletal: There are no aggressive appearing lytic or blastic lesions noted in the visualized portions of the skeleton. CT ABDOMEN AND PELVIS FINDINGS Hepatobiliary: Sub cm low-attenuation lesion in segment 4A of the liver is too small to definitively characterize, but is statistically likely a tiny cyst. No other suspicious hepatic lesions are noted. No intra or extrahepatic biliary ductal dilatation. Gallbladder is normal in appearance. Pancreas: No pancreatic mass. No pancreatic ductal dilatation. No pancreatic or peripancreatic fluid or inflammatory changes. Spleen: Unremarkable. Adrenals/Urinary Tract: A 2.1 cm simple cyst in the interpolar region of the right Watts. Sub cm low-attenuation lesion in the interpolar region of the left Watts is too small to characterize, but is statistically likely a tiny cyst. Bilateral adrenal glands are normal in appearance. No hydroureteronephrosis. Urinary bladder is normal in appearance. Stomach/Bowel: The appearance of the stomach is normal. There is no pathologic dilatation of small bowel or colon. Numerous colonic diverticulae are noted, without surrounding inflammatory changes to suggest an acute diverticulitis at this time. Vascular/Lymphatic: Aortic atherosclerosis, without evidence of aneurysm or dissection in the abdominal or pelvic vasculature. No lymphadenopathy noted in the abdomen or pelvis. Reproductive: Uterus and ovaries are atrophic. Other: No significant volume of ascites.  No pneumoperitoneum. Musculoskeletal: Severe dextroscoliosis of the lumbar spine convex to the right at the level of L2. Multilevel degenerative disc  disease and lumbar spondylosis. There are no aggressive appearing lytic or blastic lesions noted in the visualized portions of the skeleton. IMPRESSION: 1. No significant change in the appearance of the chest compared to recent prior high-resolution chest CT 10/26/2015, with imaging findings which remain compatible with usual interstitial pneumonia. 2. No acute findings in the abdomen or pelvis to account for the patient's symptoms. 3. Severe colonic diverticulosis without evidence of acute diverticulitis at this time. 4. Aortic atherosclerosis, in addition to left main and 3 vessel coronary artery disease. 5. There are calcifications of the aortic valve. Echocardiographic correlation for evaluation of potential valvular dysfunction may be warranted if clinically indicated. 6. Additional incidental findings, as above. Electronically Signed   By: Vinnie Langton M.D.   On: 12/01/2015 21:03   Dg Chest Portable 1 View  Result Date: 12/30/2015 CLINICAL DATA:  Worsening shortness of breath today. History of pulmonary fibrosis. EXAM: PORTABLE CHEST 1 VIEW COMPARISON:  Chest CT 12/01/2015 FINDINGS: The cardiac silhouette, mediastinal and hilar contours are within normal limits and stable. There is mild tortuosity and calcification of the thoracic aorta. Stable severe chronic interstitial lung disease/ pulmonary fibrosis without definite acute overlying pulmonary process. IMPRESSION: Severe chronic lung disease but no definite acute overlying pulmonary findings. Electronically Signed   By: Marijo Sanes M.D.   On: 12/30/2015 18:28   Dg Chest Portable 1 View  Result Date: 12/01/2015 CLINICAL DATA:  Shortness of breath. EXAM: PORTABLE CHEST 1 VIEW COMPARISON:  None. FINDINGS: No pneumothorax. Findings of UIP, right greater than left, again identified and similar in the interval given in difference in technique. No definitive acute abnormality or change. IMPRESSION: Findings of UIP with no definitive acute abnormality.  Electronically Signed   By: Dorise Bullion III M.D   On: 12/01/2015  17:40    (Echo, Carotid, EGD, Colonoscopy, ERCP)    Subjective:   Discharge Exam: Vitals:   12/31/15 0320 12/31/15 0722  BP: (!) 100/58 120/69  Pulse: 88 89  Resp: (!) 33 (!) 37  Temp: 97.7 F (36.5 C) 98.1 F (36.7 C)   Vitals:   12/30/15 2331 12/31/15 0320 12/31/15 0722 12/31/15 1007  BP: 128/72 (!) 100/58 120/69   Pulse: 94 88 89   Resp:  (!) 33 (!) 37   Temp: 97.3 F (36.3 C) 97.7 F (36.5 C) 98.1 F (36.7 C)   TempSrc: Oral Axillary Oral   SpO2: 100% 95% 99% 98%  Weight: 43.7 kg (96 lb 5.5 oz)     Height: _0  (1.473 m)       General: Pt is alert, awake, not in acute distress Cardiovascular: RRR, S1/S2 +, no rubs, no gallops Respiratory: CTA bilaterally, no wheezing, no rhonchi Abdominal: Soft, NT, ND, bowel sounds + Extremities: no edema, no cyanosis    The results of significant diagnostics from this hospitalization (including imaging, microbiology, ancillary and laboratory) are listed below for reference.     Microbiology: Recent Results (from the past 240 hour(s))  MRSA PCR Screening     Status: None   Collection Time: 12/31/15  1:47 AM  Result Value Ref Range Status   MRSA by PCR NEGATIVE NEGATIVE Final    Comment:        The GeneXpert MRSA Assay (FDA approved for NASAL specimens only), is one component of a comprehensive MRSA colonization surveillance program. It is not intended to diagnose MRSA infection nor to guide or monitor treatment for MRSA infections.      Labs: BNP (last 3 results)  Recent Labs  12/30/15 1735  BNP 403.4*   Basic Metabolic Panel:  Recent Labs Lab 12/30/15 1735 12/30/15 1800 12/30/15 2317 12/31/15 0508  NA 130* 130*  --  136  K 4.4 4.3  --  4.3  CL 88* 85*  --  90*  CO2 34*  --   --  40*  GLUCOSE 174* 175*  --  126*  BUN 13 17  --  8  CREATININE 0.48 0.50 0.49 0.44  CALCIUM 9.0  --   --  8.9   Liver Function Tests:  Recent  Labs Lab 12/30/15 1735  AST 44*  ALT 35  ALKPHOS 52  BILITOT 0.5  PROT 7.0  ALBUMIN 3.3*   No results for input(s): LIPASE, AMYLASE in the last 168 hours. No results for input(s): AMMONIA in the last 168 hours. CBC:  Recent Labs Lab 12/30/15 1735 12/30/15 1800 12/30/15 2317 12/31/15 0508  WBC 10.5  --  10.5 10.0  NEUTROABS 8.4*  --   --   --   HGB 10.9* 12.9 10.4* 9.8*  HCT 36.2 38.0 34.5* 33.0*  MCV 91.0  --  90.6 89.9  PLT 244  --  245 231   Cardiac Enzymes:  Recent Labs Lab 12/30/15 2317 12/31/15 0508  TROPONINI 1.19* 1.07*   BNP: Invalid input(s): POCBNP CBG:  Recent Labs Lab 12/31/15 0728  GLUCAP 151*   D-Dimer  Recent Labs  12/30/15 2317  DDIMER 1.40*   Hgb A1c No results for input(s): HGBA1C in the last 72 hours. Lipid Profile No results for input(s): CHOL, HDL, LDLCALC, TRIG, CHOLHDL, LDLDIRECT in the last 72 hours. Thyroid function studies No results for input(s): TSH, T4TOTAL, T3FREE, THYROIDAB in the last 72 hours.  Invalid input(s): FREET3 Anemia work up No results for  input(s): VITAMINB12, FOLATE, FERRITIN, TIBC, IRON, RETICCTPCT in the last 72 hours. Urinalysis    Component Value Date/Time   COLORURINE YELLOW 03/21/2013 1333   APPEARANCEUR CLEAR 03/21/2013 1333   LABSPEC 1.007 03/21/2013 1333   PHURINE 7.5 03/21/2013 1333   GLUCOSEU NEGATIVE 03/21/2013 1333   HGBUR NEGATIVE 03/21/2013 1333   BILIRUBINUR NEGATIVE 03/21/2013 1333   KETONESUR NEGATIVE 03/21/2013 1333   PROTEINUR NEGATIVE 03/21/2013 1333   UROBILINOGEN 0.2 03/21/2013 1333   NITRITE NEGATIVE 03/21/2013 1333   LEUKOCYTESUR TRACE (A) 03/21/2013 1333   Sepsis Labs Invalid input(s): PROCALCITONIN,  WBC,  LACTICIDVEN Microbiology Recent Results (from the past 240 hour(s))  MRSA PCR Screening     Status: None   Collection Time: 12/31/15  1:47 AM  Result Value Ref Range Status   MRSA by PCR NEGATIVE NEGATIVE Final    Comment:        The GeneXpert MRSA Assay  (FDA approved for NASAL specimens only), is one component of a comprehensive MRSA colonization surveillance program. It is not intended to diagnose MRSA infection nor to guide or monitor treatment for MRSA infections.      Time coordinating discharge: Over 30 minutes  SIGNED:   Birdie Hopes, MD  [facility] 12/31/2015, 10:46 AM Pager   If 7PM-7AM, please contact night-coverage [email or web address] Password [email or web address]

## 2015-12-31 NOTE — Care Management Note (Addendum)
Case Management Note  Patient Details  Name: Donna Watts MRN: [id number] Date of Birth: 06-Feb-1930  Subjective/Objective:    Patient is for dc today home with hospice, NCM spoke with son in room , he states she was with [facility] and would like to resume with [facility]. NCM left message with [facility], awaiting a call back from on call rep to arrange Hospice services for resumption.  Daughter went home to get oxygen tank for patient, she will be transported by car at dc.  Patient is no longer taking the  Tyvaso medication and chooses to go back under Hospice care.  NCM received call from on call Hospice rep with [facility], she states they know patient's daughter Donna Watts who is a Merchandiser, retail,  They will be able to contact the patient tomorrow and will take care of referral tomorrow.  She states to fax the Hospice order and the face sheet to [phone].  NCM informed MD need order for Hospice RN. NCM faxed order, demographics to  [facility].               Action/Plan:   Expected Discharge Date:  01/02/16               Expected Discharge Plan:  Home w Hospice Care  In-House Referral:  Hospice / Palliative Care  Discharge planning Services  CM Consult  Post Acute Care Choice:  Hospice, Resumption of Svcs/PTA Provider Choice offered to:  Sibling  DME Arranged:    DME Agency:     HH Arranged:    [place]:  [facility] and [facility]  Status of Service:  Completed, signed off  If discussed at [place] of Stay Meetings, dates discussed:    Additional Comments:  Zenon Mayo, RN 12/31/2015, 11:24 AM

## 2015-12-31 NOTE — Consult Note (Signed)
Consultation Note Date: 12/31/2015   Patient Name: Donna Watts  DOB: 11/15/1929  MRN: [id number]  Age / Sex: 82 y.o., female  PCP: Donna Huddle, MD Referring Physician: Verlee Monte, MD  Reason for Consultation: Establishing goals of care and Non pain symptom management  HPI/Patient Profile: 82 y.o. female  with past medical history of interstitial lung disease, pulmonary fibrosis, pulmonary hypertension, secondary to rhematoid arthritis admitted on 12/30/2015 with respiratory failure.   Clinical Assessment and Goals of Care: Met with patient, son, Donna Watts and daughter in Sports coach, Donna Watts. Very pleasant 82 yo female with advanced pulmonary fibrosis and interstitial lung disease. Lives at home with her son, Donna Watts. Her other son, Donna Watts, and daughter in law, Donna Watts live next door and help provide care. 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 Donna Watts tells me that patient was previously a Hospice patient with [facility] but transitioned to Palliative care so that they could trial Tyvaso medication. However, the side effects of Tyvaso were intolerable and thus they stopped the Tyvaso and now desire to return home with Hospice care with the focus on quality of life. Family desires no further diagnostics or workup and feel ready to take patient home. Patient reports she feel great except for her dyspnea. This limits her ability to ambulate and to live independently as she was doing before and thus affects her quality of life. She also experiences anxiety and when the anxiety hits, her SOB increases, even at rest. [facility] requests a faster acting medication  She continues to eat well, but requires assistance with all ADL's at home. She does not have any pain.   NEXT OF KIN- sons- Donna Watts and Donna Watts    SUMMARY OF RECOMMENDATIONS -Home with hospice -Morphine intensol 32m SL q1h prn SOB- titrate with Hospice at home  -Lorazepam .555m SL q1hr prn anxiety -Discussed with GOC with Dr. ElHartford Polind he agreed to Watts ECHO as family requested no further diagnostics, discharge home today with Hospice support     Code Status/Advance Care Planning:  DNR    Symptom Management:   See summary of recommendations above  Palliative Prophylaxis:   frequent assessment for anxiety and dyspnea  Additional Recommendations (Limitations, Scope, Preferences):  Avoid Hospitalization, Full Comfort Care, Minimize Medications, No Artificial Feeding, No Blood Transfusions, No Chemotherapy, No Diagnostics, No Hemodialysis, No Lab Draws and No Radiation  Psycho-social/Spiritual:   Desire for further Chaplaincy support:No  Additional Recommendations: Education on Hospice  Prognosis:   < 6 months due to PPS: 20%, decreasing respiratory status, desire for comfort care only  Discharge Planning: Home with Hospice      Primary Diagnoses: Present on Admission: . SOB (shortness of breath) . PAH (pulmonary artery hypertension) ([facility]. ILD (interstitial lung disease) ([facility]. Acute and chronic respiratory failure with hypoxia ([facility]  I have reviewed the medical record, interviewed the patient and family, and examined the patient. The following aspects are pertinent.  Past Medical History:  Diagnosis Date  . Acid reflux   .  Cancer ([place])    precancerous skin  . Diabetes mellitus without complication ([place])   . Esophageal reflux   . Other and unspecified hyperlipidemia   . Pulmonary fibrosis ([place])   . Rheumatoid arthritis [facility])    Social History   Social History  . Marital status: Widowed    Spouse name: N/A  . Number of children: 3  . Years of education: 12   Occupational History  . Retired     na   Social History Main Topics  . Smoking status: Never Smoker  . Smokeless tobacco: Never Used  . Alcohol use No  . Drug use: No  . Sexual activity: Not Asked   Other Topics Concern  . None   Social History Narrative     Lives with son   Caffeine use- coffee- 2 cups daily   Family History  Problem Relation Age of Onset  . Heart disease Brother    Scheduled Meds: . aspirin  325 mg Oral Daily  . cholecalciferol  2,000 Units Oral Daily  . enoxaparin (LOVENOX) injection  30 mg Subcutaneous Daily  . ferrous sulfate  325 mg Oral Q breakfast  . insulin aspart  0-9 Units Subcutaneous TID WC  . mometasone-formoterol  2 puff Inhalation BID  . mycophenolate  500 mg Oral BID  . pantoprazole  40 mg Oral Daily  . PARoxetine  10 mg Oral Daily  . predniSONE  3 mg Oral Q breakfast  . predniSONE  5 mg Oral QHS  . simvastatin  20 mg Oral Daily   Continuous Infusions:  PRN Meds:.acetaminophen **OR** acetaminophen, albuterol, ALPRAZolam, clonazePAM, fluticasone, LORazepam, morphine CONCENTRATE, ondansetron **OR** ondansetron (ZOFRAN) IV Medications Prior to Admission:  Prior to Admission medications   Medication Sig Start Date End Date Taking? Authorizing Provider  albuterol (PROVENTIL HFA;VENTOLIN HFA) 108 (90 Base) MCG/ACT inhaler Inhale 2 puffs into the lungs every 6 (six) hours as needed for wheezing or shortness of breath.   Yes Historical Provider, MD  aspirin EC 81 MG tablet Take 81 mg by mouth every morning.    Yes Historical Provider, MD  budesonide-formoterol (SYMBICORT) 160-4.5 MCG/ACT inhaler Inhale 2 puffs into the lungs at bedtime as needed (for coughing).    Yes Historical Provider, MD  Cholecalciferol (VITAMIN D) 2000 UNITS CAPS Take 2,000 Units by mouth daily.    Yes Historical Provider, MD  clonazePAM (KLONOPIN) 0.5 MG tablet Take 0.5 mg by mouth at bedtime as needed for anxiety.  09/18/15  Yes Historical Provider, MD  ferrous sulfate 325 (65 FE) MG tablet Take 325 mg by mouth daily with breakfast.   Yes Historical Provider, MD  fluticasone (FLONASE) 50 MCG/ACT nasal spray Place 2 sprays into both nostrils daily as needed for allergies or rhinitis.   Yes Historical Provider, MD  guaifenesin  (ROBITUSSIN) 100 MG/5ML syrup Take 200 mg by mouth 3 (three) times daily as needed for cough.   Yes Historical Provider, MD  ibuprofen (ADVIL,MOTRIN) 200 MG tablet Take 600 mg by mouth every 6 (six) hours as needed (For pain.). Reported on 10/25/2015   Yes Historical Provider, MD  metFORMIN (GLUCOPHAGE-XR) 500 MG 24 hr tablet Take 500 mg by mouth 2 (two) times daily.    Yes Historical Provider, MD  Multiple Vitamins-Minerals (CENTRUM SILVER PO) Take 1 tablet by mouth daily.     Yes Historical Provider, MD  mycophenolate (CELLCEPT) 500 MG tablet Take 500 mg by mouth 2 (two) times daily.    Yes Historical Provider, MD  omeprazole (  PRILOSEC) 20 MG capsule Take 20 mg by mouth daily as needed (For heartburn or acid reflux.).    Yes Historical Provider, MD  PARoxetine (PAXIL) 10 MG tablet Take 10 mg by mouth every morning.  09/18/15  Yes Historical Provider, MD  predniSONE (DELTASONE) 1 MG tablet Take 3 mg by mouth daily with breakfast.  10/17/14  Yes Historical Provider, MD  predniSONE (DELTASONE) 5 MG tablet Take 5 mg by mouth at bedtime.    Yes Historical Provider, MD  simvastatin (ZOCOR) 20 MG tablet Take 20 mg by mouth every morning.    Yes Historical Provider, MD  ALPRAZolam (XANAX) 0.25 MG tablet Take 1 tablet (0.25 mg total) by mouth as needed for anxiety. 12/31/15   Donna Monte, MD  Morphine Sulfate (MORPHINE CONCENTRATE) 10 mg / 0.5 ml concentrated solution Take 0.25 mLs (5 mg total) by mouth every 4 (four) hours as needed for severe pain or shortness of breath. 12/31/15   Donna Monte, MD   Allergies  Allergen Reactions  . Tramadol Rash and Hypertension    Dizziness    Review of Systems  Constitutional: Positive for activity change and fatigue.  HENT: Negative.   Eyes: Negative.   Respiratory: Positive for shortness of breath.   Cardiovascular: Positive for leg swelling. Negative for chest pain.  Gastrointestinal: Negative.   Endocrine: Negative.   Genitourinary: Negative.     Musculoskeletal: Negative.   Allergic/Immunologic: Negative.   Neurological: Negative.   Hematological: Negative.   Psychiatric/Behavioral: Negative.     Physical Exam  Constitutional: She is oriented to person, place, and time. She appears well-developed and well-nourished.  Cardiovascular:  Tachycardic   Pulmonary/Chest: Tachypnea noted.  Abdominal: Soft. Bowel sounds are normal.  Musculoskeletal: Normal range of motion. She exhibits edema.  Neurological: She is alert and oriented to person, place, and time.  Skin: Skin is warm and dry.  Psychiatric: She has a normal mood and affect. Her behavior is normal. Judgment and thought content normal.    Vital Signs: BP 119/80 (BP Location: Left Arm)   Pulse (!) 105   Temp 97.8 F (36.6 C) (Oral)   Resp (!) 38   Ht _0  (1.473 m)   Wt 43.7 kg (96 lb 5.5 oz)   SpO2 95%   BMI 20.14 kg/m  Pain Assessment: No/denies pain POSS *See Group Information*: 1-Acceptable,Awake and alert     SpO2: SpO2: 95 % O2 Device:SpO2: 95 % O2 Flow Rate: .O2 Flow Rate (L/min): 10 L/min  IO: Intake/output summary:   Intake/Output Summary (Last 24 hours) at 12/31/15 1153 Last data filed at 12/31/15 1120  Gross per 24 hour  Intake              240 ml  Output             1650 ml  Net            -1410 ml    LBM:   Baseline Weight: Weight: 43.7 kg (96 lb 5.5 oz) Most recent weight: Weight: 43.7 kg (96 lb 5.5 oz)     Palliative Assessment/Data:   Flowsheet Rows   Flowsheet Row Most Recent Value  Intake Tab  Referral Department  -- [Internal [place]  Unit at Time of Referral  Intermediate Care Unit  Palliative Care Primary Diagnosis  Pulmonary  Date Notified  12/30/15  Palliative Care Type  New Palliative care  Reason for referral  Clarify Goals of Care, Non-pain Symptom  Date of Admission  12/30/15  Date first seen by Palliative Care  12/31/15  # of days Palliative referral response time  1 Day(s)  # of days IP prior to Palliative  referral  0  Clinical Assessment  Palliative Performance Scale Score  40%  Dyspnea Max Last 24 Hours  10  Dyspnea Min Last 24 hours  10  Psychosocial & Spiritual Assessment  Social Work Plan of Care  Participated in [place]  Patient/Family meeting held?  Yes  Who was at the meeting?  Patient, daughter in law, Donna Watts, son Donna Watts  Palliative Care Outcomes  Improved non-pain symptom therapy, Clarified goals of care, Transitioned to hospice, Changed to focus on comfort  Patient/Family wishes: Interventions discontinued/not started   Mechanical Ventilation, Hemodialysis, Transfusion, Vasopressors, Transfer out of ICU, Antibiotics, Tube feedings/TPN, Trach, PEG, NIPPV  Palliative Care follow-up planned  Yes, Home      Time In: 0900 Time Out:1030 Time Total: 90 minutes  Greater than 50%  of this time was spent counseling and coordinating care related to the above assessment and plan.  Signed by: Mariana Kaufman, AGNP-C Palliative Medicine    Please contact Palliative Medicine Team phone at [phone] for questions and concerns.  For individual provider: See Shea Evans

## 2015-12-31 NOTE — Care Management Obs Status (Signed)
[facility] NOTIFICATION   Patient Details  Name: Donna Watts MRN: [id number] Date of Birth: 1930/03/26   Medicare Observation Status Notification Given:  Yes    Zenon Mayo, RN 12/31/2015, 11:23 AM

## 2015-12-31 NOTE — Progress Notes (Signed)
Reviewed discharge instructions including new medications with pt's son and daughter in law. Case Manager, Neoma Laming, contacted hospice care and states they will be out tomorrow 9/5 to pt's home to readmit her to hospice care. Daughter in law is a Merchandiser, retail and states she feels comfortable having her at home. Pt discharged home in wheelchair with oxygen. Pt uses oxygen 8 to liters at home. Daughter in law has pt's home oxygen in car for transfer. Consuelo Pandy RN

## 2015-12-31 NOTE — Consult Note (Signed)
Pulmonary Consult Service  Patient name: Donna Watts Medical record number: [id number] Date of birth: [birth year] Age: 82 y.o. Gender: female PCP: Henrine Screws, MD  Date: 12/31/2015 Reason for Consult: Dyspnea Referring Physician: Verlee Monte, MD  HPI:  Donna Watts is an 82 y/o woman with a history of RA-associated ILD (UIP pattern) treated with cellcept and prednisone (previously on Imuran) and followed by Dr. Chase Caller who presents to the ED with worsening dyspnea and hypoxia. She has advanced disease and has mild pHTN associated with her ILD (mPAP of ~30) and was started on Tyvaso (inhaled treprostinil) in an attempt to improve her symptoms and potentially her hypoxia (her lung disease is quite heterogenous in its distribution, and an inhaled vasodilator therapy could potentially improve V/Q matching). She started this therapy in early August, and reports that she felt that it did improve her symptoms for a few days, but after about four days, she had significant side effects (fatigue, headache, dizziness). She continued it for a few weeks, but stopped a few days prior to admission. She then developed worsening lower extremity edema and worsened dyspnea and hypoxia, and presented to the ED. She was treated with lasix and admitted.  Past Medical History:  Diagnosis Date  . Acid reflux   . Cancer ([place])    precancerous skin  . Diabetes mellitus without complication ([place])   . Esophageal reflux   . Other and unspecified hyperlipidemia   . Pulmonary fibrosis ([place])   . Rheumatoid arthritis [facility])     Past Surgical History:  Procedure Laterality Date  . APPENDECTOMY  1960's    Family History  Problem Relation Age of Onset  . Heart disease Brother     Social History:  reports that she has never smoked. She has never used smokeless tobacco. She reports that she does not drink alcohol or use drugs.  Allergies:  Allergies  Allergen Reactions  . Tramadol Rash and Hypertension     Dizziness     Medications: I have reviewed the patient's current medications.  Pertinent items are noted in HPI.  Temp:  [97.3 F (36.3 C)-99 F (37.2 C)] 97.7 F (36.5 C) (09/04 0320) Pulse Rate:  [88-106] 88 (09/04 0320) Resp:  [23-46] 33 (09/04 0320) BP: (95-155)/(58-96) 100/58 (09/04 0320) SpO2:  [92 %-100 %] 95 % (09/04 0320) Weight:  [96 lb 5.5 oz (43.7 kg)] 96 lb 5.5 oz (43.7 kg) (09/03 2331)  Intake/Output Summary (Last 24 hours) at 12/31/15 0521 Last data filed at 12/31/15 0100  Gross per 24 hour  Intake                0 ml  Output              500 ml  Net             -500 ml   Physical exam Gen: Elderly woman in NAD HEENT: MMM CV: Reg Rate, rhythm, mild JVD to just above clavicles when sitting at ~60 degrees Pulm: Diffuse inspiratory crackles. Abd: Soft, nontender Extrem: Mild joint swelling of hands, feet, consistent with RA Skin: Mild ecchymosis scattered   LAB RESULTS BMET    Component Value Date/Time   NA 130 (L) 12/30/2015 1800   K 4.3 12/30/2015 1800   CL 85 (L) 12/30/2015 1800   CO2 34 (H) 12/30/2015 1735   GLUCOSE 175 (H) 12/30/2015 1800   BUN 17 12/30/2015 1800   CREATININE 0.49 12/30/2015 2317   CALCIUM 9.0 12/30/2015 1735   GFRNONAA >60  12/30/2015 2317   GFRAA >60 12/30/2015 2317   CBC    Component Value Date/Time   WBC 10.5 12/30/2015 2317   RBC 3.81 (L) 12/30/2015 2317   HGB 10.4 (L) 12/30/2015 2317   HCT 34.5 (L) 12/30/2015 2317   PLT 245 12/30/2015 2317   MCV 90.6 12/30/2015 2317   MCH 27.3 12/30/2015 2317   MCHC 30.1 12/30/2015 2317   RDW 13.9 12/30/2015 2317   LYMPHSABS 1.1 12/30/2015 1735   MONOABS 0.6 12/30/2015 1735   EOSABS 0.3 12/30/2015 1735   BASOSABS 0.0 12/30/2015 1735   ABG    Component Value Date/Time   PHART 7.391 12/30/2015 1835   HCO3 39.4 (H) 12/30/2015 1835   TCO2 41 12/30/2015 1835   O2SAT 99.0 12/30/2015 1835   Radiology: Chest films reviewed. Severe UIP. No definite changes on today's CXR, but  difficult to assess given severity of underlying disease. Assessment and plan:  RA- associated ILD with pulmonary hypertension: Continue home cellcept End-stage disease Consider hospice now that pt isn't going to be taking Tyvaso  Volume Overload: Given the rebound of stopping the pHTN med (Tyvaso), can anticipate some volume overload. Agree w/ lasix. Redose another 20 mg IV in the morning (ordered). May be worsening hypoxia.   Luz Brazen, MD Pulmonary & Critical Care Medicine December 31, 2015, 5:35 AM

## 2016-01-01 ENCOUNTER — Telehealth (HOSPITAL_COMMUNITY): Payer: Self-pay

## 2016-01-01 NOTE — Telephone Encounter (Signed)
Nurse with [facility] called CHF clinic triage to inform us patient was recently seen in ED and taken off Tyvaso to transition to hospice care. CHF providers made aware.  Renee Pain, RN

## 2016-01-04 LAB — CULTURE, BLOOD (ROUTINE X 2)
CULTURE: NO GROWTH
CULTURE: NO GROWTH

## 2016-01-11 ENCOUNTER — Telehealth: Payer: Self-pay | Admitting: Internal Medicine

## 2016-01-11 NOTE — Telephone Encounter (Signed)
Spoke with MR. There is no contraindication for pt to take mucinex.   Called made pt son aware. Nothing further needed

## 2016-01-15 ENCOUNTER — Telehealth: Payer: Self-pay | Admitting: Internal Medicine

## 2016-01-15 NOTE — Telephone Encounter (Signed)
Spoke with pt's hospice nurse, Ria Comment. The pt has Symbicort at home. Her family is wanting to know if this would have any benefit for her at this point. They don't want to keep giving this to her if it's going to help.  MR - please advise. Thanks.

## 2016-01-16 NOTE — Telephone Encounter (Signed)
Attempted to contact Ria Comment. No answer, voicemail is full. Will try back.

## 2016-01-16 NOTE — Telephone Encounter (Signed)
Ok to [employer]. Hospice can consider albuterol neb based on hospice RN/MD bedside judgement for dyspnea relief but agree no need for symbicort  Dr. Brand Males, M.D., [facility].C.P Pulmonary and Critical Care Medicine Staff Physician [place] [facility]monary and Critical Care Pager: [phone], If no answer or between  15:00h - 7:00h: call [phone]  01/16/2016 8:40 AM

## 2016-01-17 NOTE — Telephone Encounter (Signed)
Attempted to contact Donna Watts VM was left for her to call back to triage

## 2016-01-18 NOTE — Telephone Encounter (Signed)
Spoke with Ria Comment. She is aware of MR's recommendation. Nothing further was needed.

## 2016-01-30 ENCOUNTER — Ambulatory Visit: Payer: Medicare Other | Admitting: Internal Medicine

## 2016-02-27 DEATH — deceased

## 2016-11-19 NOTE — Progress Notes (Signed)
xxxx canceled visit

## 2018-01-06 IMAGING — CT CT CHEST HIGH RESOLUTION W/O CM
2 of 6 series · 12 of 36 positions shown, 15 images · IV contrast (iopamidol)
Comparison: High-resolution chest CT 10/26/2015. CT the abdomen and
pelvis 04/16/2014.

CLINICAL DATA: 86-year-old female with respiratory failure. History
of interstitial lung disease. Epigastric abdominal pain.

EXAM:
CT CHEST WITHOUT CONTRAST, AND CT ABDOMEN AND PELVIS WITH CONTRAST
TECHNIQUE: Multidetector CT imaging of the chest was performed following the
standard protocol without intravenous contrast. High resolution
imaging of the lungs, as well as inspiratory and expiratory imaging,
was performed.
Multidetector CT imaging of the abdomen and pelvis was performed
following the standard protocol during bolus administration of
intravenous contrast.
CONTRAST:  75mL [id number] IOPAMIDOL (WK0SUN-2HH) INJECTION 61%

[Series 201: hi res routine, idose (2) · axial · 0.61mm/px · z∈[+96,+343]mm · 9 of 109 slices shown, 12 images]
[im 7/109  mediastinal]
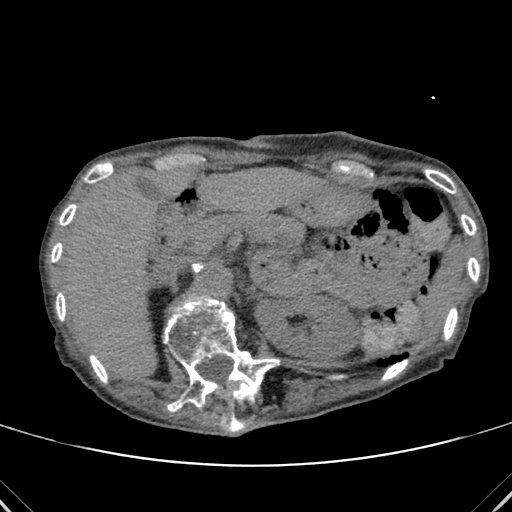
[im 7/109  lung]
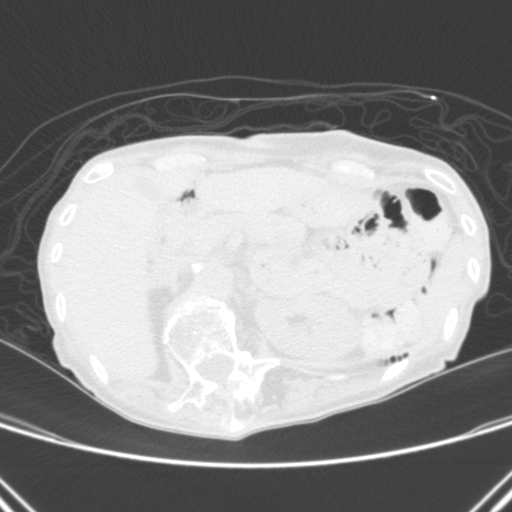
[im 19/109  lung]
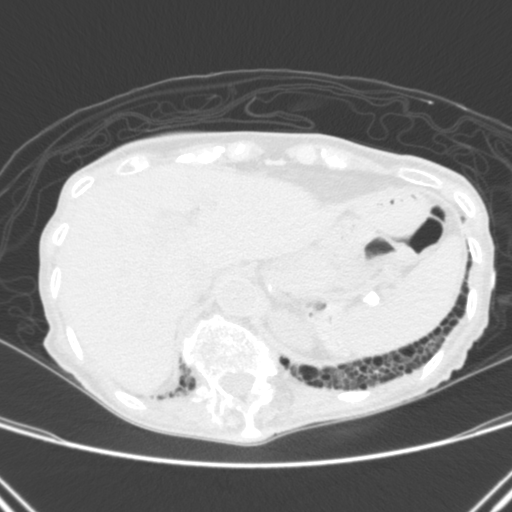
[im 31/109  lung]
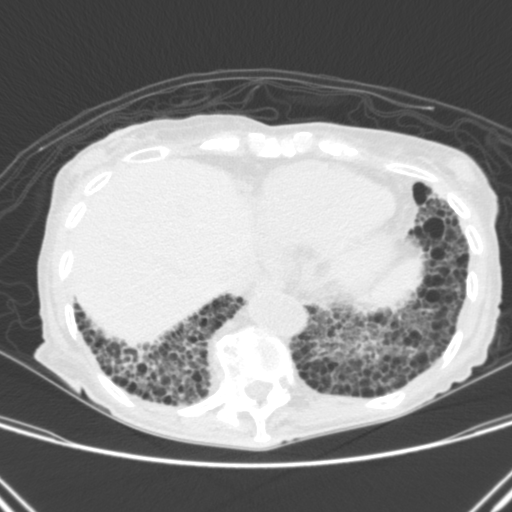
[im 43/109  lung]
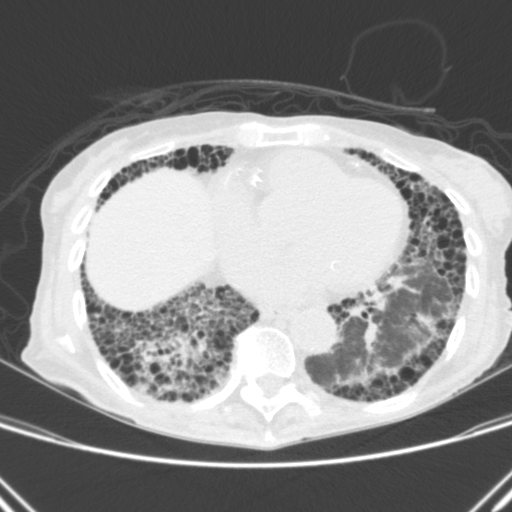
[im 55/109  mediastinal]
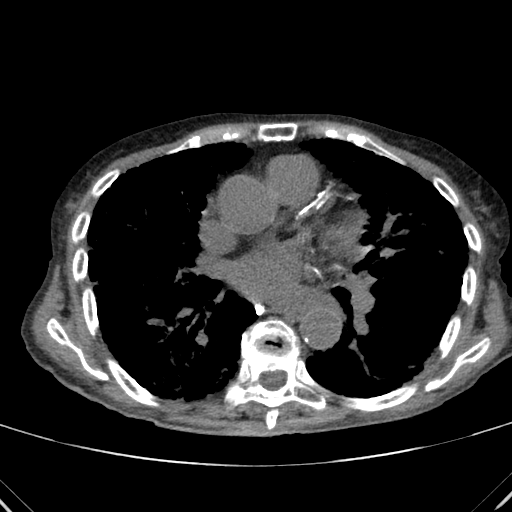
[im 55/109  lung]
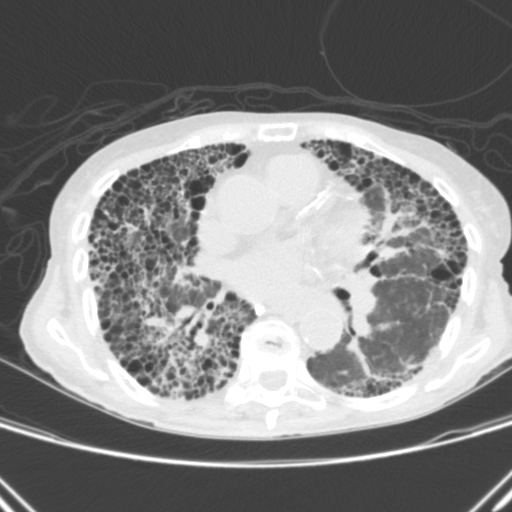
[im 67/109  lung]
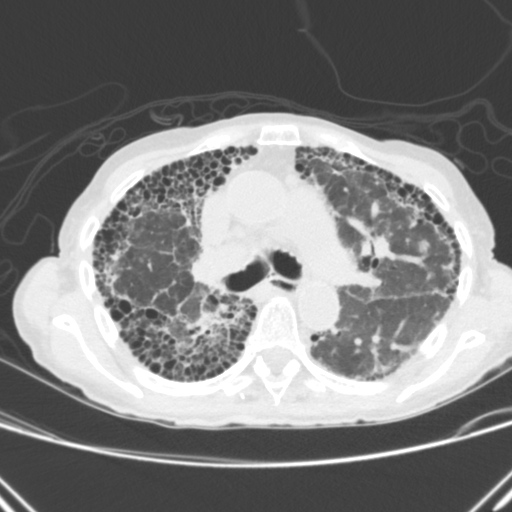
[im 79/109  lung]
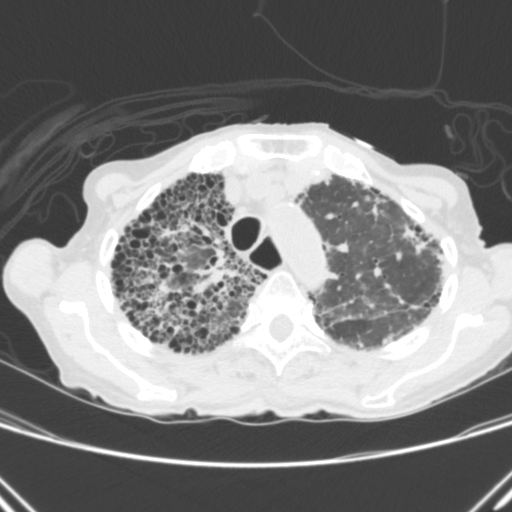
[im 91/109  lung]
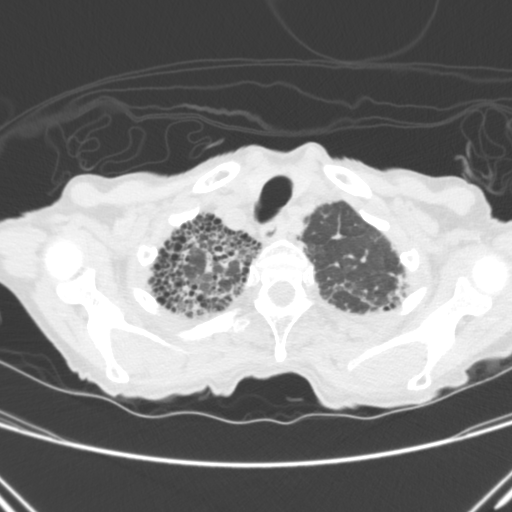
[im 103/109  mediastinal]
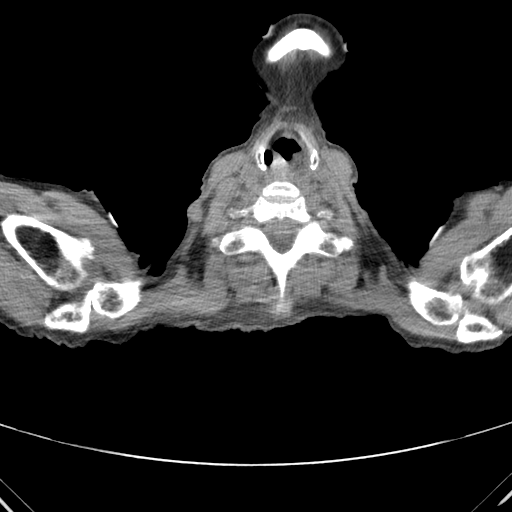
[im 103/109  lung]
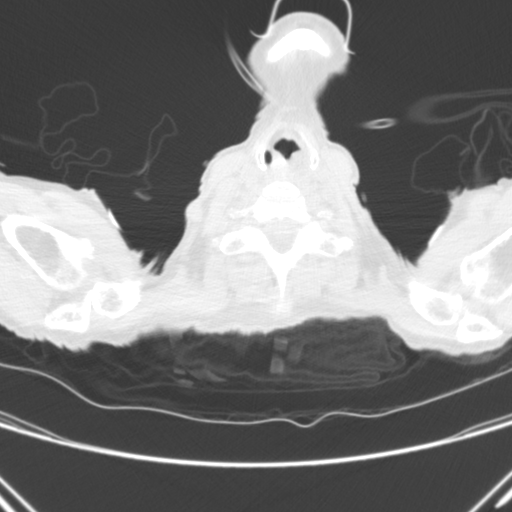

[Series 203: hi res coronal, idose (2) · coronal · 0.45mm/px · 3 of 119 slices shown]
[im 24/119  lung]
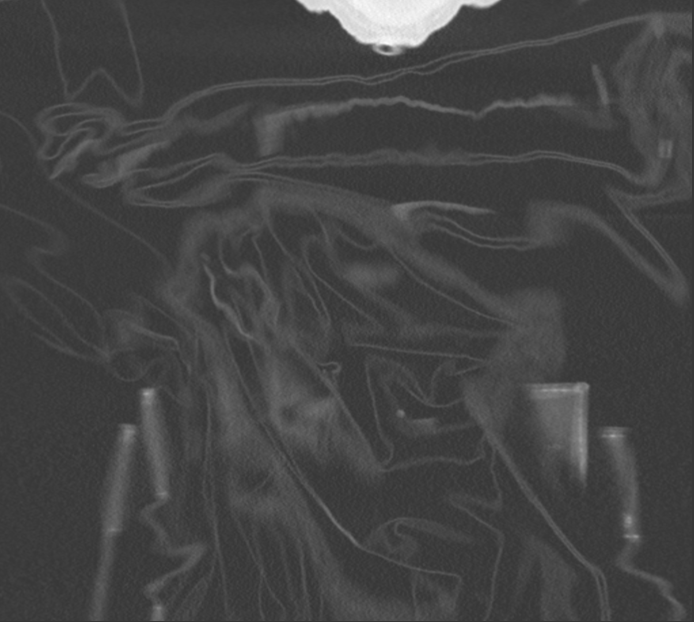
[im 48/119  lung]
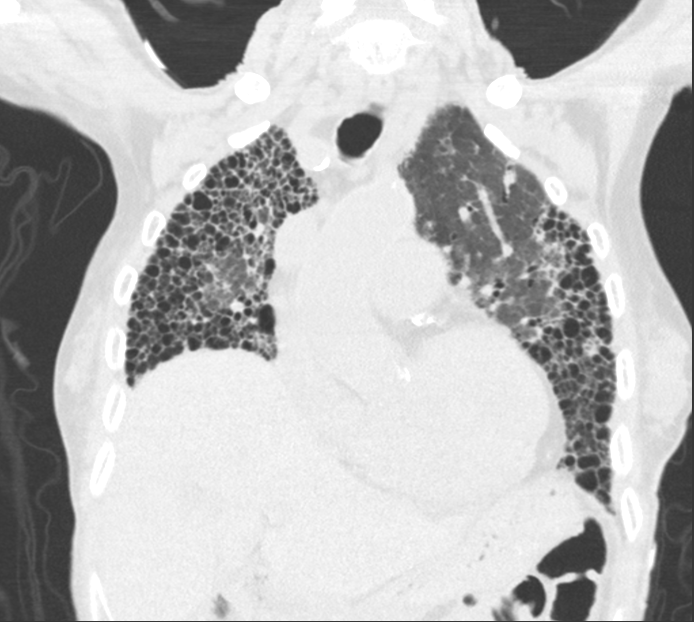
[im 71/119  lung]
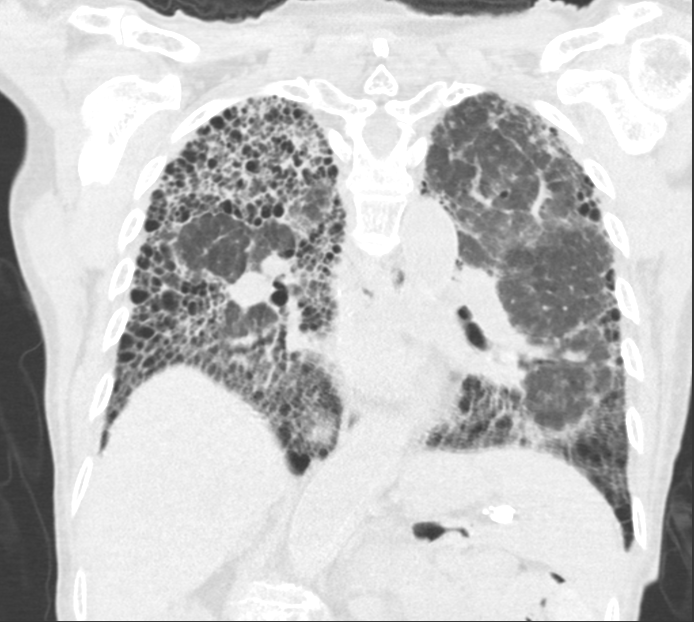

[12 of 36 positions shown; findings below may reference images not displayed]

FINDINGS: CT CHEST FINDINGS

Cardiovascular: Heart size is normal. There is no significant
pericardial fluid, thickening or pericardial calcification. There is
aortic atherosclerosis, as well as atherosclerosis of the great
vessels of the mediastinum and the coronary arteries, including
calcified atherosclerotic plaque in the left main, left anterior
descending, left circumflex and coronary arteries. Calcifications of
the aortic valve.

Mediastinum/Nodes: No pathologically enlarged mediastinal or hilar
lymph nodes. Multiple densely calcified right hilar and mediastinal
lymph nodes are incidentally noted. Esophagus is unremarkable in
appearance. No axillary lymphadenopathy.

Lungs/Pleura: Again noted is extensive subpleural reticulation,
parenchymal banding, traction bronchiectasis and frank honeycombing.
Findings are essentially unchanged compared to prior study from
10/09/2015, and are again considered diagnostic of usual
interstitial pneumonia (UIP) from an imaging perspective. No acute
consolidative airspace disease. No pleural effusions.

Musculoskeletal: There are no aggressive appearing lytic or blastic
lesions noted in the visualized portions of the skeleton.

CT ABDOMEN AND PELVIS FINDINGS

Hepatobiliary: Sub cm low-attenuation lesion in segment 4A of the
liver is too small to definitively characterize, but is
statistically likely a tiny cyst. No other suspicious hepatic
lesions are noted. No intra or extrahepatic biliary ductal
dilatation. Gallbladder is normal in appearance.

Pancreas: No pancreatic mass. No pancreatic ductal dilatation. No
pancreatic or peripancreatic fluid or inflammatory changes.

Spleen: Unremarkable.

Adrenals/Urinary Tract: A 2.1 cm simple cyst in the interpolar
region of the right kidney. Sub cm low-attenuation lesion in the
interpolar region of the left kidney is too small to characterize,
but is statistically likely a tiny cyst. Bilateral adrenal glands
are normal in appearance. No hydroureteronephrosis. Urinary bladder
is normal in appearance.

Stomach/Bowel: The appearance of the stomach is normal. There is no
pathologic dilatation of small bowel or colon. Numerous colonic
diverticulae are noted, without surrounding inflammatory changes to
suggest an acute diverticulitis at this time.

Vascular/Lymphatic: Aortic atherosclerosis, without evidence of
aneurysm or dissection in the abdominal or pelvic vasculature. No
lymphadenopathy noted in the abdomen or pelvis.

Reproductive: Uterus and ovaries are atrophic.

Other: No significant volume of ascites.  No pneumoperitoneum.

Musculoskeletal: Severe dextroscoliosis of the lumbar spine convex
to the right at the level of L2. Multilevel degenerative disc
disease and lumbar spondylosis. There are no aggressive appearing
lytic or blastic lesions noted in the visualized portions of the
skeleton.
IMPRESSION: 1. No significant change in the appearance of the chest compared to
recent prior high-resolution chest CT 10/26/2015, with imaging
findings which remain compatible with usual interstitial pneumonia.
2. No acute findings in the abdomen or pelvis to account for the
patient's symptoms.
3. Severe colonic diverticulosis without evidence of acute
diverticulitis at this time.
4. Aortic atherosclerosis, in addition to left main and 3 vessel
coronary artery disease.
5. There are calcifications of the aortic valve. Echocardiographic
correlation for evaluation of potential valvular dysfunction may be
warranted if clinically indicated.
6. Additional incidental findings, as above.

## 2018-02-04 IMAGING — CR DG CHEST 1V PORT
1 series · 1 of 1 positions shown · non-contrast
Comparison: Chest CT 12/01/2015

CLINICAL DATA: Worsening shortness of breath today. History of
pulmonary fibrosis.

EXAM:
PORTABLE CHEST 1 VIEW

[AP]
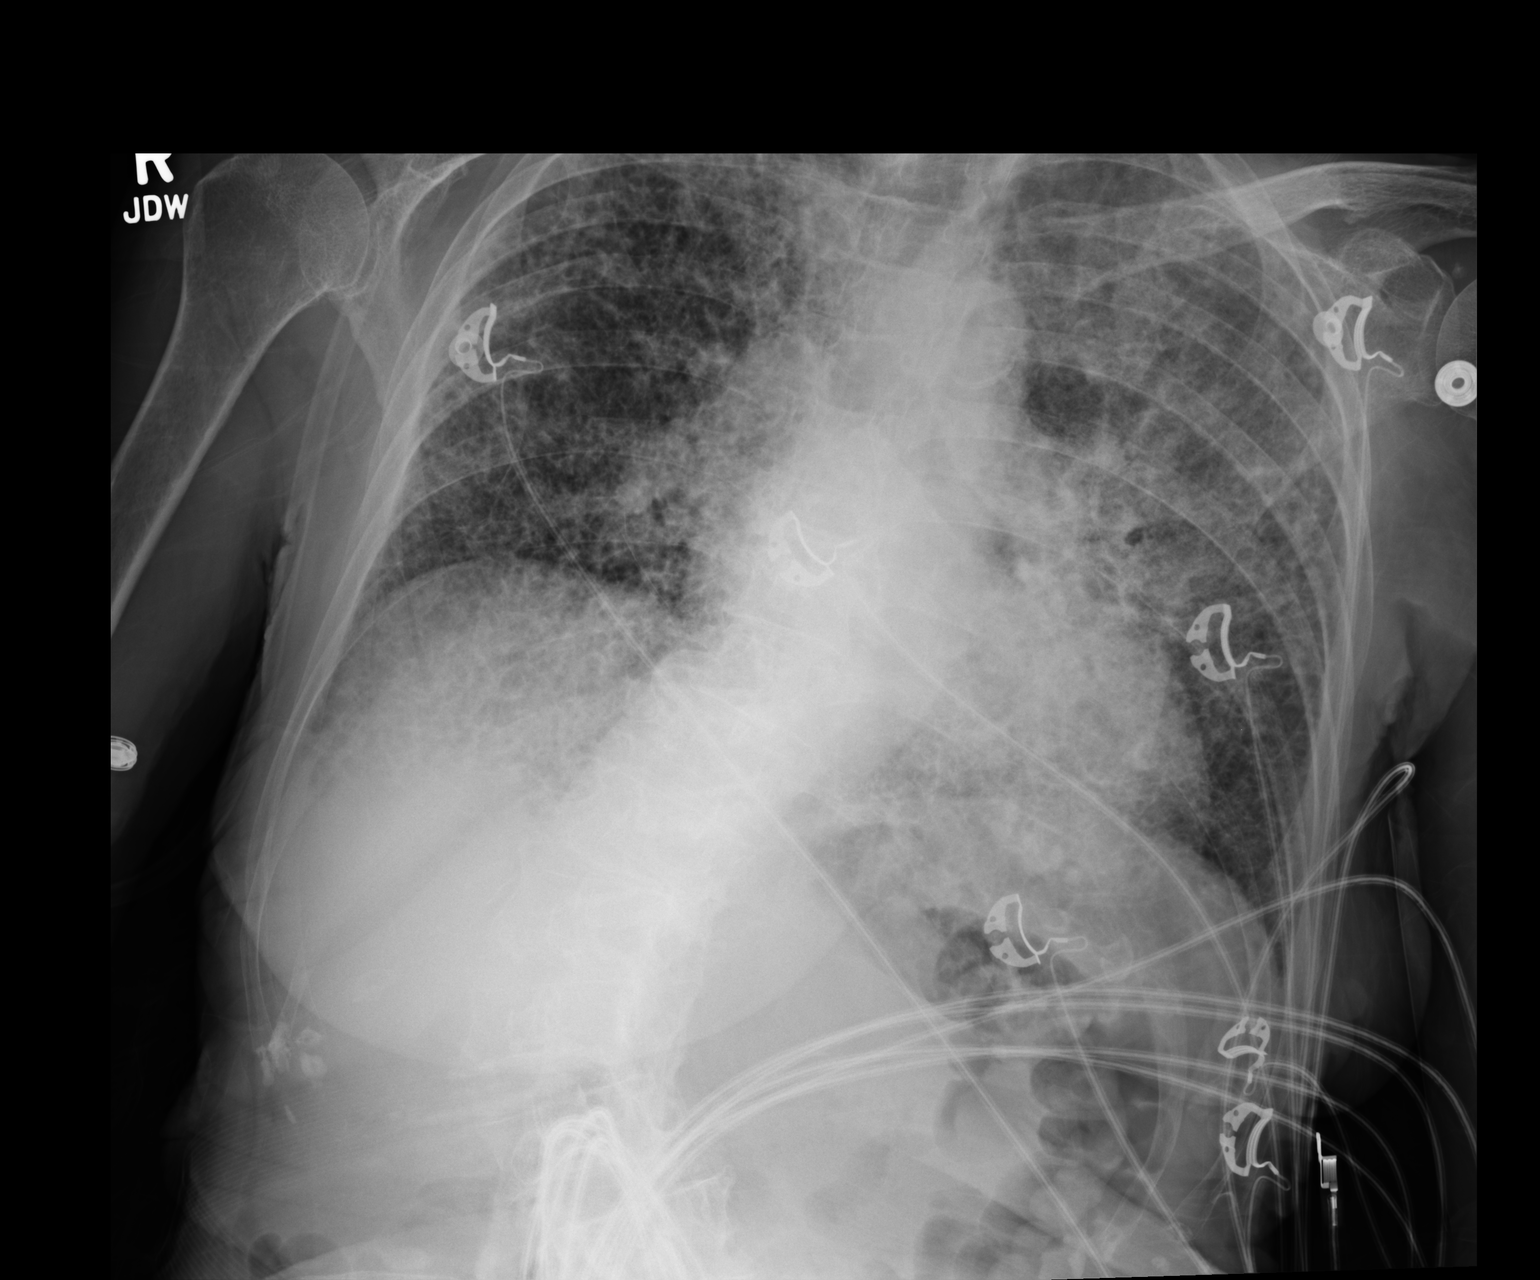

[1 of 1 positions shown; findings below may reference images not displayed]

FINDINGS: The cardiac silhouette, mediastinal and hilar contours are within
normal limits and stable. There is mild tortuosity and calcification
of the thoracic aorta. Stable severe chronic interstitial lung
disease/ pulmonary fibrosis without definite acute overlying
pulmonary process.
IMPRESSION: Severe chronic lung disease but no definite acute overlying
pulmonary findings.

## 2023-12-10 NOTE — Progress Notes (Signed)
 This encounter was created in error - please disregard.
